# Patient Record
Sex: Male | Born: 1940 | Race: White | Hispanic: No | Marital: Married | Smoking: Former smoker
Health system: Southern US, Community
[De-identification: ages and names within clinical notes are randomized; demographics above are authoritative.]

## PROBLEM LIST (undated history)

## (undated) DIAGNOSIS — Z5189 Encounter for other specified aftercare: Secondary | ICD-10-CM

## (undated) DIAGNOSIS — N529 Male erectile dysfunction, unspecified: Secondary | ICD-10-CM

## (undated) DIAGNOSIS — I2510 Atherosclerotic heart disease of native coronary artery without angina pectoris: Secondary | ICD-10-CM

## (undated) DIAGNOSIS — K859 Acute pancreatitis without necrosis or infection, unspecified: Secondary | ICD-10-CM

## (undated) DIAGNOSIS — E785 Hyperlipidemia, unspecified: Secondary | ICD-10-CM

## (undated) DIAGNOSIS — K219 Gastro-esophageal reflux disease without esophagitis: Secondary | ICD-10-CM

## (undated) DIAGNOSIS — F0390 Unspecified dementia without behavioral disturbance: Secondary | ICD-10-CM

## (undated) DIAGNOSIS — D509 Iron deficiency anemia, unspecified: Secondary | ICD-10-CM

## (undated) DIAGNOSIS — J439 Emphysema, unspecified: Secondary | ICD-10-CM

## (undated) DIAGNOSIS — A048 Other specified bacterial intestinal infections: Secondary | ICD-10-CM

## (undated) DIAGNOSIS — I10 Essential (primary) hypertension: Secondary | ICD-10-CM

## (undated) DIAGNOSIS — K449 Diaphragmatic hernia without obstruction or gangrene: Secondary | ICD-10-CM

## (undated) DIAGNOSIS — K2970 Gastritis, unspecified, without bleeding: Secondary | ICD-10-CM

## (undated) DIAGNOSIS — E538 Deficiency of other specified B group vitamins: Secondary | ICD-10-CM

## (undated) DIAGNOSIS — N200 Calculus of kidney: Secondary | ICD-10-CM

## (undated) DIAGNOSIS — IMO0001 Reserved for inherently not codable concepts without codable children: Secondary | ICD-10-CM

## (undated) DIAGNOSIS — E669 Obesity, unspecified: Secondary | ICD-10-CM

## (undated) DIAGNOSIS — F17200 Nicotine dependence, unspecified, uncomplicated: Secondary | ICD-10-CM

## (undated) DIAGNOSIS — K5790 Diverticulosis of intestine, part unspecified, without perforation or abscess without bleeding: Secondary | ICD-10-CM

## (undated) HISTORY — DX: Gastritis, unspecified, without bleeding: K29.70

## (undated) HISTORY — DX: Obesity, unspecified: E66.9

## (undated) HISTORY — DX: Hyperlipidemia, unspecified: E78.5

## (undated) HISTORY — DX: Deficiency of other specified B group vitamins: E53.8

## (undated) HISTORY — DX: Iron deficiency anemia, unspecified: D50.9

## (undated) HISTORY — DX: Essential (primary) hypertension: I10

## (undated) HISTORY — DX: Diaphragmatic hernia without obstruction or gangrene: K44.9

## (undated) HISTORY — DX: Nicotine dependence, unspecified, uncomplicated: F17.200

## (undated) HISTORY — DX: Emphysema, unspecified: J43.9

## (undated) HISTORY — PX: KNEE CARTILAGE SURGERY: SHX688

## (undated) HISTORY — DX: Encounter for other specified aftercare: Z51.89

## (undated) HISTORY — DX: Diverticulosis of intestine, part unspecified, without perforation or abscess without bleeding: K57.90

## (undated) HISTORY — DX: Other specified bacterial intestinal infections: A04.8

## (undated) HISTORY — DX: Gastro-esophageal reflux disease without esophagitis: K21.9

## (undated) HISTORY — DX: Atherosclerotic heart disease of native coronary artery without angina pectoris: I25.10

## (undated) HISTORY — DX: Unspecified dementia without behavioral disturbance: F03.90

## (undated) HISTORY — DX: Reserved for inherently not codable concepts without codable children: IMO0001

## (undated) HISTORY — DX: Calculus of kidney: N20.0

## (undated) HISTORY — DX: Male erectile dysfunction, unspecified: N52.9

## (undated) HISTORY — DX: Acute pancreatitis without necrosis or infection, unspecified: K85.90

## (undated) HISTORY — PX: COLONOSCOPY: SHX174

---

## 1999-01-19 ENCOUNTER — Encounter: Payer: Self-pay | Admitting: Cardiology

## 1999-01-19 ENCOUNTER — Ambulatory Visit (HOSPITAL_COMMUNITY): Admission: RE | Admit: 1999-01-19 | Discharge: 1999-01-20 | Payer: Self-pay | Admitting: Cardiology

## 1999-01-19 HISTORY — PX: CARDIAC CATHETERIZATION: SHX172

## 1999-09-02 ENCOUNTER — Inpatient Hospital Stay (HOSPITAL_COMMUNITY): Admission: EM | Admit: 1999-09-02 | Discharge: 1999-09-05 | Payer: Self-pay | Admitting: *Deleted

## 1999-09-02 ENCOUNTER — Encounter: Payer: Self-pay | Admitting: *Deleted

## 1999-09-03 ENCOUNTER — Encounter: Payer: Self-pay | Admitting: *Deleted

## 1999-09-03 ENCOUNTER — Encounter: Payer: Self-pay | Admitting: Family Medicine

## 1999-09-06 ENCOUNTER — Encounter: Admission: RE | Admit: 1999-09-06 | Discharge: 1999-09-06 | Payer: Self-pay | Admitting: Family Medicine

## 2003-01-25 LAB — HM COLONOSCOPY

## 2009-12-18 ENCOUNTER — Ambulatory Visit: Payer: Self-pay | Admitting: Cardiology

## 2009-12-18 ENCOUNTER — Encounter (INDEPENDENT_AMBULATORY_CARE_PROVIDER_SITE_OTHER): Payer: Self-pay | Admitting: *Deleted

## 2009-12-18 DIAGNOSIS — I2510 Atherosclerotic heart disease of native coronary artery without angina pectoris: Secondary | ICD-10-CM | POA: Insufficient documentation

## 2009-12-19 ENCOUNTER — Encounter: Payer: Self-pay | Admitting: Cardiology

## 2010-02-08 NOTE — Letter (Signed)
Summary: [facility]  [facility]   Imported By: Faythe Ghee 12/19/2009 10:24:18  _____________________________________________________________________  External Attachment:    Type:   Image     Comment:   External Document

## 2010-02-08 NOTE — Letter (Signed)
Summary: Clearance Letter  [facility]  [address] [place] [address]   Phone: [phone]  Fax: [phone]    December 18, 2009  Re:     [facility] Address:   [address]     [place], [place]  [address] DOB:     [birth year] MRN:     [id number]   Dear Dr. Arthur Holms:   Mr. Moccia was in the office for a visit on 12/18/2009.  Is has been   given cardiology clearance for his orthroscopic knee surgery with low   cardiac risk.  Please feel free to consult [place] further if needed during   his surgical procedure.           Sincerely,    Dr. Valera Castle  MD, [facility]

## 2010-02-08 NOTE — Assessment & Plan Note (Signed)
Summary: **np6/surgical clearance/knee surgery/   Visit Type:  Pre-op Evaluation Primary Provider:  Dr.Mary beth dickson  CC:  right knee surgery Dr.Noris.  History of Present Illness: Mr Pang he is referred today by Dr. Ranell Patrick of orthopedic surgery for preoperative clearance 4 right arthroscopic knee surgery.  He is 70 years of age. He is very active playing golf. He has a history of coronary artery disease by catheterization January 19, 1999. His ejection fraction at that time was 55%, he had proximal and mid calcification of the LAD with nonobstructive plaque, subtotal occlusion of the distal segment of the circumflex before long posterior lateral branch, and the right coronary artery was dominant and free of significant disease. He is been managed medically all these years and has done remarkably well. He has had no angina or ischemic equivalence. He continues to smoke an occasional cigar when playing golf. He is overweight slightly, has hyperlipidemia but is on a statin, and has diabetes well controlled. He does not carry or use any sublingual nitroglycerin.  He denies orthopnea, PND or edema. He denies any palpitations presyncope or syncope.  Current Medications (verified): 1)  Aspirin 325 Mg Tabs (Aspirin) .... Take 1 Tab Daily 2)  Lipitor 80 Mg Tabs (Atorvastatin Calcium) .... Take 1 Tab Daily 3)  Enalapril Maleate 20 Mg Tabs (Enalapril Maleate) .... Take 1 Tab Daily 4)  Glucophage 500 Mg Tabs (Metformin Hcl) .... Take 1 Tab Two Times A Day 5)  Protonix 40 Mg Tbec (Pantoprazole Sodium) .... Take 1 Tab Daily 6)  Glipizide 10 Mg Tabs (Glipizide) .... Take 1 Tab Daily 7)  Diovan 320 Mg Tabs (Valsartan) .... Take 1 Tab Daily 8)  Actos 45 Mg Tabs (Pioglitazone Hcl) .... Take 1tab Daily 9)  Amlodipine Besylate 10 Mg Tabs (Amlodipine Besylate) .... Take 1 Tab Daily 10)  Proair Hfa 108 (90 Base) Mcg/act Aers (Albuterol Sulfate) .... Use As Needed 11)  Hydroxyzine Hcl 50 Mg Tabs  (Hydroxyzine Hcl) .... Take As Needed 12)  Nitrostat 0.4 Mg Subl (Nitroglycerin) .Marland Kitchen.. 1 Tablet Under Tongue At Onset of Chest Pain; You May Repeat Every 5 Minutes For Up To 3 Doses.  Allergies (verified): No Known Drug Allergies  Past History:  Past Medical History: Last updated: December 23, 2009 diabetes hypercholesterolemia copd obesity nephrolithiasis heart cath(one vessel coronary artery disease) gerds copd  Past Surgical History: Last updated: 23-Dec-2009 heart catherization one vessel coronary artery disease  Family History: Last updated: 23-Dec-2009 Father:died of emphysemia Mother:alive 2 myocardial  infarctions coronary artery bypass graft   Social History: Last updated: 2009/12/23 Full Time Married  Tobacco Use - Yes.  Alcohol Use - no Regular Exercise - no Drug Use - no  Risk Factors: Exercise: no (23-Dec-2009)  Risk Factors: Smoking Status: current (12-23-2009)  Review of Systems       negative other than history of present illness  Vital Signs:  Patient profile:   70 year old male Height:      69 inches Weight:      209 pounds BMI:     30.98 O2 Sat:      97 % on Room air Pulse rate:   99 / minute BP sitting:   139 / 72  (right arm)  Vitals Entered By: Dreama Saa, CNA (2009-12-23 12:30 PM)  O2 Flow:  Room air  Physical Exam  General:  no acute distress, slightly overweight Head:  normocephalic and atraumatic Eyes:  PERRLA/EOM intact; conjunctiva and lids normal. Chest Wall:  no deformities  or breast masses noted Lungs:  Clear bilaterally to auscultation and percussion. Heart:  PMI not displaced, normal S1-S2, no murmur rub or gallop. No carotid bruits Abdomen:  soft, good bowel sounds, no tenderness, no midline or flank bruit Msk:  decreased ROM.   Pulses:  pulses normal in all 4 extremities Extremities:  No clubbing or cyanosis. Neurologic:  Alert and oriented x 3. Skin:  Intact without lesions or rashes. Psych:  Normal  affect.   Problems:  Medical Problems Added: 1)  Dx of Cad, Native Vessel  (ICD-414.01)  EKG  Procedure date:  12/18/2009  Findings:      normal sinus rhythm, normal EKG  Impression & Recommendations:  Problem # 1:  CAD, NATIVE VESSEL (ICD-414.01) He has been remarkably stable since 2001. He has no symptoms of angina or ischemia. He is secondary risk factors being well treated. He does occasionally have a cigar. I've asked her to carry sublingual nitroglycerin or at least keep at home. We have spent a lot of time talking about symptoms of exertional angina, unstable angina, or acute coronary syndrome and how to manage it with nitroglycerin and aspirin as well as calling 911. I have cleared him at low operative risk for surgery. He may discontinue his aspirin for the procedure. He should start back shortly thereafter. I'll see him back on an annual basis. His updated medication list for this problem includes:    Aspirin 325 Mg Tabs (Aspirin) .Marland Kitchen... Take 1 tab daily    Enalapril Maleate 20 Mg Tabs (Enalapril maleate) .Marland Kitchen... Take 1 tab daily    Amlodipine Besylate 10 Mg Tabs (Amlodipine besylate) .Marland Kitchen... Take 1 tab daily    Nitrostat 0.4 Mg Subl (Nitroglycerin) .Marland Kitchen... 1 tablet under tongue at onset of chest pain; you may repeat every 5 minutes for up to 3 doses.  Patient Instructions: 1)  Your physician recommends that you schedule a follow-up appointment in: 1 year 2)  Your physician has recommended you make the following change in your medication: nitroglycerin   1 tablet under tongue at onset of chest pain; you may repeat every 5 minutes for up to 3 doses. Prescriptions: NITROSTAT 0.4 MG SUBL (NITROGLYCERIN) 1 tablet under tongue at onset of chest pain; you may repeat every 5 minutes for up to 3 doses.  #25 x 1   Entered by:   Teressa Lower RN   Authorized by:   Gaylord Shih, MD, [facility]   Signed by:   Teressa Lower RN on 12/18/2009   Method used:   Faxed to ...       [facility]       [address]       [place], [place]  [address]       Ph: [phone] or [phone]       Fax: [phone]   RxID:   [id number]

## 2010-04-04 ENCOUNTER — Encounter: Payer: Self-pay | Admitting: Physician Assistant

## 2010-04-04 DIAGNOSIS — E559 Vitamin D deficiency, unspecified: Secondary | ICD-10-CM | POA: Insufficient documentation

## 2010-05-08 DIAGNOSIS — K2970 Gastritis, unspecified, without bleeding: Secondary | ICD-10-CM

## 2010-05-08 DIAGNOSIS — A048 Other specified bacterial intestinal infections: Secondary | ICD-10-CM

## 2010-05-08 DIAGNOSIS — K449 Diaphragmatic hernia without obstruction or gangrene: Secondary | ICD-10-CM

## 2010-05-08 HISTORY — DX: Gastritis, unspecified, without bleeding: K29.70

## 2010-05-08 HISTORY — DX: Diaphragmatic hernia without obstruction or gangrene: K44.9

## 2010-05-08 HISTORY — DX: Other specified bacterial intestinal infections: A04.8

## 2010-05-25 NOTE — Discharge Summary (Signed)
[place]. [facility]  Patient:    Jonathan [name], Jonathan [name]                      MRN: [id number] Adm. Date:  [id number] Disc. Date: [id number] Attending:  Willow Ora Dictator:   Kinnie Scales. Reed Breech, M.D. CC:         Elvina Sidle, M.D.                           Discharge Summary  DISCHARGE DIAGNOSES: 1. Pancreatitis. 2. Diabetes. 3. Back pain. 4. Hypercholesterolemia. 5. Chronic obstructive pulmonary disease. 6. Nephrolithiasis. 7. History of one-vessel coronary artery disease with ejection fraction of    55%. 8. Family history of coronary artery disease.  DISCHARGE MEDICATIONS: 1. Percocet one p.o. q.4-6h. p.r.n. pain. 2. Lipitor 20 mg one p.o. q.d. 3. Vasotec 5 mg p.o. q.d. 4. Glucophage 500 mg p.o. b.i.d. 5. Aspirin 325 mg p.o. q.d. 6. Protonix 40 mg one p.o. q.d.  CONSULTATIONS:  None.  PROCEDURES: 1. Abdominal CT showing edematous pancreas, node dissection. 2. Abdominal ultrasound showed no gallstones or other biliary abnormalities,    small segment of head/body pancreas were visualized, appeared to be    unremarkable by ultrasound.  FOLLOWUP:  The patient will be seen at [facility] in the next one to two weeks by Dr. Milus Glazier or Christell Constant.  ADMISSION HISTORY AND PHYSICAL:  The patient is a 70 year old white male with a past medical history of hypercholesterolemia, diabetes, asthma/COPD, obesity, and cigarette abuse presented with a 30 hour history of very sharp back pain with some mild epigastric discomfort.  While being worked up the patient was noted to have elevated lipase at 240, and was noted on physical exam to be in quite severe pain.  The patient received morphine in the emergency room, and this caused the pain to subside.  The patient denied any chest pain, discomfort, dyspnea.  The patient was admitted with the diagnosis of pancreatitis.  HOSPITAL COURSE:  #1 - BACK/ABDOMINAL PAIN:  The patients pain continued to be  problematic on day #2 of hospitalization, and thus a CT scan was performed to rule out a possible dissection or small bowel perforation.  The patient was noted to have nephrolithiasis, but asymptomatic from this.  Since the patient does take Glucophage he was instructed not to take this 2 to 3 days after getting his IV contrast.  The patient was kept n.p.o. until the night prior to discharge.  His diet was advanced.  He tolerated this well, not requiring much pain medication, and he tolerated a full diet well.  The patient will be discharged with pain medication.  #2 - DIABETES/HYPERTENSION:  The patient takes Vasotec, aspirin, and Glucophage.  Initially these were held while hospitalized.  Aspirin and Vasotec were restarted.  The patient is to continue these as an outpatient.  #3 - DIABETES:  The patient takes Glucophage 500 mg b.i.d. if tolerable.  The patient should have this advanced to 1500 or 2000 to find better benefit.  The patients hemoglobin A1C was found to be 7.0.  #4 - HYPERLIPIDEMIA:  A lipid panel fasting was performed and total cholesterol was 172, triglycerides were 279, HDL was low at 32, and LDL was 84.  #5 - BACK PAIN/CHEST PAIN:  The patient had initial troponin which was less than 0.03, which is normal limits.  It was deemed that he was not having a recurrence  of his coronary artery disease and chest pain.  As far as feeds, the patient tolerated diet well.  Ready to go home.  FOLLOWUP:  Dr. Milus Glazier as an outpatient. DD:  09/05/99 TD:  09/06/99 Job: [id number]

## 2010-05-25 NOTE — Discharge Summary (Signed)
[place]. [facility]  Patient:    Jonathan [name]                       MRN: [id number] Adm. Date:  [id number] Disc. Date: 01/20/99 Attending:  Rollene Rotunda Dictator:   Dian Queen, P.A.C. [facility] CC:         Dr. Rudi Heap, [facility], [facility]                  Referring Physician Discharge [place]  HISTORY OF PRESENT ILLNESS:  Essentially, is that of a 70 year old white male, private patient of Dr. Christell Constant, who saw Dr. Antoine Poche in January 2001 with chest discomfort, suspicious for exertional angina.  Cardiac catheterization was suggested, and he was brought in for such on January 19, 1999.  This was done by Dr. Antoine Poche without complication, and revealed a totally occluded distal circumflex, 25% LAD in the proximal and mid portion.  The right coronary artery was nondominant.  His ejection fraction was well-preserved at 55%.  Dr. Gerri Spore attempted to cross the lesion but could not pass the wire, and therefore, intervention was impossible.  We therefore opted for medical therapy and risk factor modification.  Patient tolerated the procedure well, and was discharged home the following day.  FINAL DIAGNOSES: 1. One-vessel coronary artery disease (totalled circumflex - we attempted    recanalization but could not pass the wire and therefore plan medical    therapy). 2. Diabetes. 3. Cigarette use. 4. Hyperlipoproteinemia - on Lipitor.  DISPOSITION:  MEDICATIONS:  We added Vasotec 5 mg b.i.d. to his regimen.  He will use nitroglycerin p.r.n.  Hopefully, with time, he will develop collaterals.  FOLLOW-UP:  With Dr. Antoine Poche in a couple weeks, and thereafter with Dr. Vernon Prey. DD:  01/20/99 TD:  01/20/99 Job: [id number]

## 2010-05-25 NOTE — Cardiovascular Report (Signed)
[place]. [facility]  Patient:    Jonathan [name]                       MRN: [id number] Proc. Date: 01/19/99 Adm. Date:  [id number] Attending:  Rollene Rotunda CC:         Bertram Millard. Hyacinth Meeker, M.D.             Rollene Rotunda, M.D. [facility]             Cardiac Catheterization Laboratory                        Cardiac Catheterization  PROCEDURES PERFORMED:  Attempted percutaneous transluminal coronary angioplasty of the distal left circumflex.  INDICATIONS:  [name] is a 70 year old male with recent onset of exertional  angina.  Cardiac catheterization today by Dr. Antoine Poche revealed the presence of a total occlusion in the distal dominant left circumflex just before a posterior descending artery.  The length of the occlusion appeared to be approximately 20 mm. There was a posterolateral branch arising just proximal to the occlusion. After reviewing the films, we opted to proceed with an attempt at percutaneous intervention.  DESCRIPTION OF PROCEDURE:  A preexisting 6 French sheath in the right femoral artery was exchanged over a wire for a 7 [place] sheath.  A 7 [place] guiding  catheter was engaged in the left coronary ostium.  We initially attempted to cross a lesion with a BMW wire.  We used a 2.5 x 20 mm Ninja balloon as backup support for the wire.  This wire would not cross the lesion.  We then used a Choice PT ire which also failed to cross the lesion.  Finally, we used a Choice PT Graphix intermediate wire, again with backup support with the angioplasty balloon and this also failed to cross the lesion.  At that point we opted to end the procedure. The patient tolerated this well and there were no complications.  At the conclusion of the case, a Perclose vascular closure device was placed in the right femoral artery.  Heparin and Integrilin had been administered per protocol.  Both of these were stopped at the conclusion of the  procedure.  COMPLICATIONS:  None.  RESULTS:  Unsuccessful attempted angioplasty of the distal circumflex due to inability to cross a totally occluded vessel with a wire.  PLAN:  The patient will be observed overnight.  He will be treated medically for his one-vessel coronary artery disease. DD:  01/19/99 TD:  01/20/99 Job: [id number]

## 2010-05-25 NOTE — Cardiovascular Report (Signed)
[name]. [facility]  Patient:    Jonathan [name]                       MRN: [id number] Proc. Date: 01/19/99 Adm. Date:  [id number] Attending:  Rollene Rotunda CC:         Bertram Millard. Hyacinth Meeker, M.D.             Rollene Rotunda, M.D. [facility]                        Cardiac Catheterization  DATE OF BIRTH:  [birth year]  PRIMARY PHYSICIAN:  Dr. Bertram Millard. [facility].  CARDIOLOGIST:  Dr. Antoine Poche.  PROCEDURE:  Left heart cardiac catheterization, coronary arteriography.  INDICATIONS:  Evaluate patient with exertional chest pain and multiple cardiovascular risk factors (786.5).  DESCRIPTION OF PROCEDURE:  Left heart catheterization was performed via the right femoral artery.  The artery was cannulated using an anterior wall puncture.  A 6 French arterial sheath was inserted via the modified Seldinger technique. Preformed Judkins and a pigtail catheter were utilized.  The patient tolerated he procedure well and left the lab in stable condition.  RESULTS:  HEMODYNAMICS:  LV 157/16, AO 158/73.  CORONARY ARTERIOGRAPHY:  Left main:  The left main coronary artery was normal.  Left anterior descending:  The LAD had tortuous proximal and mid segment with calcification.  There was an ostial 25% stenosis and a mid 25% stenosis.  Circumflex:  The circumflex coronary artery was a large dominant vessel.  It was subtotaled in distal segment before a long posterolateral.  Right coronary artery:  The right coronary artery was nondominant and free of significant disease.  LEFT VENTRICULOGRAM:  The left ventriculogram was obtained in the RAO projection. The EF was approximately 55%.  CONCLUSION:  Severe single-vessel coronary artery disease of the subtotaled circumflex.  There was also moderate plaquing and calcification in the mid LAD.   PLAN:  The patient will have percutaneous revascularization of his circumflex. He will have  aggressive secondary risk factor modification. DD:  01/19/99 TD:  01/20/99 Job: [id number]

## 2010-05-25 NOTE — H&P (Signed)
[place]. [facility]  Patient:    Jonathan [name], Jonathan [name]                      MRN: [id number] Adm. Date:  [id number] Attending:  Willow Ora Dictator:   Kinnie Scales. Reed Breech, M.D. CC:         Monica Becton, M.D., [facility]                         History and Physical  CHIEF COMPLAINT:  Back pain.  HISTORY OF PRESENT ILLNESS:  This 70 year old white male with a past medical history of hypercholesterolemia, diabetes, asthma and obesity and a long history of cigarette abuse, presents today with a one-day history of sharp pain in his abdomen and upper back.  He said the pain also is in left and right lower quadrants at times.  He said the pain does not change with movement and there is nothing he has found to relieve the pain.  He denies emesis, diarrhea -- no melena or hematochezia -- but he has had nausea.  He denies any trauma or motor vehicle collision to this area and he denies any alcohol abuse.  He states that he had a recent checkup with Dr. Rollene Rotunda from cardiology and all his labs at that time were normal.  He was told to come back in a year.  Patient underwent a catheterization in January 2001 and noted to have one-vessel coronary artery disease.  He denies any cold or flu symptoms recently.  He denies any dysuria or fever.  ALLERGIES:  No known medical allergies.  SURGICAL HISTORY:  None.  PAST MEDICAL HISTORY:  Diabetes; hypercholesterolemia; COPD; obesity; nephrolithiasis; heart catheterization, one-vessel coronary artery disease, left circumflex, ejection fraction 55% in January 2001.  Patient has GERD.  SOCIAL HISTORY:  Patient is married, is a greater than 60-pack-year-history smoker, denies any alcohol use and works as a Production designer, theatre/television/film person at a [employer].  FAMILY HISTORY:  Mother had MIs x 2 and underwent CABG.  Three uncles with heart disease and MIs.  Brother had an MI.  His father died of emphysema  and diabetes mellitus.  MEDICATIONS 1. Aspirin 325 mg p.o. q.d. 2. Lipitor 20 mg p.o. q.d. 3. Vasotec 5 mg p.o. q.d. 4. Glucophage 500 mg p.o. b.i.d.  REVIEW OF SYSTEMS:  HEENT, respiratory and cardiac all negative, as mentioned above.  ABDOMEN:  Positive nausea.  No vomiting.  No distention or blood per rectum.  EXTREMITIES:  No muscle myalgias, weakness or edema.  PHYSICAL EXAMINATION  VITAL SIGNS:  Blood pressure 149/96.  Pulse 65.  Respiratory rate 18. Temperature 97.9.  O2 saturation 94% on room air.  GENERAL:  Alert, in no acute distress.  HEENT:  PERRL.  Oropharynx nonerythematous.  NECK:  No lymphadenopathy.  CHEST:  Clear to auscultation bilaterally.  HEART:  Regular rate and rhythm without murmurs.  ABDOMEN:  Soft, nondistended.  Positive bowel sounds.  Slightly tender in epigastric region of stomach.  Negative Turner and Cullen signs.  Positive bowel sounds.  No rebound tenderness.  No hepatosplenomegaly.  EXTREMITIES:  No cyanosis, clubbing or edema.  LABORATORY AND X-RAY FINDINGS:  Chest x-ray showed no acute disease, COPD changes.  WBC 14.7, hemoglobin 14.7, platelets 401,000.  Sodium 135, potassium 3.7, chloride 102, bicarb 25, BUN 15, creatinine 0.9, platelets 148, calcium 9.0, total protein 7.1, albumin 3.7, AST 12, ALT 16, alkaline phosphatase  92, total bilirubin 0.3.  Lipase 240.  Troponin I 0.03.  ASSESSMENT:  This 70 year old male with diabetes, one-vessel coronary artery disease and apparent acute pancreatitis. 1. Abdominal pain, most likely pancreatitis secondary to cholelithiasis, but    not uncommon with diabetes, hypercholesterolemia.  Patient denies any    alcohol use.  Other possibilities could be a viral pancreatitis versus drug    induced or hypertriglyceridemia, but these are less likely.  Other    considerations are peptic ulcer disease, nephrolithiasis, inferior    myocardial infarction, lower lobe pneumonia, but unlikely to be a     gastrointestinal tract obstruction, small bowel ______ and positive bowel    sounds.  Will make patient n.p.o., order abdominal ultrasound, fasting    lipid profile and recheck his laboratory data in the morning for further    evaluation of this.  Will also treat him with Protonix intravenously. 2. Diabetes:  We will hold off on his Glucophage while he is n.p.o. and start    his A1c to determine if he needs acceleration of his Glucophage. 3. Chronic obstructive pulmonary disease:  Will continue his albuterol and    start him on Atrovent. 4. Will check a fasting lipid profile. 5. Fluids/electrolytes/nutrition:  Will make him n.p.o. DD:  09/03/99 TD:  09/03/99 Job: [id number]

## 2010-06-02 ENCOUNTER — Inpatient Hospital Stay (HOSPITAL_COMMUNITY)
Admission: EM | Admit: 2010-06-02 | Discharge: 2010-06-03 | DRG: 812 | Disposition: A | Discharge status: Home / Self Care | Payer: Medicare Other | Attending: Internal Medicine | Admitting: Internal Medicine

## 2010-06-02 DIAGNOSIS — J4489 Other specified chronic obstructive pulmonary disease: Secondary | ICD-10-CM | POA: Diagnosis present

## 2010-06-02 DIAGNOSIS — E785 Hyperlipidemia, unspecified: Secondary | ICD-10-CM | POA: Diagnosis present

## 2010-06-02 DIAGNOSIS — D509 Iron deficiency anemia, unspecified: Secondary | ICD-10-CM

## 2010-06-02 DIAGNOSIS — E119 Type 2 diabetes mellitus without complications: Secondary | ICD-10-CM | POA: Diagnosis present

## 2010-06-02 DIAGNOSIS — Z9861 Coronary angioplasty status: Secondary | ICD-10-CM

## 2010-06-02 DIAGNOSIS — I2510 Atherosclerotic heart disease of native coronary artery without angina pectoris: Secondary | ICD-10-CM | POA: Diagnosis present

## 2010-06-02 DIAGNOSIS — E538 Deficiency of other specified B group vitamins: Secondary | ICD-10-CM

## 2010-06-02 DIAGNOSIS — F17200 Nicotine dependence, unspecified, uncomplicated: Secondary | ICD-10-CM | POA: Diagnosis present

## 2010-06-02 DIAGNOSIS — J449 Chronic obstructive pulmonary disease, unspecified: Secondary | ICD-10-CM | POA: Diagnosis present

## 2010-06-02 HISTORY — DX: Deficiency of other specified B group vitamins: E53.8

## 2010-06-02 HISTORY — DX: Iron deficiency anemia, unspecified: D50.9

## 2010-06-02 LAB — COMPREHENSIVE METABOLIC PANEL
ALT: 19 U/L (ref 0–53)
AST: 20 U/L (ref 0–37)
Albumin: 3.6 g/dL (ref 3.5–5.2)
CO2: 24 mEq/L (ref 19–32)
Chloride: 105 mEq/L (ref 96–112)
Creatinine, Ser: 1.06 mg/dL (ref 0.4–1.5)
GFR calc Af Amer: >60 mL/min (ref >60)
GFR calc non Af Amer: >60 mL/min (ref >60)
Potassium: 3.9 mEq/L (ref 3.5–5.1)
Sodium: 139 mEq/L (ref 135–145)
Total Bilirubin: 0.3 mg/dL (ref 0.3–1.2)

## 2010-06-02 LAB — GLUCOSE, CAPILLARY: Glucose-Capillary: 155 mg/dL — ABNORMAL HIGH (ref 70–99)

## 2010-06-02 LAB — URINALYSIS, ROUTINE W REFLEX MICROSCOPIC
Glucose, UA: NEGATIVE mg/dL
Hgb urine dipstick: NEGATIVE
Protein, ur: NEGATIVE mg/dL
Specific Gravity, Urine: 1.018 (ref 1.005–1.030)
pH: 5.5 (ref 5.0–8.0)

## 2010-06-02 LAB — FERRITIN: Ferritin: 5 ng/mL — ABNORMAL LOW (ref 22–322)

## 2010-06-02 LAB — CBC
Platelets: 589 10*3/uL — ABNORMAL HIGH (ref 150–400)
RDW: 19.8 % — ABNORMAL HIGH (ref 11.5–15.5)
WBC: 8.5 10*3/uL (ref 4.0–10.5)

## 2010-06-02 LAB — DIFFERENTIAL
Basophils Absolute: 0.1 10*3/uL (ref 0.0–0.1)
Lymphs Abs: 1.6 10*3/uL (ref 0.7–4.0)
Monocytes Absolute: 0.5 10*3/uL (ref 0.1–1.0)
Monocytes Relative: 6 % (ref 3–12)

## 2010-06-02 LAB — PROTIME-INR: Prothrombin Time: 12.5 seconds (ref 11.6–15.2)

## 2010-06-02 LAB — IRON AND TIBC
Iron: <10 ug/dL — ABNORMAL LOW (ref 42–135)
UIBC: >450 ug/dL

## 2010-06-03 ENCOUNTER — Encounter: Payer: Self-pay | Admitting: Internal Medicine

## 2010-06-03 DIAGNOSIS — D509 Iron deficiency anemia, unspecified: Secondary | ICD-10-CM | POA: Insufficient documentation

## 2010-06-03 DIAGNOSIS — D518 Other vitamin B12 deficiency anemias: Secondary | ICD-10-CM

## 2010-06-03 DIAGNOSIS — E538 Deficiency of other specified B group vitamins: Secondary | ICD-10-CM

## 2010-06-03 LAB — GLUCOSE, CAPILLARY: Glucose-Capillary: 78 mg/dL (ref 70–99)

## 2010-06-03 LAB — BASIC METABOLIC PANEL
CO2: 25 mEq/L (ref 19–32)
Chloride: 103 mEq/L (ref 96–112)
GFR calc non Af Amer: >60 mL/min (ref >60)
Glucose, Bld: 83 mg/dL (ref 70–99)
Potassium: 3.5 mEq/L (ref 3.5–5.1)
Sodium: 139 mEq/L (ref 135–145)

## 2010-06-03 LAB — HEMOGLOBIN A1C
Hgb A1c MFr Bld: 6.1 % — ABNORMAL HIGH (ref <5.7)
Mean Plasma Glucose: 128 mg/dL — ABNORMAL HIGH (ref <117)

## 2010-06-03 LAB — CBC
HCT: 29.7 % — ABNORMAL LOW (ref 39.0–52.0)
Hemoglobin: 8.9 g/dL — ABNORMAL LOW (ref 13.0–17.0)
Hemoglobin: 10.5 g/dL — ABNORMAL LOW (ref 13.0–17.0)
MCH: 22.3 pg — ABNORMAL LOW (ref 26.0–34.0)
MCHC: 30.6 g/dL (ref 30.0–36.0)
MCV: 71.9 fL — ABNORMAL LOW (ref 78.0–100.0)
MCV: 72.8 fL — ABNORMAL LOW (ref 78.0–100.0)
Platelets: 472 10*3/uL — ABNORMAL HIGH (ref 150–400)
RBC: 4.13 MIL/uL — ABNORMAL LOW (ref 4.22–5.81)
WBC: 10.7 10*3/uL — ABNORMAL HIGH (ref 4.0–10.5)

## 2010-06-03 LAB — LIPID PANEL
LDL Cholesterol: 62 mg/dL (ref 0–99)
Total CHOL/HDL Ratio: 2.9 RATIO

## 2010-06-03 LAB — TSH: TSH: 1.165 u[IU]/mL (ref 0.350–4.500)

## 2010-06-03 NOTE — H&P (Signed)
Jonathan [name], Jonathan [name]               ACCOUNT NO.:  [id number]  MEDICAL RECORD NO.:  [id number]           PATIENT TYPE:  I  LOCATION:  1530                         FACILITY:  [facility]  PHYSICIAN:  Isidor Holts, M.D.  DATE OF BIRTH:  11-28-1940  DATE OF ADMISSION:  06/02/2010 DATE OF DISCHARGE:                             HISTORY & PHYSICAL   PRIMARY MD:  Ernestina Penna, M.D.  PRIMARY CARE PROVIDER:  [name], [name].  PRIMARY GASTROENTEROLOGIST:  [place].  CHIEF COMPLAINT:  Dizziness, shortness of breath on exertion and weakness for 1 month.  Also, abnormally low hemoglobin on blood work, done Jun 01, 2010.  HISTORY OF PRESENT ILLNESS:  This is a 70 year old male.  For past medical history, see below.  According to the patient, he regularly attends his primary MD's office for routine checks, since he is a diabetic.  He had a routine appointment on Jun 01, 2010 where he was examined, blood work done and he went home.  However, his primary MD's office called him at about 6:30 a.m. on Jun 02, 2010, to inform him that his hemoglobin was low.  He was then referred to the emergency department.  Hemoglobin in the emergency department was found to be 7.4. On detailed questioning, the patient admits to progressive weakness for approximately 1 month now, shortness of breath on exertion and gets more fatigued while playing golf, however denies chest pain.  Denies ankle swelling.  Denies passage of bright red blood per rectum or black stools.  Denies NSAID use.  PAST MEDICAL HISTORY: 1. Hypertension. 2. Dyslipidemia. 3. Type 2 diabetes mellitus. 4. Morbid obesity. 5. COPD. 6. Smoking history. 7. Coronary artery disease status post cardiac catheterization on     January 19, 1999 by Dr. Rollene Rotunda.  This showed single-vessel     coronary artery disease of left circumflex.  The patient is status     post failed attempted PTCA.  At that time, ejection fraction was     65%. 8.  Urolithiasis. 9. History of pancreatitis, May 2001. 10.Status post colonoscopy approximately 6 years ago, which showed     hemorrhoids.  ALLERGIES:  No known drug allergies.  MEDICATION HISTORY: 1. Metformin 1000 mg p.o. b.i.d. 2. Glyburide 10 mg p.o. daily. 3. Lipitor 80 mg p.o. daily. 4. Diovan 320 mg p.o. daily. 5. Actos 45 mg p.o. daily. 6. Amlodipine 10 mg p.o. daily. 7. ProAir HFA inhaler (90 mcg) 2 puffs p.r.n. for shortness of breath     and wheeze. 8. Aspirin 81 mg p.o. daily. 9. Hydroxyzine hydrochloride 50 mg p.o. p.r.n. for pruritus. 10.Enalapril 20 mg p.o. daily.  REVIEW OF SYSTEMS:  As per HPI and chief complaint.  The patient denies chest pain.  However, he admits to feeling occasionally cold and in the past 6 months or so has had to be prescribed hydroxyzine for generalized itching.  SOCIAL HISTORY:  The patient used to work in the [employer], in maintenance.  He is married.  Nondrinker.  Has no history of drug abuse. Used to smoke, quit in the 1990s, but however has been smoking surreptitiously from  time to time.  FAMILY HISTORY:  The patient's mother is deceased from coronary artery disease.  She was status post MI x2, status post CABG.  His father died from emphysema and complications of diabetes.  PHYSICAL EXAMINATION:  VITAL SIGNS:  Temperature 97.9, pulse 73 per minute and regular, respiratory rate 18, BP 117/82 mmHg, pulse oximeter 92% on room air. GENERAL:  The patient did not appear to be in obvious acute distress at time of this evaluation, alert, communicative, not short of breath at rest. HEENT:  Severe to moderate pallor, no jaundice.  Hydration status appears fair.  Throat is clear. NECK:  Supple.  JVP not seen.  No palpable lymphadenopathy.  No palpable goiter. CHEST:  Currently clear to auscultation.  No wheezes, no crackles. CARDIAC:  Heart sounds 1 and 2 heard and regular.  No murmurs. ABDOMEN:  Morbidly obese, soft, nontender.  No  palpable organomegaly. No palpable masses.  Normal bowel sounds. LOWER EXTREMITIES:  No pitting edema.  Palpable peripheral pulses. MUSCULOSKELETAL:  Mild osteoarthritic changes are noted. CENTRAL NERVOUS SYSTEM:  No focal neurologic deficits on gross examination.  INVESTIGATIONS:  CBC; WBC 8.5, hemoglobin 7.4, hematocrit 25.8, MCV 71.9, platelets 589.  INR 0.91, PTT 29 seconds.  Electrolytes; sodium 139, potassium 3.9, chloride 105, CO2 24, BUN 16, creatinine 1.06, glucose 145.  CBG 144.  LFTs are normal.  Urinalysis negative.  Fecal occult blood testing is negative.  ASSESSMENT AND PLAN: 1. Chronic microcytic anemia.  This is severe and symptomatic.  Fecal     occult blood testing is negative, and the patient is status post     colonoscopy approximately 6 years ago, which demonstrated     hemorrhoids.  We will check iron studies, transfuse 2-3 units PRBC.     The patient will likely need upper and lower GI endoscopy as     outpatient.  We will consult with [facility].  2. Hypertension.  We shall continue current antihypertensive     medications.  3. Chronic obstructive pulmonary disease.  This appears stable.  We     shall manage with p.r.n. bronchodilator inhaler/nebulizers.  4. Coronary artery disease.  This is asymptomatic.  5. Dyslipidemia.  The patient is on statin, which we shall continue.  Further management will depend on clinical course.     Isidor Holts, M.D.     CO/MEDQ  D:  06/02/2010  T:  06/02/2010  Job:  [id number]  cc:   Ernestina Penna, M.D. Fax: [phone]  [name], [name]  Rollene Rotunda, MD, [facility] [address]  [address] [place] [address]  [facility]  Electronically Signed by Isidor Holts M.D. on 06/03/2010 06:33:05 PM

## 2010-06-03 NOTE — Assessment & Plan Note (Signed)
Will start supplement here in hospital and arrange outpatient injections - will need at PCP [facility])

## 2010-06-03 NOTE — Progress Notes (Signed)
  Seen in hospital - details there  Will arrange outpatient colonoscopy/EGD and B12 injections

## 2010-06-03 NOTE — Assessment & Plan Note (Signed)
Cause not clear Follow-up TTG Ab re: celiac and arrange outpatient EGD/colonoscopy

## 2010-06-04 LAB — TYPE AND SCREEN
Unit division: 0
Unit division: 0

## 2010-06-05 ENCOUNTER — Telehealth: Payer: Self-pay

## 2010-06-05 LAB — TISSUE TRANSGLUTAMINASE, IGA: Tissue Transglutaminase Ab, IgA: 12.5 U/mL (ref <20)

## 2010-06-05 NOTE — Telephone Encounter (Signed)
Message copied by Annett Fabian on Tue Jun 05, 2010  8:40 AM ------      Message from: Iva Boop      Created: Sun Jun 03, 2010 12:27 PM      Regarding: needs EGD/colonoscopy       See documentation encounter and hospital records      Needs egd/colonoscopy and may need 1 B12 injection when he comes for previsit            Let's get a previsit week of May 29            Let me know who did 2005 colonoscopy and I can notify            If it was Dr. Corinda Gubler it will be me doing procedures

## 2010-06-05 NOTE — Telephone Encounter (Signed)
Patient is scheduled for a pre-visit tomorrow and a colon on Thursday

## 2010-06-05 NOTE — Discharge Summary (Signed)
Jonathan [name], Jonathan [name]               ACCOUNT NO.:  [id number]  MEDICAL RECORD NO.:  [id number]           PATIENT TYPE:  I  LOCATION:  1530                         FACILITY:  [facility]  PHYSICIAN:  Isidor Holts, M.D.  DATE OF BIRTH:  04/08/1940  DATE OF ADMISSION:  06/02/2010 DATE OF DISCHARGE:  06/03/2010                              DISCHARGE SUMMARY   PRIMARY PHYSICIAN:  Ernestina Penna, M.D. [employer], [address] [place].  PRIMARY CARE PROVIDER:  [name], [name]  PRIMARY GASTROENTEROLOGIST:  [place].  DISCHARGE DIAGNOSES: 1. Severe microcytic anemia, required transfusion 3 units PRBC during     this hospitalization. 2. Iron deficiency. 3. Vitamin B12 deficiency, newly diagnosed. 4. History of hemorrhoids on colonoscopy approximately 6 years ago. 5. Dyslipidemia. 6. Type 2 diabetes mellitus. 7. Chronic obstructive pulmonary disease. 8. Occasional smoker. 9. Coronary artery disease with left circumflex disease. 10.History of pancreatitis 2001. 11.Morbid obesity.  DISCHARGE MEDICATIONS: 1. Vitamin B12 1000 mcg IM monthly. 2. Folic acid 1 mg p.o. daily. 3. Nu-Iron 150 mg p.o. b.i.d. 4. Prilosec 20 mg p.o. daily. 5. Actos 45 mg p.o. daily. 6. Amlodipine 10 mg p.o. daily. 7. Diovan 320 mg p.o. daily. 8. Enalapril 20 mg p.o. daily. 9. Glipizide 10 mg p.o. daily. 10.Hydroxyzine 50 mg p.o. p.r.n. t.i.d. for itching. 11.Lipitor 80 mg p.o. daily. 12.Metformin 1000 mg p.o. b.i.d. 13.ProAir inhaler 1 puff p.r.n. q.4 h. for shortness of breath.  PROCEDURES:  None.  CONSULTATIONS:  Iva Boop, MD,FACG, Gastroenterologist.  ADMISSION HISTORY:  As in H and P notes of Jun 02, 2010, dictated by this MD. However, in brief, this is a 70 year old male, with known history of hypertension, dyslipidemia, type 2 diabetes mellitus, morbid obesity, COPD, occasional smoker, history of coronary artery disease status post cardiac catheterization  January, 2001, which showed single-vessel coronary artery disease of left circumflex status post PTCA at that time.  Ejection fraction 65%.  Also, urolithiasis, history of pancreatitis May 2001, status post colonoscopy approximately 6 years ago, which demonstrated hemorrhoids, presenting with a 1-week history of shortness of breath on exertion as well as weakness and easy fatigability.  The patient was seen at his primary care provider's office for routine check on Jun 01, 2010.  Blood draw was done and he was sent home.  His PMD called him in the a.m. of Jun 02, 2010, to inform him that hemoglobin was low, and he was referred to the emergency department, where his hemoglobin was found to be 7.4.  He was admitted further evaluation, investigation, and management.  CLINICAL COURSE: 1. Severe microcytic anemia.  The patient's hemoglobin was 7.4 at the     time of presentation, with an MCV of 7.1, hematocrit 25.8.     Hematinic studies were drawn, which showed the following findings:     Iron less than 10, ferritin 5, vitamin B12 174, serum folate 5.9.     The patient was transfused 2 units of PRBC on May 03, 2010,     resulting in a satisfactory bump in hemoglobin to 8.9 in a.m. of     Jun 03, 2010.  He was then transfused a further 1 unit of PRBC.     Improvement in hemoglobin level is anticipated.  2. Iron deficiency/B12 deficiency.  As described above, the patient's     iron level was less than 10, ferritin was 5, consistent with     profound iron deficiency, he has been started on iron supplements     accordingly, but this is clear that the patient will require GI     workup.  Incidentally, fecal occult blood testing was negative.     Dr. Stan Head, gastroenterologist, was called on consultation.     He has ordered a celiac panel i.e. tissue transglutaminase antibody     and IgA and has arranged to have the patient follow up in his     office for upper and lower GI endoscopy.  Vitamin B12 level was low     at 174, consistent with vitamin B12 deficiency.  The patient has     been commenced on vitamin B12 supplements, as well as folate     supplements, accordingly.  3. Dyslipidemia.  The patient continues on preadmission statin     treatment.  4. Type 2 diabetes mellitus.  The patient was euglycemic during the     course of this hospitalization, apart from a single episode of CBG     of 55 on Jun 02, 2010.  5. COPD.  There were no problems referable to this.  6. History of coronary artery disease.  The patient asymptomatic from     this viewpoint.  DISPOSITION:  The patient was on Jun 03, 2010 , asymptomatic.  There were no new issues.  He was considered clinically stable for discharge and therefore discharged accordingly.  Incidentally, his hemoglobin A1c was 6.1 and TSH was 1.165.  The patient was discharged on Jun 03, 2010.  ACTIVITY:  As tolerated.  Recommended to increase activity slowly.  DIET:  Heart-healthy/carbohydrate modified.  FOLLOWUP INSTRUCTIONS:  The patient is to follow up routinely with his primary care provider, [name], [name], follow up  Dr. Rudi Heap, his primary MD.  In addition, he is to follow up with Dr. Stan Head, [place] gastroenterologist, on a date to be determined.  Dr. Marvell Fuller office will contact the patient to schedule an appointment within 1-2 weeks of discharge.  All this has been communicated to the patient's spouse who verbalized understanding.     Isidor Holts, M.D.     CO/MEDQ  D:  06/03/2010  T:  06/04/2010  Job:  [id number]  cc:   Ernestina Penna, M.D. Fax: [phone]  Iva Boop, MD,FACG [facility] [address] [place], [place] [address]  [name], [name]  Electronically Signed by Isidor Holts M.D. on 06/05/2010 06:30:55 PM

## 2010-06-06 ENCOUNTER — Ambulatory Visit (AMBULATORY_SURGERY_CENTER): Payer: Medicare Other | Admitting: *Deleted

## 2010-06-06 VITALS — Ht 69.0 in | Wt 218.0 lb

## 2010-06-06 DIAGNOSIS — D509 Iron deficiency anemia, unspecified: Secondary | ICD-10-CM

## 2010-06-06 DIAGNOSIS — K921 Melena: Secondary | ICD-10-CM

## 2010-06-06 MED ORDER — PEG-KCL-NACL-NASULF-NA ASC-C 100 G PO SOLR: ORAL | Status: DC

## 2010-06-07 ENCOUNTER — Encounter: Payer: Self-pay | Admitting: Internal Medicine

## 2010-06-07 ENCOUNTER — Ambulatory Visit (AMBULATORY_SURGERY_CENTER): Payer: Medicare Other | Admitting: Internal Medicine

## 2010-06-07 VITALS — BP 155/79 | HR 77 | Temp 97.8°F | Resp 18 | Ht 69.0 in | Wt 215.0 lb

## 2010-06-07 DIAGNOSIS — K2940 Chronic atrophic gastritis without bleeding: Secondary | ICD-10-CM

## 2010-06-07 DIAGNOSIS — D649 Anemia, unspecified: Secondary | ICD-10-CM

## 2010-06-07 DIAGNOSIS — K2990 Gastroduodenitis, unspecified, without bleeding: Secondary | ICD-10-CM

## 2010-06-07 DIAGNOSIS — K5730 Diverticulosis of large intestine without perforation or abscess without bleeding: Secondary | ICD-10-CM

## 2010-06-07 DIAGNOSIS — K2970 Gastritis, unspecified, without bleeding: Secondary | ICD-10-CM

## 2010-06-07 DIAGNOSIS — D509 Iron deficiency anemia, unspecified: Secondary | ICD-10-CM

## 2010-06-07 HISTORY — PX: UPPER GASTROINTESTINAL ENDOSCOPY: SHX188

## 2010-06-07 MED ORDER — SODIUM CHLORIDE 0.9 % IV SOLN: 500.0000 mL | INTRAVENOUS | Status: DC

## 2010-06-07 NOTE — Patient Instructions (Addendum)
You need to get B12 injections/supplements through your primary care provider and I recommend a repeat injection next week. Please contact them. We will notify you about the biopsies.Information given for gastritis, hiatal hernia,diverticulosis and high fiber diet.Resume medications.

## 2010-06-08 ENCOUNTER — Telehealth: Payer: Self-pay | Admitting: *Deleted

## 2010-06-08 ENCOUNTER — Telehealth: Payer: Self-pay | Admitting: Internal Medicine

## 2010-06-08 NOTE — Telephone Encounter (Signed)

## 2010-06-08 NOTE — Telephone Encounter (Signed)
Shon Hale drew labs on him last week his h. Pylori was positive.  I have asked her to send a copy.  They will start his b12 injections for him next week. Weekly x 3 then monthly.  I have faxed her copies of the labs from the hospital.  She is advised that the patient had ECL yesterday and bx pending.  Dr Leone Payor she will leave it up to you if he needs treated for h pylori.  As far as she knows he has never been treated for it.

## 2010-06-11 ENCOUNTER — Ambulatory Visit: Payer: Self-pay | Admitting: Physician Assistant

## 2010-06-12 ENCOUNTER — Encounter: Payer: Self-pay | Admitting: *Deleted

## 2010-06-13 ENCOUNTER — Telehealth: Payer: Self-pay

## 2010-06-13 MED ORDER — BIS SUBCIT-METRONID-TETRACYC 140-125-125 MG PO CAPS: 3.0000 | ORAL_CAPSULE | Freq: Four times a day (QID) | ORAL | Status: DC

## 2010-06-13 NOTE — Telephone Encounter (Signed)
H. Pylori + on gastric bxs ad treatment recommended attached to pathology report

## 2010-06-13 NOTE — Telephone Encounter (Signed)
Message copied by Annett Fabian on Wed Jun 13, 2010  2:28 PM ------      Message from: Stan Head E      Created: Wed Jun 13, 2010  8:38 AM       Office            Call him and let him know he has H. Pylori gastritis - need to try to treat with Pylera if he can afford it and will need omeprazole changed to 20 mg bid during the 10 days on that            If cannot get the Pylera option 2 is omeprazole 20 mg bid and clarithromycin 500 mg bid and amoxicillin 1000mg  bid all x 10 days and then back to oemprazole 20 mg qd - though ? If he will need that long-term will defer to PCP as to whether or not he has GERd and needs that            Stay on iron and B12 through PCP and get follow-up labs through PCP (anemia f/u)            LEC      No EGD recall and no letter            Cc [name] at [facility]

## 2010-06-13 NOTE — Progress Notes (Signed)
Quick Note:  Office  Call him and let him know he has H. Pylori gastritis - need to try to treat with Pylera if he can afford it and will need omeprazole changed to 20 mg bid during the 10 days on that  If cannot get the Pylera option 2 is omeprazole 20 mg bid and clarithromycin 500 mg bid and amoxicillin 1000mg  bid all x 10 days and then back to oemprazole 20 mg qd - though ? If he will need that long-term will defer to PCP as to whether or not he has GERd and needs that  Stay on iron and B12 through PCP and get follow-up labs through PCP (anemia f/u)  LEC No EGD recall and no letter  Cc [name] at [facility] ______

## 2010-06-13 NOTE — Telephone Encounter (Signed)
I spoke with the patient and he is advised of the below.  He will pick up rx at the pharmacy

## 2010-06-15 ENCOUNTER — Other Ambulatory Visit: Payer: Self-pay | Admitting: Gastroenterology

## 2010-06-15 MED ORDER — OMEPRAZOLE 20 MG PO CPDR: DELAYED_RELEASE_CAPSULE | ORAL | Status: DC

## 2010-06-15 MED ORDER — CLARITHROMYCIN 500 MG PO TABS: ORAL_TABLET | ORAL | Status: DC

## 2010-06-15 MED ORDER — AMOXICILLIN 500 MG PO CAPS: ORAL_CAPSULE | ORAL | Status: DC

## 2010-06-15 NOTE — Telephone Encounter (Signed)
Received a fax from [facility] to change Pylera to something le costly. Per Dr Leone Payor, Clarithromycin 500 mg 1 bid, Amoxicillin 500 mg 2 bid, and omeprazole 20 mg 1 bid all for 10 days phoned in. After the 10 days patient is to return to Omeprazole 1 qd.

## 2010-06-18 NOTE — Telephone Encounter (Signed)
error 

## 2010-07-10 ENCOUNTER — Encounter: Payer: Self-pay | Admitting: Family Medicine

## 2010-07-10 DIAGNOSIS — J439 Emphysema, unspecified: Secondary | ICD-10-CM | POA: Insufficient documentation

## 2010-07-10 DIAGNOSIS — J45909 Unspecified asthma, uncomplicated: Secondary | ICD-10-CM | POA: Insufficient documentation

## 2010-07-10 DIAGNOSIS — K219 Gastro-esophageal reflux disease without esophagitis: Secondary | ICD-10-CM | POA: Insufficient documentation

## 2010-07-10 DIAGNOSIS — IMO0002 Reserved for concepts with insufficient information to code with codable children: Secondary | ICD-10-CM | POA: Insufficient documentation

## 2010-07-10 DIAGNOSIS — E1351 Other specified diabetes mellitus with diabetic peripheral angiopathy without gangrene: Secondary | ICD-10-CM | POA: Insufficient documentation

## 2010-07-10 DIAGNOSIS — E785 Hyperlipidemia, unspecified: Secondary | ICD-10-CM | POA: Insufficient documentation

## 2010-07-10 DIAGNOSIS — N200 Calculus of kidney: Secondary | ICD-10-CM | POA: Insufficient documentation

## 2010-07-10 DIAGNOSIS — I10 Essential (primary) hypertension: Secondary | ICD-10-CM | POA: Insufficient documentation

## 2010-07-10 DIAGNOSIS — K5790 Diverticulosis of intestine, part unspecified, without perforation or abscess without bleeding: Secondary | ICD-10-CM | POA: Insufficient documentation

## 2010-07-10 DIAGNOSIS — Z5189 Encounter for other specified aftercare: Secondary | ICD-10-CM | POA: Insufficient documentation

## 2011-02-05 DIAGNOSIS — E1139 Type 2 diabetes mellitus with other diabetic ophthalmic complication: Secondary | ICD-10-CM | POA: Diagnosis not present

## 2011-02-05 DIAGNOSIS — H52229 Regular astigmatism, unspecified eye: Secondary | ICD-10-CM | POA: Diagnosis not present

## 2011-02-05 DIAGNOSIS — H2510 Age-related nuclear cataract, unspecified eye: Secondary | ICD-10-CM | POA: Diagnosis not present

## 2011-02-05 DIAGNOSIS — H5200 Hypermetropia, unspecified eye: Secondary | ICD-10-CM | POA: Diagnosis not present

## 2011-03-01 ENCOUNTER — Encounter: Payer: Self-pay | Admitting: Cardiology

## 2011-03-01 ENCOUNTER — Ambulatory Visit (INDEPENDENT_AMBULATORY_CARE_PROVIDER_SITE_OTHER): Payer: Medicare Other | Admitting: Cardiology

## 2011-03-01 VITALS — BP 172/81 | HR 90 | Resp 18 | Ht 69.0 in | Wt 211.0 lb

## 2011-03-01 DIAGNOSIS — E785 Hyperlipidemia, unspecified: Secondary | ICD-10-CM

## 2011-03-01 DIAGNOSIS — R0989 Other specified symptoms and signs involving the circulatory and respiratory systems: Secondary | ICD-10-CM

## 2011-03-01 DIAGNOSIS — I6529 Occlusion and stenosis of unspecified carotid artery: Secondary | ICD-10-CM

## 2011-03-01 DIAGNOSIS — I2510 Atherosclerotic heart disease of native coronary artery without angina pectoris: Secondary | ICD-10-CM

## 2011-03-01 DIAGNOSIS — I10 Essential (primary) hypertension: Secondary | ICD-10-CM

## 2011-03-01 NOTE — Assessment & Plan Note (Signed)
Is currently stable but very at risk for an acute coronary syndrome and/or stroke. Getting his blood pressure down to less than 140/90, stopping smoking, and losing weight is critical. A long talk with him today and emphasized this. He is on maximum doses of 3 antihypertensives. He may need a 4 or fifth one added. He  Advised to get a large blood pressure cuff and have instructed him on how to check his pressures. He needs to lose weight and he will followup with his primary care for further changes. His blood pressure is not under control, I would most likely add carvedilol to a titrated dose and resting heart rate of 60.

## 2011-03-01 NOTE — Patient Instructions (Signed)
Your physician recommends that you schedule a follow-up appointment in: 12 months with Dr Daleen Squibb Guss Bunde next week  Your physician has requested that you have a carotid duplex. This test is an ultrasound of the carotid arteries in your neck. It looks at blood flow through these arteries that supply the brain with blood. Allow one hour for this exam. There are no restrictions or special instructions.  Low Salt diet (Information provided)  Your physician has requested that you regularly monitor and record your blood pressure readings at home. Please use the same machine at the same time of day to check your readings and record them to take to your follow-up visit with Ms. Dixon.  Ideal blood pressure is less than 140/90  Obtain large blood pressure cuff

## 2011-03-01 NOTE — Progress Notes (Signed)
HPI [name] comes in today for evaluation and management of his history of coronary artery disease.   He denies any angina or ischemic symptoms. He's had no symptoms of TIAs or mini strokes.  Unfortunately, he continues to smoke about 5 cigarettes a day. His blood pressure is elevated today. I checked it several times. He also eats lots of salt. He is a diabetic. He is overweight. He is very high risk as I pointed out to him today.  He denies orthopnea, PND, edema. He said the palpitations.  Past Medical History  Diagnosis Date  . ED (erectile dysfunction)   . Vitamin d deficiency   . Pancreatitis   . CAD (coronary artery disease)     cath 2001- one vessel occluded (circ); others patent.  EF 55%  . B12 deficiency 06/02/10    174  . Obesity   . Chronic bronchitis   . Emphysema of lung   . Blood transfusion   . Asthma   . Anemia, iron deficiency 06/02/10    heme negative, ferritin 5  . Diabetes mellitus   . GERD (gastroesophageal reflux disease)   . Hyperlipidemia   . Hypertension   . Nephrolithiasis   . Diverticulosis     Current Outpatient Prescriptions  Medication Sig Dispense Refill  . albuterol (VENTOLIN HFA) 108 (90 BASE) MCG/ACT inhaler Inhale 2 puffs into the lungs every 6 (six) hours as needed.        Marland Kitchen amLODipine (NORVASC) 10 MG tablet Take 10 mg by mouth daily.        Marland Kitchen aspirin 81 MG tablet Take 81 mg by mouth daily.        Marland Kitchen atorvastatin (LIPITOR) 80 MG tablet Take 80 mg by mouth daily.        . cyanocobalamin (,VITAMIN B-12,) 1000 MCG/ML injection Inject 1,000 mcg into the muscle every 30 (thirty) days.        . enalapril (VASOTEC) 20 MG tablet Take 20 mg by mouth 2 (two) times daily.        . folic acid (FOLVITE) 1 MG tablet Take 1 mg by mouth daily.        Marland Kitchen glipiZIDE (GLUCOTROL) 10 MG 24 hr tablet Take 10 mg by mouth daily.        . hydrOXYzine (ATARAX) 50 MG tablet Take 50 mg by mouth 3 (three) times daily as needed. itching       . iron polysaccharides  (NU-IRON) 150 MG capsule Take 150 mg by mouth 2 (two) times daily.        . metFORMIN (GLUCOPHAGE) 1000 MG tablet Take 1,000 mg by mouth 2 (two) times daily with a meal.        . omeprazole (PRILOSEC) 20 MG capsule Take 1 tablet by mouth twice a day x 10 days, then return to 1 daily  20 capsule  0  . pioglitazone (ACTOS) 45 MG tablet Take 45 mg by mouth daily.        Marland Kitchen tiotropium (SPIRIVA) 18 MCG inhalation capsule Place 18 mcg into inhaler and inhale daily.        . valsartan (DIOVAN) 320 MG tablet Take 320 mg by mouth daily.         Current Facility-Administered Medications  Medication Dose Route Frequency Provider Last Rate Last Dose  . 0.9 %  sodium chloride infusion  500 mL Intravenous Continuous Iva Boop, MD        No Known Allergies  Family History  Problem  Relation Age of Onset  . Coronary artery disease Mother   . COPD Father     History   Social History  . Marital Status: Single    Spouse Name: N/A    Number of Children: N/A  . Years of Education: N/A   Occupational History  . Not on file.   Social History Main Topics  . Smoking status: Current Some Day Smoker    Types: Cigarettes  . Smokeless tobacco: Never Used  . Alcohol Use: No  . Drug Use: No  . Sexually Active: Not on file   Other Topics Concern  . Not on file   Social History Narrative  . No narrative on file    ROS ALL NEGATIVE EXCEPT THOSE NOTED IN HPI  PE  General Appearance: well developed, well nourished in no acute distress, obese HEENT: symmetrical face, PERRLA, good dentition  Neck: no JVD, thyromegaly, or adenopathy, trachea midline Chest: symmetric without deformity Cardiac: PMI non-displaced, RRR, normal S1, S2, no gallop or murmur Lung: clear to ausculation and percussion Vascular: all pulses full, Left carotid bruit Abdominal: nondistended, nontender, good bowel sounds, no HSM, no bruits Extremities: no cyanosis, clubbing or edema, no sign of DVT, no varicosities  Skin:  normal color, no rashes Neuro: alert and oriented x 3, non-focal Pysch: normal affect  EKG Not repeated today. BMET    Component Value Date/Time   NA 139 06/03/2010 0430   K 3.5 06/03/2010 0430   CL 103 06/03/2010 0430   CO2 25 06/03/2010 0430   GLUCOSE 83 06/03/2010 0430   BUN 15 06/03/2010 0430   CREATININE 1.03 06/03/2010 0430   CALCIUM 8.8 06/03/2010 0430   GFRNONAA >60 06/03/2010 0430   GFRAA  Value: >60        The eGFR has been calculated using the MDRD equation. This calculation has not been validated in all clinical situations. eGFR's persistently <60 mL/min signify possible Chronic Kidney Disease. 06/03/2010 0430    Lipid Panel     Component Value Date/Time   CHOL 119 06/03/2010 0430   TRIG 79 06/03/2010 0430   HDL 41 06/03/2010 0430   CHOLHDL 2.9 06/03/2010 0430   VLDL 16 06/03/2010 0430   LDLCALC  Value: 62        Total Cholesterol/HDL:CHD Risk Coronary Heart Disease Risk Table                     Men   Women  1/2 Average Risk   3.4   3.3  Average Risk       5.0   4.4  2 X Average Risk   9.6   7.1  3 X Average Risk  23.4   11.0        Use the calculated Patient Ratio above and the CHD Risk Table to determine the patient's CHD Risk.        ATP III CLASSIFICATION (LDL):  <100     mg/dL   Optimal  960-454  mg/dL   Near or Above                    Optimal  130-159  mg/dL   Borderline  098-119  mg/dL   High  >147     mg/dL   Very High 09/05/5619 3086    CBC    Component Value Date/Time   WBC 9.2 06/03/2010 1410   RBC 4.71 06/03/2010 1410   HGB 10.5* 06/03/2010 1410   HCT 34.3*  06/03/2010 1410   PLT 472* 06/03/2010 1410   MCV 72.8* 06/03/2010 1410   MCH 22.3* 06/03/2010 1410   MCHC 30.6 06/03/2010 1410   RDW 18.4* 06/03/2010 1410   LYMPHSABS 1.6 06/02/2010 0900   MONOABS 0.5 06/02/2010 0900   EOSABS 0.3 06/02/2010 0900   BASOSABS 0.1 06/02/2010 0900

## 2011-03-01 NOTE — Assessment & Plan Note (Signed)
This is a new finding for me. Will obtain carotid Dopplers to rule out obstructive disease. Continue aggressive secondary preventive care.

## 2011-03-04 DIAGNOSIS — I10 Essential (primary) hypertension: Secondary | ICD-10-CM | POA: Diagnosis not present

## 2011-03-04 DIAGNOSIS — Z125 Encounter for screening for malignant neoplasm of prostate: Secondary | ICD-10-CM | POA: Diagnosis not present

## 2011-03-04 DIAGNOSIS — E538 Deficiency of other specified B group vitamins: Secondary | ICD-10-CM | POA: Diagnosis not present

## 2011-03-04 DIAGNOSIS — D509 Iron deficiency anemia, unspecified: Secondary | ICD-10-CM | POA: Diagnosis not present

## 2011-03-04 DIAGNOSIS — E119 Type 2 diabetes mellitus without complications: Secondary | ICD-10-CM | POA: Diagnosis not present

## 2011-03-04 DIAGNOSIS — E559 Vitamin D deficiency, unspecified: Secondary | ICD-10-CM | POA: Diagnosis not present

## 2011-03-04 DIAGNOSIS — D649 Anemia, unspecified: Secondary | ICD-10-CM | POA: Diagnosis not present

## 2011-03-05 ENCOUNTER — Ambulatory Visit (HOSPITAL_COMMUNITY)
Admission: RE | Admit: 2011-03-05 | Discharge: 2011-03-05 | Disposition: A | Discharge status: Home / Self Care | Payer: Medicare Other | Source: Ambulatory Visit | Attending: Cardiology | Admitting: Cardiology

## 2011-03-05 DIAGNOSIS — I10 Essential (primary) hypertension: Secondary | ICD-10-CM | POA: Insufficient documentation

## 2011-03-05 DIAGNOSIS — I6529 Occlusion and stenosis of unspecified carotid artery: Secondary | ICD-10-CM | POA: Diagnosis not present

## 2011-03-05 DIAGNOSIS — E119 Type 2 diabetes mellitus without complications: Secondary | ICD-10-CM | POA: Insufficient documentation

## 2011-05-06 DIAGNOSIS — E538 Deficiency of other specified B group vitamins: Secondary | ICD-10-CM | POA: Diagnosis not present

## 2011-06-05 DIAGNOSIS — E785 Hyperlipidemia, unspecified: Secondary | ICD-10-CM | POA: Diagnosis not present

## 2011-06-05 DIAGNOSIS — E559 Vitamin D deficiency, unspecified: Secondary | ICD-10-CM | POA: Diagnosis not present

## 2011-06-05 DIAGNOSIS — D518 Other vitamin B12 deficiency anemias: Secondary | ICD-10-CM | POA: Diagnosis not present

## 2011-06-05 DIAGNOSIS — E119 Type 2 diabetes mellitus without complications: Secondary | ICD-10-CM | POA: Diagnosis not present

## 2011-06-05 DIAGNOSIS — I10 Essential (primary) hypertension: Secondary | ICD-10-CM | POA: Diagnosis not present

## 2011-06-05 DIAGNOSIS — D509 Iron deficiency anemia, unspecified: Secondary | ICD-10-CM | POA: Diagnosis not present

## 2011-07-02 DIAGNOSIS — M48061 Spinal stenosis, lumbar region without neurogenic claudication: Secondary | ICD-10-CM | POA: Diagnosis not present

## 2011-07-02 DIAGNOSIS — M25569 Pain in unspecified knee: Secondary | ICD-10-CM | POA: Diagnosis not present

## 2011-07-16 DIAGNOSIS — M48061 Spinal stenosis, lumbar region without neurogenic claudication: Secondary | ICD-10-CM | POA: Diagnosis not present

## 2011-08-02 DIAGNOSIS — E538 Deficiency of other specified B group vitamins: Secondary | ICD-10-CM | POA: Diagnosis not present

## 2011-09-06 DIAGNOSIS — E119 Type 2 diabetes mellitus without complications: Secondary | ICD-10-CM | POA: Diagnosis not present

## 2011-09-06 DIAGNOSIS — E559 Vitamin D deficiency, unspecified: Secondary | ICD-10-CM | POA: Diagnosis not present

## 2011-09-06 DIAGNOSIS — IMO0001 Reserved for inherently not codable concepts without codable children: Secondary | ICD-10-CM | POA: Diagnosis not present

## 2011-09-06 DIAGNOSIS — I10 Essential (primary) hypertension: Secondary | ICD-10-CM | POA: Diagnosis not present

## 2011-09-06 DIAGNOSIS — D518 Other vitamin B12 deficiency anemias: Secondary | ICD-10-CM | POA: Diagnosis not present

## 2011-09-06 DIAGNOSIS — E538 Deficiency of other specified B group vitamins: Secondary | ICD-10-CM | POA: Diagnosis not present

## 2011-10-07 DIAGNOSIS — E538 Deficiency of other specified B group vitamins: Secondary | ICD-10-CM | POA: Diagnosis not present

## 2011-11-07 DIAGNOSIS — E538 Deficiency of other specified B group vitamins: Secondary | ICD-10-CM | POA: Diagnosis not present

## 2011-12-09 DIAGNOSIS — E785 Hyperlipidemia, unspecified: Secondary | ICD-10-CM | POA: Diagnosis not present

## 2011-12-09 DIAGNOSIS — E119 Type 2 diabetes mellitus without complications: Secondary | ICD-10-CM | POA: Diagnosis not present

## 2011-12-09 DIAGNOSIS — I10 Essential (primary) hypertension: Secondary | ICD-10-CM | POA: Diagnosis not present

## 2011-12-09 DIAGNOSIS — Z23 Encounter for immunization: Secondary | ICD-10-CM | POA: Diagnosis not present

## 2011-12-09 DIAGNOSIS — E538 Deficiency of other specified B group vitamins: Secondary | ICD-10-CM | POA: Diagnosis not present

## 2012-01-09 DIAGNOSIS — E538 Deficiency of other specified B group vitamins: Secondary | ICD-10-CM | POA: Diagnosis not present

## 2012-02-10 DIAGNOSIS — E538 Deficiency of other specified B group vitamins: Secondary | ICD-10-CM | POA: Diagnosis not present

## 2012-03-09 DIAGNOSIS — E119 Type 2 diabetes mellitus without complications: Secondary | ICD-10-CM | POA: Diagnosis not present

## 2012-03-09 DIAGNOSIS — I10 Essential (primary) hypertension: Secondary | ICD-10-CM | POA: Diagnosis not present

## 2012-04-20 ENCOUNTER — Ambulatory Visit (INDEPENDENT_AMBULATORY_CARE_PROVIDER_SITE_OTHER): Payer: Medicare Other | Admitting: Family Medicine

## 2012-04-20 DIAGNOSIS — E538 Deficiency of other specified B group vitamins: Secondary | ICD-10-CM | POA: Diagnosis not present

## 2012-04-20 MED ORDER — CYANOCOBALAMIN 1000 MCG/ML IJ SOLN
1000.0000 ug | Freq: Once | INTRAMUSCULAR | Status: AC
  Administered 2012-04-20: 1000 ug via INTRAMUSCULAR

## 2012-05-19 ENCOUNTER — Encounter: Payer: Self-pay | Admitting: Cardiology

## 2012-05-19 ENCOUNTER — Ambulatory Visit (INDEPENDENT_AMBULATORY_CARE_PROVIDER_SITE_OTHER): Payer: Medicare Other | Admitting: Cardiology

## 2012-05-19 VITALS — BP 140/70 | HR 73 | Ht 69.0 in | Wt 209.1 lb

## 2012-05-19 DIAGNOSIS — R0989 Other specified symptoms and signs involving the circulatory and respiratory systems: Secondary | ICD-10-CM | POA: Diagnosis not present

## 2012-05-19 DIAGNOSIS — I2510 Atherosclerotic heart disease of native coronary artery without angina pectoris: Secondary | ICD-10-CM

## 2012-05-19 DIAGNOSIS — E1351 Other specified diabetes mellitus with diabetic peripheral angiopathy without gangrene: Secondary | ICD-10-CM

## 2012-05-19 DIAGNOSIS — I10 Essential (primary) hypertension: Secondary | ICD-10-CM

## 2012-05-19 DIAGNOSIS — E785 Hyperlipidemia, unspecified: Secondary | ICD-10-CM

## 2012-05-19 DIAGNOSIS — I798 Other disorders of arteries, arterioles and capillaries in diseases classified elsewhere: Secondary | ICD-10-CM

## 2012-05-19 DIAGNOSIS — E1365 Other specified diabetes mellitus with hyperglycemia: Secondary | ICD-10-CM

## 2012-05-19 NOTE — Assessment & Plan Note (Signed)
Stable. Continue secondary preventative therapy. Return the office in one year. 

## 2012-05-19 NOTE — Progress Notes (Signed)
HPI Mr Beza returns today for evaluation and management history of coronary artery disease. He has totally occluded circumflex by cath in 2001. He is asymptomatic with no angina. He is almost quit smoking. He also has a right carotid bruit. 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 Followup in 2 years.  He is compliant with his meds.  Past Medical History  Diagnosis Date  . ED (erectile dysfunction)   . Vitamin D deficiency   . Pancreatitis   . CAD (coronary artery disease)     cath 2001- one vessel occluded (circ); others patent.  EF 55%  . B12 deficiency 06/02/10    174  . Obesity   . Chronic bronchitis   . Emphysema of lung   . Blood transfusion   . Asthma   . Anemia, iron deficiency 06/02/10    heme negative, ferritin 5  . Diabetes mellitus   . GERD (gastroesophageal reflux disease)   . Hyperlipidemia   . Hypertension   . Nephrolithiasis   . Diverticulosis     Current Outpatient Prescriptions  Medication Sig Dispense Refill  . albuterol (VENTOLIN HFA) 108 (90 BASE) MCG/ACT inhaler Inhale 2 puffs into the lungs every 6 (six) hours as needed.        Marland Kitchen amLODipine (NORVASC) 10 MG tablet Take 10 mg by mouth daily.        Marland Kitchen aspirin 81 MG tablet Take 81 mg by mouth daily.        Marland Kitchen atorvastatin (LIPITOR) 80 MG tablet Take 80 mg by mouth daily.        . cyanocobalamin (,VITAMIN B-12,) 1000 MCG/ML injection Inject 1,000 mcg into the muscle every 30 (thirty) days.        . enalapril (VASOTEC) 20 MG tablet Take 20 mg by mouth 2 (two) times daily.        . folic acid (FOLVITE) 1 MG tablet Take 1 mg by mouth daily.        Marland Kitchen glipiZIDE (GLUCOTROL) 10 MG 24 hr tablet Take 10 mg by mouth daily.        . hydrOXYzine (ATARAX) 50 MG tablet Take 50 mg by mouth 3 (three) times daily as needed. itching       . iron polysaccharides (NU-IRON) 150 MG capsule Take 150 mg by mouth 2 (two) times daily.        . metFORMIN (GLUCOPHAGE) 1000 MG tablet Take 1,000 mg by mouth 2 (two) times  daily with a meal.        . omeprazole (PRILOSEC) 20 MG capsule Take 1 tablet by mouth twice a day x 10 days, then return to 1 daily  20 capsule  0  . pioglitazone (ACTOS) 45 MG tablet Take 45 mg by mouth daily.        Marland Kitchen tiotropium (SPIRIVA) 18 MCG inhalation capsule Place 18 mcg into inhaler and inhale daily.        . valsartan (DIOVAN) 320 MG tablet Take 320 mg by mouth daily.         Current Facility-Administered Medications  Medication Dose Route Frequency Provider Last Rate Last Dose  . 0.9 %  sodium chloride infusion  500 mL Intravenous Continuous Iva Boop, MD        No Known Allergies  Family History  Problem Relation Age of Onset  . Coronary artery disease Mother   . COPD Father     History   Social History  . Marital Status: Single    Spouse  Name: N/A    Number of Children: N/A  . Years of Education: N/A   Occupational History  . Not on file.   Social History Main Topics  . Smoking status: Current Some Day Smoker    Types: Cigarettes  . Smokeless tobacco: Never Used  . Alcohol Use: No  . Drug Use: No  . Sexually Active: Not on file   Other Topics Concern  . Not on file   Social History Narrative  . No narrative on file    ROS ALL NEGATIVE EXCEPT THOSE NOTED IN HPI  PE  General Appearance: well developed, well nourished in no acute distress, obese HEENT: symmetrical face, PERRLA, good dentition  Neck: no JVD, thyromegaly, or adenopathy, trachea midline Chest: symmetric without deformity Cardiac: PMI non-displaced, RRR, normal S1, S2, no gallop or murmur Lung: clear to ausculation and percussion Vascular: all pulses full, right carotid bruit Abdominal: nondistended, nontender, good bowel sounds, no HSM, no bruits Extremities: no cyanosis, clubbing or edema, no sign of DVT, no varicosities  Skin: normal color, no rashes Neuro: alert and oriented x 3, non-focal Pysch: normal affect  EKG Normal sinus rhythm, nonspecific ST segment  changes. BMET    Component Value Date/Time   NA 139 06/03/2010 0430   K 3.5 06/03/2010 0430   CL 103 06/03/2010 0430   CO2 25 06/03/2010 0430   GLUCOSE 83 06/03/2010 0430   BUN 15 06/03/2010 0430   CREATININE 1.03 06/03/2010 0430   CALCIUM 8.8 06/03/2010 0430   GFRNONAA >60 06/03/2010 0430   GFRAA  Value: >60        The eGFR has been calculated using the MDRD equation. This calculation has not been validated in all clinical situations. eGFR's persistently <60 mL/min signify possible Chronic Kidney Disease. 06/03/2010 0430    Lipid Panel     Component Value Date/Time   CHOL 119 06/03/2010 0430   TRIG 79 06/03/2010 0430   HDL 41 06/03/2010 0430   CHOLHDL 2.9 06/03/2010 0430   VLDL 16 06/03/2010 0430   LDLCALC  Value: 62        Total Cholesterol/HDL:CHD Risk Coronary Heart Disease Risk Table                     Men   Women  1/2 Average Risk   3.4   3.3  Average Risk       5.0   4.4  2 X Average Risk   9.6   7.1  3 X Average Risk  23.4   11.0        Use the calculated Patient Ratio above and the CHD Risk Table to determine the patient's CHD Risk.        ATP III CLASSIFICATION (LDL):  <100     mg/dL   Optimal  161-096  mg/dL   Near or Above                    Optimal  130-159  mg/dL   Borderline  045-409  mg/dL   High  >811     mg/dL   Very High 09/21/7827 5621    CBC    Component Value Date/Time   WBC 9.2 06/03/2010 1410   RBC 4.71 06/03/2010 1410   HGB 10.5* 06/03/2010 1410   HCT 34.3* 06/03/2010 1410   PLT 472* 06/03/2010 1410   MCV 72.8* 06/03/2010 1410   MCH 22.3* 06/03/2010 1410   MCHC 30.6 06/03/2010 1410   RDW  18.4* 06/03/2010 1410   LYMPHSABS 1.6 06/02/2010 0900   MONOABS 0.5 06/02/2010 0900   EOSABS 0.3 06/02/2010 0900   BASOSABS 0.1 06/02/2010 0900

## 2012-05-19 NOTE — Assessment & Plan Note (Signed)
Continue secondary preventative therapy. Repeat carotid Dopplers next year.

## 2012-05-19 NOTE — Patient Instructions (Addendum)
Your physician wants you to follow-up in: 1 year with Dr. Branch. You will receive a reminder letter in the mail two months in advance. If you don't receive a letter, please call our office to schedule the follow-up appointment.  

## 2012-05-20 ENCOUNTER — Other Ambulatory Visit: Payer: Self-pay | Admitting: Family Medicine

## 2012-05-20 ENCOUNTER — Ambulatory Visit (INDEPENDENT_AMBULATORY_CARE_PROVIDER_SITE_OTHER): Payer: Medicare Other | Admitting: Family Medicine

## 2012-05-20 DIAGNOSIS — E538 Deficiency of other specified B group vitamins: Secondary | ICD-10-CM

## 2012-05-20 MED ORDER — GLIPIZIDE ER 10 MG PO TB24: 10.0000 mg | ORAL_TABLET | Freq: Every day | ORAL | Status: DC

## 2012-05-20 MED ORDER — METFORMIN HCL 1000 MG PO TABS: 1000.0000 mg | ORAL_TABLET | Freq: Two times a day (BID) | ORAL | Status: DC

## 2012-05-20 MED ORDER — AMLODIPINE BESYLATE 10 MG PO TABS: 10.0000 mg | ORAL_TABLET | Freq: Every day | ORAL | Status: DC

## 2012-05-20 MED ORDER — PIOGLITAZONE HCL 45 MG PO TABS: 45.0000 mg | ORAL_TABLET | Freq: Every day | ORAL | Status: DC

## 2012-05-20 MED ORDER — ATORVASTATIN CALCIUM 80 MG PO TABS: 80.0000 mg | ORAL_TABLET | Freq: Every day | ORAL | Status: DC

## 2012-05-20 MED ORDER — ENALAPRIL MALEATE 20 MG PO TABS: 20.0000 mg | ORAL_TABLET | Freq: Two times a day (BID) | ORAL | Status: DC

## 2012-05-20 MED ORDER — VALSARTAN 320 MG PO TABS: 320.0000 mg | ORAL_TABLET | Freq: Every day | ORAL | Status: DC

## 2012-05-20 MED ORDER — CYANOCOBALAMIN 1000 MCG/ML IJ SOLN
1000.0000 ug | Freq: Once | INTRAMUSCULAR | Status: AC
  Administered 2012-05-20: 1000 ug via INTRAMUSCULAR

## 2012-05-20 NOTE — Telephone Encounter (Signed)
[employer] rx form completed for pt to get refills and new rx written for others.  Forms mailed to pt at his request

## 2012-06-08 ENCOUNTER — Encounter: Payer: Self-pay | Admitting: Physician Assistant

## 2012-06-08 ENCOUNTER — Ambulatory Visit (INDEPENDENT_AMBULATORY_CARE_PROVIDER_SITE_OTHER): Payer: Medicare Other | Admitting: Physician Assistant

## 2012-06-08 VITALS — BP 120/66 | HR 68 | Temp 97.0°F | Resp 20 | Ht 68.5 in | Wt 208.0 lb

## 2012-06-08 DIAGNOSIS — J439 Emphysema, unspecified: Secondary | ICD-10-CM

## 2012-06-08 DIAGNOSIS — I2510 Atherosclerotic heart disease of native coronary artery without angina pectoris: Secondary | ICD-10-CM | POA: Diagnosis not present

## 2012-06-08 DIAGNOSIS — E538 Deficiency of other specified B group vitamins: Secondary | ICD-10-CM | POA: Diagnosis not present

## 2012-06-08 DIAGNOSIS — E559 Vitamin D deficiency, unspecified: Secondary | ICD-10-CM

## 2012-06-08 DIAGNOSIS — Z125 Encounter for screening for malignant neoplasm of prostate: Secondary | ICD-10-CM | POA: Diagnosis not present

## 2012-06-08 DIAGNOSIS — J438 Other emphysema: Secondary | ICD-10-CM

## 2012-06-08 DIAGNOSIS — I10 Essential (primary) hypertension: Secondary | ICD-10-CM | POA: Diagnosis not present

## 2012-06-08 DIAGNOSIS — K219 Gastro-esophageal reflux disease without esophagitis: Secondary | ICD-10-CM

## 2012-06-08 DIAGNOSIS — K2970 Gastritis, unspecified, without bleeding: Secondary | ICD-10-CM

## 2012-06-08 DIAGNOSIS — R0989 Other specified symptoms and signs involving the circulatory and respiratory systems: Secondary | ICD-10-CM

## 2012-06-08 DIAGNOSIS — E1351 Other specified diabetes mellitus with diabetic peripheral angiopathy without gangrene: Secondary | ICD-10-CM | POA: Diagnosis not present

## 2012-06-08 DIAGNOSIS — D509 Iron deficiency anemia, unspecified: Secondary | ICD-10-CM | POA: Diagnosis not present

## 2012-06-08 DIAGNOSIS — I798 Other disorders of arteries, arterioles and capillaries in diseases classified elsewhere: Secondary | ICD-10-CM

## 2012-06-08 DIAGNOSIS — E785 Hyperlipidemia, unspecified: Secondary | ICD-10-CM | POA: Diagnosis not present

## 2012-06-08 DIAGNOSIS — K449 Diaphragmatic hernia without obstruction or gangrene: Secondary | ICD-10-CM

## 2012-06-08 DIAGNOSIS — A048 Other specified bacterial intestinal infections: Secondary | ICD-10-CM | POA: Insufficient documentation

## 2012-06-08 DIAGNOSIS — K5790 Diverticulosis of intestine, part unspecified, without perforation or abscess without bleeding: Secondary | ICD-10-CM

## 2012-06-08 DIAGNOSIS — K5730 Diverticulosis of large intestine without perforation or abscess without bleeding: Secondary | ICD-10-CM

## 2012-06-08 LAB — COMPLETE METABOLIC PANEL WITH GFR
Alkaline Phosphatase: 98 U/L (ref 39–117)
BUN: 14 mg/dL (ref 6–23)
Creat: 1.01 mg/dL (ref 0.50–1.35)
GFR, Est Non African American: 75 mL/min
Glucose, Bld: 121 mg/dL — ABNORMAL HIGH (ref 70–99)
Sodium: 141 mEq/L (ref 135–145)
Total Bilirubin: 0.3 mg/dL (ref 0.3–1.2)
Total Protein: 6.5 g/dL (ref 6.0–8.3)

## 2012-06-08 LAB — VITAMIN B12: Vitamin B-12: 458 pg/mL (ref 211–911)

## 2012-06-08 LAB — LIPID PANEL
Cholesterol: 103 mg/dL (ref 0–200)
Total CHOL/HDL Ratio: 2.5 Ratio

## 2012-06-08 LAB — HEMOGLOBIN A1C: Hgb A1c MFr Bld: 6.9 % — ABNORMAL HIGH (ref <5.7)

## 2012-06-08 NOTE — Progress Notes (Signed)
Patient ID: Jonathan [name] MRN: [id number], DOB: 12-16-1940, 72 y.o. Date of Encounter: @DATE @  Chief Complaint:  Chief Complaint  Patient presents with  . 3 mth check up    HPI: 72 y.o. year old male  presents for routine f/u OV.  He reports he was out of all his meds for 10 days b/c mail order was "slow" coming in-restarted all meds 3 days ago. O/W has been feeling good. No complaints.    1. DM: Other than above, taking meds as directed. Still checks BS 2-3 times per week-a.m. And h.s. -AM gets around 130. Readings have been pretty good.  2. HTN: Other htan above, taking meds as directed.No adv effects. No LE Edema 3. HLD: other than above, taking meds as directed. No myalgias. No other adverse effects. 4. Only smokes occasionally. Most days, doses not smoke at all.   Even with exertion, has no chest pressure, heaviness, tightness, or increased SOB/DOE.   Past Medical History  Diagnosis Date  . ED (erectile dysfunction)   . Vitamin D deficiency   . Pancreatitis   . CAD (coronary artery disease)     cath 2001- one vessel occluded (circ); others patent.  EF 55%  . B12 deficiency 06/02/10    174  . Obesity   . Chronic bronchitis   . Emphysema of lung   . Blood transfusion   . Asthma   . Anemia, iron deficiency 06/02/10    heme negative, ferritin 5  . Diabetes mellitus   . GERD (gastroesophageal reflux disease)   . Hyperlipidemia   . Hypertension   . Nephrolithiasis   . Diverticulosis   . H. pylori infection 05/08/2010  . Hiatal hernia 05/08/2010    EGD  . Gastritis 05/08/2010    EGD     Home Meds: See attached medication section for current medication list. Any medications entered into computer today will not appear on this note's list. The medications listed below were entered prior to today. Current Outpatient Prescriptions on File Prior to Visit  Medication Sig Dispense Refill  . albuterol (VENTOLIN HFA) 108 (90 BASE) MCG/ACT inhaler Inhale 2 puffs into the lungs  every 6 (six) hours as needed.        Marland Kitchen amLODipine (NORVASC) 10 MG tablet Take 1 tablet (10 mg total) by mouth daily.  90 tablet  3  . aspirin 81 MG tablet Take 81 mg by mouth daily.        Marland Kitchen atorvastatin (LIPITOR) 80 MG tablet Take 1 tablet (80 mg total) by mouth daily.  90 tablet  3  . cyanocobalamin (,VITAMIN B-12,) 1000 MCG/ML injection Inject 1,000 mcg into the muscle every 30 (thirty) days.        . enalapril (VASOTEC) 20 MG tablet Take 1 tablet (20 mg total) by mouth 2 (two) times daily.  180 tablet  3  . folic acid (FOLVITE) 1 MG tablet Take 1 mg by mouth daily.        Marland Kitchen glipiZIDE (GLUCOTROL XL) 10 MG 24 hr tablet Take 1 tablet (10 mg total) by mouth daily.  90 tablet  3  . hydrOXYzine (ATARAX) 50 MG tablet Take 50 mg by mouth 3 (three) times daily as needed. itching       . iron polysaccharides (NU-IRON) 150 MG capsule Take 150 mg by mouth 2 (two) times daily.        . metFORMIN (GLUCOPHAGE) 1000 MG tablet Take 1 tablet (1,000 mg total) by mouth 2 (two)  times daily with a meal.  180 tablet  3  . omeprazole (PRILOSEC) 20 MG capsule Take 1 tablet by mouth twice a day x 10 days, then return to 1 daily  20 capsule  0  . pioglitazone (ACTOS) 45 MG tablet Take 1 tablet (45 mg total) by mouth daily.  90 tablet  3  . tiotropium (SPIRIVA) 18 MCG inhalation capsule Place 18 mcg into inhaler and inhale daily.        . valsartan (DIOVAN) 320 MG tablet Take 1 tablet (320 mg total) by mouth daily.  90 tablet  3   No current facility-administered medications on file prior to visit.    Allergies: No Known Allergies  History   Social History  . Marital Status: Single    Spouse Name: N/A    Number of Children: N/A  . Years of Education: N/A   Occupational History  . Not on file.   Social History Main Topics  . Smoking status: Current Some Day Smoker    Types: Cigarettes  . Smokeless tobacco: Never Used  . Alcohol Use: No  . Drug Use: No  . Sexually Active: Not on file   Other Topics  Concern  . Not on file   Social History Narrative  . No narrative on file    Family History  Problem Relation Age of Onset  . Coronary artery disease Mother      Review of Systems:  See HPI for pertinent ROS. All other ROS negative.    Physical Exam: Blood pressure 120/66, pulse 68, temperature 97 F (36.1 C), temperature source Oral, resp. rate 20, height 5' 8.5" (1.74 m), weight 208 lb (94.348 kg)., Body mass index is 31.16 kg/(m^2). General: WNWD WM. Appears in no acute distress. Head: Normocephalic, atraumatic, eyes without discharge, sclera non-icteric, nares are without discharge. Bilateral auditory canals clear, TM's are without perforation, pearly grey and translucent with reflective cone of light bilaterally. Oral cavity moist, posterior pharynx without exudate, erythema, peritonsillar abscess, or post nasal drip.  Neck: Supple. No thyromegaly. No lymphadenopathy.Left Carotid Artery with soft bruit. No bruit on the Right.  Lungs: Clear bilaterally to auscultation without wheezes, rales, or rhonchi. Breathing is unlabored. Heart: RRR with S1 S2. No murmurs, rubs, or gallops. Abdomen: Soft, non-tender, non-distended with normoactive bowel sounds. No hepatomegaly. No rebound/guarding. No obvious abdominal masses. Musculoskeletal:  Strength and tone normal for age. Extremities/Skin: Warm and dry. No clubbing or cyanosis. No edema. No rashes or suspicious lesions. Neuro: Alert and oriented X 3. Moves all extremities spontaneously. Gait is normal. CNII-XII grossly in tact. Psych:  Responds to questions appropriately with a normal affect.     ASSESSMENT AND PLAN:  72 y.o. year old male with  1. DM (diabetes mellitus), secondary, uncontrolled, with peripheral vascular complications - Hemoglobin A1c MicroAlbumin: Had 12/10/2011.  2. Hyperlipidemia On Lipitor 80mg . Recheck labs. - COMPLETE METABOLIC PANEL WITH GFR - Lipid panel  3. Hypertension BP at goal. Cont current meds.   - COMPLETE METABOLIC PANEL WITH GFR  4. Anemia, iron deficiency See note 12/03/2010 for complete details. Labs rechecked 05/2011--h/h back to normal.  On Iron and B12 Injections.   5. B12 deficiency Last injection was 05/20/2012. Recheck level now.  - Vitamin B12  6. GERD (gastroesophageal reflux disease) Controlled with prilosec.  7. Gastritis On EGD 05/2010  8. Hiatal hernia On EGD 05/2010  9. Emphysema of lung  10. CAD, NATIVE VESSEL  11. 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  12. Diverticulosis  13. Prostate cancer screening: Last check 02/2011 - PSA, Medicare  14. Vitamin D deficiency - Vitamin D 25 hydroxy  15. Colonoscopy: 2012: Repeat 10 years.  16. Vaccines: Tetanus: Discussed 12/09/11-Pt deferred Pneumovax: Had in 2003 Zostavax: Had 03/09/2012  ROV 3 months, sooner if needed.  [address] [place], [name], [facility] 06/08/2012 4:31 PM

## 2012-06-09 LAB — VITAMIN D 25 HYDROXY (VIT D DEFICIENCY, FRACTURES): Vit D, 25-Hydroxy: 45 ng/mL (ref 30–89)

## 2012-06-10 ENCOUNTER — Telehealth: Payer: Self-pay | Admitting: Family Medicine

## 2012-06-10 MED ORDER — ATORVASTATIN CALCIUM 40 MG PO TABS: 40.0000 mg | ORAL_TABLET | Freq: Every day | ORAL | Status: DC

## 2012-06-10 NOTE — Telephone Encounter (Signed)
Pt called with lab results.  Told about decreasing Lipitor to 40 mg a day.  He says he had plenty of 80 mg pills to cut in half.  Does not need any called in right now.  Med dose change made on med list

## 2012-06-10 NOTE — Telephone Encounter (Signed)
Message copied by Donne Anon on Wed Jun 10, 2012 11:16 AM ------      Message from: [name]      Created: Tue Jun 09, 2012  9:55 AM       Cholesterol has gotten VERY low--LDL down to 45. Currently on Lipitor 80mg . He can decrease this to 40mg . Can cut current pills in half.      Send in new RX for Lipitor 40 also(mailorder)      A1C is 6.9--cont current meds. Careful with diet. Rest of labs are all good-cont all other meds the same. ------

## 2012-06-22 ENCOUNTER — Ambulatory Visit (INDEPENDENT_AMBULATORY_CARE_PROVIDER_SITE_OTHER): Payer: Medicare Other | Admitting: Family Medicine

## 2012-06-22 DIAGNOSIS — D518 Other vitamin B12 deficiency anemias: Secondary | ICD-10-CM

## 2012-06-22 DIAGNOSIS — D519 Vitamin B12 deficiency anemia, unspecified: Secondary | ICD-10-CM

## 2012-06-22 MED ORDER — CYANOCOBALAMIN 1000 MCG/ML IJ SOLN
1000.0000 ug | Freq: Once | INTRAMUSCULAR | Status: AC
  Administered 2012-06-22: 1000 ug via INTRAMUSCULAR

## 2012-07-27 ENCOUNTER — Ambulatory Visit (INDEPENDENT_AMBULATORY_CARE_PROVIDER_SITE_OTHER): Payer: Medicare Other | Admitting: Family Medicine

## 2012-07-27 DIAGNOSIS — D518 Other vitamin B12 deficiency anemias: Secondary | ICD-10-CM | POA: Diagnosis not present

## 2012-07-27 DIAGNOSIS — D519 Vitamin B12 deficiency anemia, unspecified: Secondary | ICD-10-CM

## 2012-07-27 MED ORDER — CYANOCOBALAMIN 1000 MCG/ML IJ SOLN
1000.0000 ug | Freq: Once | INTRAMUSCULAR | Status: AC
  Administered 2012-07-27: 1000 ug via INTRAMUSCULAR

## 2012-08-25 ENCOUNTER — Ambulatory Visit (INDEPENDENT_AMBULATORY_CARE_PROVIDER_SITE_OTHER): Payer: Medicare Other | Admitting: Family Medicine

## 2012-08-25 DIAGNOSIS — E559 Vitamin D deficiency, unspecified: Secondary | ICD-10-CM

## 2012-08-25 MED ORDER — CYANOCOBALAMIN 1000 MCG/ML IJ SOLN
1000.0000 ug | Freq: Once | INTRAMUSCULAR | Status: AC
  Administered 2012-08-25: 1000 ug via INTRAMUSCULAR

## 2012-09-09 ENCOUNTER — Ambulatory Visit (INDEPENDENT_AMBULATORY_CARE_PROVIDER_SITE_OTHER): Payer: Medicare Other | Admitting: Physician Assistant

## 2012-09-09 ENCOUNTER — Other Ambulatory Visit: Payer: Self-pay | Admitting: Family Medicine

## 2012-09-09 ENCOUNTER — Telehealth: Payer: Self-pay | Admitting: Physician Assistant

## 2012-09-09 ENCOUNTER — Encounter: Payer: Self-pay | Admitting: Physician Assistant

## 2012-09-09 VITALS — BP 136/74 | HR 84 | Temp 97.5°F | Resp 18 | Wt 201.0 lb

## 2012-09-09 DIAGNOSIS — E1351 Other specified diabetes mellitus with diabetic peripheral angiopathy without gangrene: Secondary | ICD-10-CM | POA: Diagnosis not present

## 2012-09-09 DIAGNOSIS — D509 Iron deficiency anemia, unspecified: Secondary | ICD-10-CM

## 2012-09-09 DIAGNOSIS — E538 Deficiency of other specified B group vitamins: Secondary | ICD-10-CM

## 2012-09-09 DIAGNOSIS — J45909 Unspecified asthma, uncomplicated: Secondary | ICD-10-CM | POA: Diagnosis not present

## 2012-09-09 DIAGNOSIS — K5790 Diverticulosis of intestine, part unspecified, without perforation or abscess without bleeding: Secondary | ICD-10-CM

## 2012-09-09 DIAGNOSIS — K449 Diaphragmatic hernia without obstruction or gangrene: Secondary | ICD-10-CM

## 2012-09-09 DIAGNOSIS — J439 Emphysema, unspecified: Secondary | ICD-10-CM

## 2012-09-09 DIAGNOSIS — I2510 Atherosclerotic heart disease of native coronary artery without angina pectoris: Secondary | ICD-10-CM | POA: Diagnosis not present

## 2012-09-09 DIAGNOSIS — K5730 Diverticulosis of large intestine without perforation or abscess without bleeding: Secondary | ICD-10-CM

## 2012-09-09 DIAGNOSIS — I10 Essential (primary) hypertension: Secondary | ICD-10-CM | POA: Diagnosis not present

## 2012-09-09 DIAGNOSIS — E119 Type 2 diabetes mellitus without complications: Secondary | ICD-10-CM

## 2012-09-09 DIAGNOSIS — E785 Hyperlipidemia, unspecified: Secondary | ICD-10-CM | POA: Diagnosis not present

## 2012-09-09 DIAGNOSIS — K219 Gastro-esophageal reflux disease without esophagitis: Secondary | ICD-10-CM

## 2012-09-09 DIAGNOSIS — I798 Other disorders of arteries, arterioles and capillaries in diseases classified elsewhere: Secondary | ICD-10-CM

## 2012-09-09 DIAGNOSIS — R0989 Other specified symptoms and signs involving the circulatory and respiratory systems: Secondary | ICD-10-CM

## 2012-09-09 DIAGNOSIS — K2970 Gastritis, unspecified, without bleeding: Secondary | ICD-10-CM

## 2012-09-09 DIAGNOSIS — J438 Other emphysema: Secondary | ICD-10-CM

## 2012-09-09 DIAGNOSIS — A048 Other specified bacterial intestinal infections: Secondary | ICD-10-CM

## 2012-09-09 LAB — COMPLETE METABOLIC PANEL WITH GFR
ALT: 8 U/L (ref 0–53)
AST: 11 U/L (ref 0–37)
CO2: 26 mEq/L (ref 19–32)
Calcium: 10 mg/dL (ref 8.4–10.5)
Chloride: 106 mEq/L (ref 96–112)
Creat: 1.15 mg/dL (ref 0.50–1.35)
GFR, Est African American: 74 mL/min
Sodium: 141 mEq/L (ref 135–145)
Total Protein: 7.1 g/dL (ref 6.0–8.3)

## 2012-09-09 LAB — LIPID PANEL
HDL: 51 mg/dL (ref >39)
LDL Cholesterol: 72 mg/dL (ref 0–99)
Triglycerides: 80 mg/dL (ref <150)
VLDL: 16 mg/dL (ref 0–40)

## 2012-09-09 LAB — HEMOGLOBIN A1C: Hgb A1c MFr Bld: 6.9 % — ABNORMAL HIGH (ref <5.7)

## 2012-09-09 MED ORDER — GLUCOSE BLOOD VI STRP: 1.0000 | ORAL_STRIP | Freq: Every day | Status: DC

## 2012-09-09 MED ORDER — GLUCOSE BLOOD VI STRP: ORAL_STRIP | Status: DC

## 2012-09-09 NOTE — Telephone Encounter (Signed)
We sent in glucose blood test strips. They need specific SIG and diagnosis code for insurance billing. Please resend to pharmacy.

## 2012-09-09 NOTE — Telephone Encounter (Signed)
Done

## 2012-09-09 NOTE — Progress Notes (Signed)
Patient ID: Jonathan [name] MRN: [id number], DOB: May 27, 1940, 72 y.o. Date of Encounter: @DATE @  Chief Complaint:  Chief Complaint  Patient presents with  . routine 3 mth check up    HPI: 72 y.o. year old white male  presents for routine followup office visit.  #1 diabetes mellitus: He is still checking his blood sugar 2 or 3 times per week in the mornings and at that time. Fasting morning sugars are still running around 1:30. His nighttime sugars are running good in general he's getting all the readings. #2 hypertension : Taking medications as directed. No adverse effects. He occasionally feels a little bit of lightheadedness when he stands up but this is not significant. He has no lightheadedness at any other times. We have discussed making sure he stands up slowly. 72 His blood pressure is good today and was good at the last visit so we'll not adjust medicines for this.  #3 hyperlipidemia: His last office visit was in June. At that time LDL was at 45 and all other parameters were also good. Therefore at that time his Lipitor dose was decreased from 80-40. He says that he did make this change and is taking 40 mg Lipitor. #4 he only smokes occasionally. Most days he does not smoke at all. #5 asthma: He says this has been stable. He has had no wheezing or difficulty breathing. He is using his Spariva daily.  He used to play golf. However he tells me he has not played much cough for 2 years now. I asked him what he does all day and he reports that he watches TV. He says that he watches games shows in baseball and sports. In the past he was eating lots of Doritos. He says that he is not eating any of these and he has no problems with snacking and munching while watching TV. When he does have exertion he does not have any chest pressure heaviness tightness squeezing. Also no problems with increased shortness of breath or dyspnea on exertion. No lower extremity claudication symptoms. No tingling  burning or pain in his feet to suggest diabetic peripheral neuropathy   Past Medical History  Diagnosis Date  . ED (erectile dysfunction)   . Vitamin D deficiency   . Pancreatitis   . CAD (coronary artery disease)     cath 2001- one vessel occluded (circ); others patent.  EF 55%  . B12 deficiency 06/02/10    174  . Obesity   . Chronic bronchitis   . Emphysema of lung   . Blood transfusion   . Asthma   . Anemia, iron deficiency 06/02/10    heme negative, ferritin 5  . Diabetes mellitus   . GERD (gastroesophageal reflux disease)   . Hyperlipidemia   . Hypertension   . Nephrolithiasis   . Diverticulosis   . H. pylori infection 05/08/2010  . Hiatal hernia 05/08/2010    EGD  . Gastritis 05/08/2010    EGD     Home Meds: See attached medication section for current medication list. Any medications entered into computer today will not appear on this note's list. The medications listed below were entered prior to today. Current Outpatient Prescriptions on File Prior to Visit  Medication Sig Dispense Refill  . albuterol (VENTOLIN HFA) 108 (90 BASE) MCG/ACT inhaler Inhale 2 puffs into the lungs every 6 (six) hours as needed.        Marland Kitchen amLODipine (NORVASC) 10 MG tablet Take 1 tablet (10 mg total) by mouth  daily.  90 tablet  3  . aspirin 81 MG tablet Take 81 mg by mouth daily.        Marland Kitchen atorvastatin (LIPITOR) 40 MG tablet Take 1 tablet (40 mg total) by mouth daily.  90 tablet  0  . cyanocobalamin (,VITAMIN B-12,) 1000 MCG/ML injection Inject 1,000 mcg into the muscle every 30 (thirty) days.        . enalapril (VASOTEC) 20 MG tablet Take 1 tablet (20 mg total) by mouth 2 (two) times daily.  180 tablet  3  . folic acid (FOLVITE) 1 MG tablet Take 1 mg by mouth daily.        Marland Kitchen glipiZIDE (GLUCOTROL XL) 10 MG 24 hr tablet Take 1 tablet (10 mg total) by mouth daily.  90 tablet  3  . hydrOXYzine (ATARAX) 50 MG tablet Take 50 mg by mouth 3 (three) times daily as needed. itching       . iron  polysaccharides (NU-IRON) 150 MG capsule Take 150 mg by mouth 2 (two) times daily.        . metFORMIN (GLUCOPHAGE) 1000 MG tablet Take 1 tablet (1,000 mg total) by mouth 2 (two) times daily with a meal.  180 tablet  3  . omeprazole (PRILOSEC) 20 MG capsule Take 1 tablet by mouth twice a day x 10 days, then return to 1 daily  20 capsule  0  . pioglitazone (ACTOS) 45 MG tablet Take 1 tablet (45 mg total) by mouth daily.  90 tablet  3  . tiotropium (SPIRIVA) 18 MCG inhalation capsule Place 18 mcg into inhaler and inhale daily.        . valsartan (DIOVAN) 320 MG tablet Take 1 tablet (320 mg total) by mouth daily.  90 tablet  3   No current facility-administered medications on file prior to visit.    Allergies: No Known Allergies  History   Social History  . Marital Status: Single    Spouse Name: N/A    Number of Children: N/A  . Years of Education: N/A   Occupational History  . Not on file.   Social History Main Topics  . Smoking status: Current Some Day Smoker    Types: Cigarettes  . Smokeless tobacco: Never Used  . Alcohol Use: No  . Drug Use: No  . Sexual Activity: Not on file   Other Topics Concern  . Not on file   Social History Narrative  . No narrative on file    Family History  Problem Relation Age of Onset  . Coronary artery disease Mother      Review of Systems:  See HPI for pertinent ROS. All other ROS negative.    Physical Exam: Blood pressure 136/74, pulse 84, temperature 97.5 F (36.4 C), temperature source Oral, resp. rate 18, weight 201 lb (91.173 kg)., Body mass index is 30.11 kg/(m^2). General:  well-nourished well-developed white male .Appears in no acute distress. Neck: Supple. No thyromegaly. No lymphadenopathy.Left carotid artery with soft bruit. No bruit on the right.  Lungs: Clear bilaterally to auscultation without wheezes, rales, or rhonchi. Breathing is unlabored. Heart: RRR with S1 S2. No murmurs, rubs, or gallops. Abdomen: Soft,  non-tender, non-distended with normoactive bowel sounds. No hepatomegaly. No rebound/guarding. No obvious abdominal masses. Musculoskeletal:  Strength and tone normal for age. Extremities/Skin: Warm and dry. No clubbing or cyanosis. No edema. No rashes or suspicious lesions. Neuro: Alert and oriented X 3. Moves all extremities spontaneously. Gait is normal. CNII-XII grossly in tact. Psych:  Responds to questions appropriately with a normal affect. Diabetic foot exam: See attached section also. He has very good footcare. There are no open wounds or sores in this. Sensation is intact and normal.  2+ bilateral dorsalis pedis and posterior tibial pulses bilaterally.      ASSESSMENT AND PLAN:  72 y.o. year old male with  1. Anemia, iron deficiency C. no from 12/03/2010 for complete details of this. Labs rechecked in May 2013 and hemoglobin and hematocrit were back to normal at that time. He is continued on iron supplements and is getting B12 injections as below. As well his diet has improved.  2. Asthma, chronic He is on Spiriva. This is his asthma is stable. He is not he needed his rescue inhaler  3. B12 deficiency We checked the vitamin B12 level at his June office visit. His level came back at 458 which is in within normal limits on injections.Marland Kitchen He is continuing monthly injections.  4. CAD, NATIVE VESSEL  No current angina symptoms. This is stable.  5. Diverticulosis  6. DM (diabetes mellitus), secondary, uncontrolled, with peripheral vascular complications He had microalbumin 12/10/2011. - COMPLETE METABOLIC PANEL WITH GFR - Hemoglobin A1c The exam is normal. He has no such symptoms of peripheral neuropathy.  His blood sugars are controlled and his recent A1c's have been good. We'll recheck A1c. He is on statin and ACE inhibitor therapy.   7. Emphysema of lung Stable with Spiriva  8. Gastritis On EGD 5 /2012  9. GERD (gastroesophageal reflux disease) Controlled with  Prilosec  10. H. pylori infection History of this in the past. Resolved.  11. Hiatal hernia On EGD 05/2010  12. Hyperlipidemia At his last office visit in June his LDL came back at 45. All other parameters were also at goal/low. Therefore his Lipitor dose was decreased from 80-40. He says that he did decrease his dose starting in June and has been taking just 40 mg since. Therefore we will recheck the levels today at this dose. - COMPLETE METABOLIC PANEL WITH GFR - Lipid panel  13. Hypertension At goal. On ACE inhibitor. He has some lightheadedness very rarely but only when standing up. I have told him that he needs to go from lying to standing or sitting to standing very slowly. His blood pressure readings were good at the last office visit and again today from not going to adjust his medications. He says that the lightheadedness is very rare and mild - COMPLETE METABOLIC PANEL WITH GFR  14. Right carotid bruit History of carotid ultrasounds 2/ 2013- </= less than/equal to 50% stenosis bilaterally.  #15 prostate cancer screening: Last PSA was performed June 2014 at his last office visit. PSA was normal at 0.82.  16 vitamin D deficiency: Recheck his vitamin D level June 2014. The level came back at 45 on his current dose of vitamin D. Continue current dose  #17 colonoscopy: 2012: Repeat 10 years.  #18 vaccines: Tetanus: Discussed 12/09/11: Patient deferred Pneumovax: Had in 2003 Zostavax: Had 03/09/2012  Regular office visit 3 months sooner as needed.   Signed, [address] [place], [name], [facility] 09/09/2012 9:15 AM

## 2012-09-30 ENCOUNTER — Ambulatory Visit (INDEPENDENT_AMBULATORY_CARE_PROVIDER_SITE_OTHER): Payer: Medicare Other | Admitting: Family Medicine

## 2012-09-30 DIAGNOSIS — E559 Vitamin D deficiency, unspecified: Secondary | ICD-10-CM | POA: Diagnosis not present

## 2012-09-30 DIAGNOSIS — Z23 Encounter for immunization: Secondary | ICD-10-CM

## 2012-09-30 MED ORDER — CYANOCOBALAMIN 1000 MCG/ML IJ SOLN
1000.0000 ug | Freq: Once | INTRAMUSCULAR | Status: AC
  Administered 2012-09-30: 1000 ug via INTRAMUSCULAR

## 2012-10-12 ENCOUNTER — Encounter: Payer: Self-pay | Admitting: Physician Assistant

## 2012-10-12 ENCOUNTER — Ambulatory Visit (INDEPENDENT_AMBULATORY_CARE_PROVIDER_SITE_OTHER): Payer: Medicare Other | Admitting: Physician Assistant

## 2012-10-12 VITALS — BP 152/84 | HR 86 | Temp 97.8°F | Resp 18 | Wt 202.0 lb

## 2012-10-12 DIAGNOSIS — K2970 Gastritis, unspecified, without bleeding: Secondary | ICD-10-CM

## 2012-10-12 DIAGNOSIS — J438 Other emphysema: Secondary | ICD-10-CM

## 2012-10-12 DIAGNOSIS — J439 Emphysema, unspecified: Secondary | ICD-10-CM

## 2012-10-12 DIAGNOSIS — E1351 Other specified diabetes mellitus with diabetic peripheral angiopathy without gangrene: Secondary | ICD-10-CM

## 2012-10-12 DIAGNOSIS — K449 Diaphragmatic hernia without obstruction or gangrene: Secondary | ICD-10-CM

## 2012-10-12 DIAGNOSIS — I10 Essential (primary) hypertension: Secondary | ICD-10-CM | POA: Diagnosis not present

## 2012-10-12 DIAGNOSIS — K219 Gastro-esophageal reflux disease without esophagitis: Secondary | ICD-10-CM

## 2012-10-12 DIAGNOSIS — E785 Hyperlipidemia, unspecified: Secondary | ICD-10-CM

## 2012-10-12 DIAGNOSIS — E538 Deficiency of other specified B group vitamins: Secondary | ICD-10-CM

## 2012-10-12 DIAGNOSIS — R6889 Other general symptoms and signs: Secondary | ICD-10-CM

## 2012-10-12 DIAGNOSIS — I2510 Atherosclerotic heart disease of native coronary artery without angina pectoris: Secondary | ICD-10-CM

## 2012-10-12 DIAGNOSIS — D509 Iron deficiency anemia, unspecified: Secondary | ICD-10-CM

## 2012-10-12 DIAGNOSIS — I798 Other disorders of arteries, arterioles and capillaries in diseases classified elsewhere: Secondary | ICD-10-CM | POA: Diagnosis not present

## 2012-10-12 DIAGNOSIS — F0391 Unspecified dementia with behavioral disturbance: Secondary | ICD-10-CM

## 2012-10-12 DIAGNOSIS — R0989 Other specified symptoms and signs involving the circulatory and respiratory systems: Secondary | ICD-10-CM

## 2012-10-12 LAB — ANEMIA PANEL
%SAT: 23 % (ref 20–55)
ABS Retic: 56.8 10*3/uL (ref 19.0–186.0)
Iron: 97 ug/dL (ref 42–165)
UIBC: 329 ug/dL (ref 125–400)
Vitamin B-12: 576 pg/mL (ref 211–911)

## 2012-10-12 LAB — COMPLETE METABOLIC PANEL WITH GFR
Albumin: 4.5 g/dL (ref 3.5–5.2)
Alkaline Phosphatase: 90 U/L (ref 39–117)
BUN: 15 mg/dL (ref 6–23)
CO2: 26 mEq/L (ref 19–32)
GFR, Est African American: 75 mL/min
GFR, Est Non African American: 65 mL/min
Glucose, Bld: 139 mg/dL — ABNORMAL HIGH (ref 70–99)
Sodium: 144 mEq/L (ref 135–145)
Total Bilirubin: 0.4 mg/dL (ref 0.3–1.2)
Total Protein: 7.2 g/dL (ref 6.0–8.3)

## 2012-10-12 LAB — TSH: TSH: 1.113 u[IU]/mL (ref 0.350–4.500)

## 2012-10-12 MED ORDER — CYANOCOBALAMIN 1000 MCG/ML IJ SOLN
1000.0000 ug | Freq: Once | INTRAMUSCULAR | Status: AC
  Administered 2012-10-12: 1000 ug via INTRAMUSCULAR

## 2012-10-12 NOTE — Progress Notes (Signed)
Patient ID: LE FAULCON MRN: [id number], DOB: Sep 03, 1940, 72 y.o. Date of Encounter: @DATE @  Chief Complaint:  Chief Complaint  Patient presents with  . memory problems    progressively worsening x 2 years, wife states he is coughing all the time    HPI: 72 y.o. year old white male  presents with his wife today. I have seen Mr. Crossan on a routine basis for years but have never met his wife prior to today.  She reports most of the history today. She reports that he has been having some memory problems and confusion for about 2 years. He said his the same questions over and over and sometimes does not know the date. She says that things happened over the last couple days that made the final decision for her to bring him in. One instance was just a couple days ago he asked his wife about a phone call from their daughter. However the wife was quite certain that the daughter had not called. The wife then called the daughter herself and verified with the daughter that in fact the daughter had not called at all. The second episode was just yesterday. Both the wife and Mr. Furukawa were in the house. The wife reports that she thought it was unusual because usually Mr. Macera is sitting in his recliner all day. She walked by that room and saw that he was not there.  She then  thought he must be in the bathroom or something. However she checked the bathroom and he was not there.  she check the garage to see if he stepped out to smoke and he was not there. She then realized that his truck was missing. As it turns out he had driven to [place] club[place] reporting that his son had called and told him to go there to meet him. However none of this was true according to the wife. His son was not even at the [employer]. The son had not called him. Nobody from the Clubhouse had called him.  Wife is not sure whether Mr. Sliwa had drifted off to sleep and dreamed these things or what. Both patient and wife report  that prior to these 2 episodes he had never had any known episodes of hearing any voices or anything that was not truly present. No history of schizophrenia are similar patterns. No history of delusions or hallucinations.  As well the wife says that she wants me to be aware of the following: Says the patient does not eat a good diet. Is that he will go to [employer] or somewhere in get him up breakfast including biscuits and gravy. Then he will not eat any lunch. Says he never checks his blood sugar. Says he does not type of exercise or activity anymore. He says he used to play golf but then had problems with his knee to quit doing that. She has tried to encourage him to leashes go up to the golf club and to socialize to have some stimulation but says that he is not even interested in this. If he sits in a recliner all day. Also reports that often he will say he is having difficulty with computer and can get her to help and then she goes to the computer and is working just fine. Also says he even has problems using the remote for the TV.  She also says that he sneaks out to the garage and smokes quite often. Has a chronic cough with phlegm.  Past Medical History  Diagnosis Date  . ED (erectile dysfunction)   . Vitamin D deficiency   . Pancreatitis   . CAD (coronary artery disease)     cath 2001- one vessel occluded (circ); others patent.  EF 55%  . B12 deficiency 06/02/10    174  . Obesity   . Chronic bronchitis   . Emphysema of lung   . Blood transfusion   . Asthma   . Anemia, iron deficiency 06/02/10    heme negative, ferritin 5  . Diabetes mellitus   . GERD (gastroesophageal reflux disease)   . Hyperlipidemia   . Hypertension   . Nephrolithiasis   . Diverticulosis   . H. pylori infection 05/08/2010  . Hiatal hernia 05/08/2010    EGD  . Gastritis 05/08/2010    EGD     Home Meds: See attached medication section for current medication list. Any medications entered into computer today  will not appear on this note's list. The medications listed below were entered prior to today. Current Outpatient Prescriptions on File Prior to Visit  Medication Sig Dispense Refill  . albuterol (VENTOLIN HFA) 108 (90 BASE) MCG/ACT inhaler Inhale 2 puffs into the lungs every 6 (six) hours as needed.        Marland Kitchen amLODipine (NORVASC) 10 MG tablet Take 1 tablet (10 mg total) by mouth daily.  90 tablet  3  . aspirin 81 MG tablet Take 81 mg by mouth daily.        Marland Kitchen atorvastatin (LIPITOR) 40 MG tablet Take 1 tablet (40 mg total) by mouth daily.  90 tablet  0  . cyanocobalamin (,VITAMIN B-12,) 1000 MCG/ML injection Inject 1,000 mcg into the muscle every 30 (thirty) days.        . enalapril (VASOTEC) 20 MG tablet Take 1 tablet (20 mg total) by mouth 2 (two) times daily.  180 tablet  3  . glipiZIDE (GLUCOTROL XL) 10 MG 24 hr tablet Take 1 tablet (10 mg total) by mouth daily.  90 tablet  3  . metFORMIN (GLUCOPHAGE) 1000 MG tablet Take 1 tablet (1,000 mg total) by mouth 2 (two) times daily with a meal.  180 tablet  3  . pioglitazone (ACTOS) 45 MG tablet Take 1 tablet (45 mg total) by mouth daily.  90 tablet  3  . valsartan (DIOVAN) 320 MG tablet Take 1 tablet (320 mg total) by mouth daily.  90 tablet  3  . folic acid (FOLVITE) 1 MG tablet Take 1 mg by mouth daily.        Marland Kitchen glucose blood test strip 1 each by Other route daily.  100 each  12  . hydrOXYzine (ATARAX) 50 MG tablet Take 50 mg by mouth 3 (three) times daily as needed. itching       . iron polysaccharides (NU-IRON) 150 MG capsule Take 150 mg by mouth 2 (two) times daily.        Marland Kitchen omeprazole (PRILOSEC) 20 MG capsule Take 1 tablet by mouth twice a day x 10 days, then return to 1 daily  20 capsule  0  . tiotropium (SPIRIVA) 18 MCG inhalation capsule Place 18 mcg into inhaler and inhale daily.         No current facility-administered medications on file prior to visit.    Allergies: No Known Allergies  History   Social History  . Marital Status:  Single    Spouse Name: N/A    Number of Children: N/A  . Years of  Education: N/A   Occupational History  . Not on file.   Social History Main Topics  . Smoking status: Current Some Day Smoker    Types: Cigarettes  . Smokeless tobacco: Never Used  . Alcohol Use: No  . Drug Use: No  . Sexual Activity: Not on file   Other Topics Concern  . Not on file   Social History Narrative  . No narrative on file    Family History  Problem Relation Age of Onset  . Coronary artery disease Mother      Review of Systems:  See HPI for pertinent ROS. All other ROS negative.    Physical Exam: Blood pressure 152/84, pulse 86, temperature 97.8 F (36.6 C), temperature source Oral, resp. rate 18, weight 202 lb (91.627 kg)., Body mass index is 30.26 kg/(m^2). General: A large well-developed white male .Appears in no acute distress. Neck: Supple. No thyromegaly. No lymphadenopathy. Soft Carotid bruit on the right. Left without bruit. Lungs: Clear bilaterally to auscultation without wheezes, rales, or rhonchi. Breathing is unlabored. Heart: RRR with S1 S2. No murmurs, rubs, or gallops. Musculoskeletal:  Strength and tone normal for age. Extremities/Skin: Warm and dry. No clubbing or cyanosis. No edema.  Neuro:  Moves all extremities spontaneously. Gait is normal. CNII-XII grossly in tact. 5/5 Bilateral upper extremity strength. 5/5 bilateral grip strength. 5/5 bilateral lower extremity strength. Romberg test is normal. Psych:  Responds to questions appropriately with a normal affect.     ASSESSMENT AND PLAN:  72 y.o. year old male with  1. Dementia with behavioral disturbance  Mini-Mental exam is performed and will be scanned into the computer. Total score is 19 which is the better end of the range for moderate dementia. We'll obtain labs, chest x-ray, head CT. Patient and wife will come for followup office visit with me next week. If test show any abnormality then this will be addressed when  we get the test results. If tests are normal then at that office visit we will discuss initiation of medications for dementia.  - TSH - Anemia panel - COMPLETE METABOLIC PANEL WITH GFR - CT Head W Wo Contrast; Future  2. Cold intolerance - TSH - Anemia panel - COMPLETE METABOLIC PANEL WITH GFR  3. Anemia, iron deficiency - Anemia panel  4. B12 deficiency - Anemia panel - cyanocobalamin ((VITAMIN B-12)) injection 1,000 mcg; Inject 1 mL (1,000 mcg total) into the muscle once.  5. Emphysema of lung - DG Chest 2 View; Future  6. CAD, NATIVE VESSEL   7. DM (diabetes mellitus), secondary, uncontrolled, with peripheral vascular complications - COMPLETE METABOLIC PANEL WITH GFR  8. GERD (gastroesophageal reflux disease)  9. Hyperlipidemia - COMPLETE METABOLIC PANEL WITH GFR  10. Hypertension - COMPLETE METABOLIC PANEL WITH GFR  11. Right carotid bruit 12. Hiatal hernia 13. Gastritis    Signed, [address] [place], [name], [facility] 10/12/2012 11:01 AM

## 2012-10-13 ENCOUNTER — Ambulatory Visit
Admission: RE | Admit: 2012-10-13 | Discharge: 2012-10-13 | Disposition: A | Discharge status: Home / Self Care | Payer: Medicare Other | Source: Ambulatory Visit | Attending: Physician Assistant | Admitting: Physician Assistant

## 2012-10-13 DIAGNOSIS — F0391 Unspecified dementia with behavioral disturbance: Secondary | ICD-10-CM

## 2012-10-13 DIAGNOSIS — R05 Cough: Secondary | ICD-10-CM | POA: Diagnosis not present

## 2012-10-13 DIAGNOSIS — R413 Other amnesia: Secondary | ICD-10-CM | POA: Diagnosis not present

## 2012-10-13 DIAGNOSIS — J439 Emphysema, unspecified: Secondary | ICD-10-CM

## 2012-10-13 DIAGNOSIS — R0602 Shortness of breath: Secondary | ICD-10-CM | POA: Diagnosis not present

## 2012-10-13 DIAGNOSIS — R269 Unspecified abnormalities of gait and mobility: Secondary | ICD-10-CM | POA: Diagnosis not present

## 2012-10-13 MED ORDER — IOHEXOL 300 MG/ML  SOLN
75.0000 mL | Freq: Once | INTRAMUSCULAR | Status: AC | PRN
  Administered 2012-10-13: 75 mL via INTRAVENOUS

## 2012-10-19 ENCOUNTER — Encounter: Payer: Self-pay | Admitting: Physician Assistant

## 2012-10-19 ENCOUNTER — Ambulatory Visit (INDEPENDENT_AMBULATORY_CARE_PROVIDER_SITE_OTHER): Payer: Medicare Other | Admitting: Physician Assistant

## 2012-10-19 DIAGNOSIS — D509 Iron deficiency anemia, unspecified: Secondary | ICD-10-CM

## 2012-10-19 DIAGNOSIS — J439 Emphysema, unspecified: Secondary | ICD-10-CM

## 2012-10-19 DIAGNOSIS — F0150 Vascular dementia without behavioral disturbance: Secondary | ICD-10-CM

## 2012-10-19 DIAGNOSIS — F17200 Nicotine dependence, unspecified, uncomplicated: Secondary | ICD-10-CM | POA: Insufficient documentation

## 2012-10-19 DIAGNOSIS — J438 Other emphysema: Secondary | ICD-10-CM | POA: Diagnosis not present

## 2012-10-19 DIAGNOSIS — F0390 Unspecified dementia without behavioral disturbance: Secondary | ICD-10-CM | POA: Insufficient documentation

## 2012-10-19 DIAGNOSIS — F0280 Dementia in other diseases classified elsewhere without behavioral disturbance: Secondary | ICD-10-CM

## 2012-10-19 HISTORY — DX: Unspecified dementia, unspecified severity, without behavioral disturbance, psychotic disturbance, mood disturbance, and anxiety: F03.90

## 2012-10-19 MED ORDER — VARENICLINE TARTRATE 1 MG PO TABS: 1.0000 mg | ORAL_TABLET | Freq: Two times a day (BID) | ORAL | Status: DC

## 2012-10-19 MED ORDER — VARENICLINE TARTRATE 0.5 MG PO TABS: 0.5000 mg | ORAL_TABLET | Freq: Two times a day (BID) | ORAL | Status: DC

## 2012-10-19 MED ORDER — POLYSACCHARIDE IRON COMPLEX 150 MG PO CAPS
150.0000 mg | ORAL_CAPSULE | Freq: Two times a day (BID) | ORAL | Status: DC
Start: 2012-10-19 — End: 2013-04-19

## 2012-10-19 NOTE — Progress Notes (Signed)
Patient ID: Jonathan [name] MRN: [id number], DOB: September 06, 1940, 72 y.o. Date of Encounter: @DATE @  Chief Complaint:  Chief Complaint  Patient presents with  . 1 week followup to discuss results    HPI: 72 y.o. year old white male  presents with his wife again today to followup from his office visit last week.  Mr. Tallon has been seeing me on a three-month basis for years. However, I had never met his wife until last week. At That time, she came in with the patient because she was concerned about noticing some issues with his memory and also was concerned because he was still smoking.  said he was supposed to have stopped smoking years ago. But,  in fact, he only stopped smoking in the house at that time. Since then he has been sneaking out to the garage to smoke as if no one noticed that he was then entering it back into the house reeking a smoke. At that visit we did a Mini-Mental exam which which was consistent with moderate dementia. As well we ordered labs as well as CT of the head and chest x-ray to evaluate his cough. They're here today to followup. No new complaints today. Wife reports that he is been stable over the past week regarding his dementia. Has not reported any thing that was untrue over the past week.   Past Medical History  Diagnosis Date  . ED (erectile dysfunction)   . Vitamin D deficiency   . Pancreatitis   . CAD (coronary artery disease)     cath 2001- one vessel occluded (circ); others patent.  EF 55%  . B12 deficiency 06/02/10    174  . Obesity   . Chronic bronchitis   . Emphysema of lung   . Blood transfusion   . Asthma   . Anemia, iron deficiency 06/02/10    heme negative, ferritin 5  . Diabetes mellitus   . GERD (gastroesophageal reflux disease)   . Hyperlipidemia   . Hypertension   . Nephrolithiasis   . Diverticulosis   . H. pylori infection 05/08/2010  . Hiatal hernia 05/08/2010    EGD  . Gastritis 05/08/2010    EGD  . Smoker   . Dementia 10/19/2012      Home Meds: See attached medication section for current medication list. Any medications entered into computer today will not appear on this note's list. The medications listed below were entered prior to today. Current Outpatient Prescriptions on File Prior to Visit  Medication Sig Dispense Refill  . albuterol (VENTOLIN HFA) 108 (90 BASE) MCG/ACT inhaler Inhale 2 puffs into the lungs every 6 (six) hours as needed.        Marland Kitchen amLODipine (NORVASC) 10 MG tablet Take 1 tablet (10 mg total) by mouth daily.  90 tablet  3  . aspirin 81 MG tablet Take 81 mg by mouth daily.        Marland Kitchen atorvastatin (LIPITOR) 40 MG tablet Take 1 tablet (40 mg total) by mouth daily.  90 tablet  0  . cyanocobalamin (,VITAMIN B-12,) 1000 MCG/ML injection Inject 1,000 mcg into the muscle every 30 (thirty) days.        . enalapril (VASOTEC) 20 MG tablet Take 1 tablet (20 mg total) by mouth 2 (two) times daily.  180 tablet  3  . folic acid (FOLVITE) 1 MG tablet Take 1 mg by mouth daily.        Marland Kitchen glipiZIDE (GLUCOTROL XL) 10 MG 24 hr  tablet Take 1 tablet (10 mg total) by mouth daily.  90 tablet  3  . glucose blood test strip 1 each by Other route daily.  100 each  12  . hydrOXYzine (ATARAX) 50 MG tablet Take 50 mg by mouth 3 (three) times daily as needed. itching       . metFORMIN (GLUCOPHAGE) 1000 MG tablet Take 1 tablet (1,000 mg total) by mouth 2 (two) times daily with a meal.  180 tablet  3  . omeprazole (PRILOSEC) 20 MG capsule Take 1 tablet by mouth twice a day x 10 days, then return to 1 daily  20 capsule  0  . pioglitazone (ACTOS) 45 MG tablet Take 1 tablet (45 mg total) by mouth daily.  90 tablet  3  . tiotropium (SPIRIVA) 18 MCG inhalation capsule Place 18 mcg into inhaler and inhale daily.        . valsartan (DIOVAN) 320 MG tablet Take 1 tablet (320 mg total) by mouth daily.  90 tablet  3   No current facility-administered medications on file prior to visit.    Allergies: No Known Allergies  History   Social  History  . Marital Status: Married    Spouse Name: N/A    Number of Children: N/A  . Years of Education: N/A   Occupational History  . Not on file.   Social History Main Topics  . Smoking status: Current Some Day Smoker    Types: Cigarettes  . Smokeless tobacco: Never Used  . Alcohol Use: No  . Drug Use: No  . Sexual Activity: Not on file   Other Topics Concern  . Not on file   Social History Narrative  . No narrative on file    Family History  Problem Relation Age of Onset  . Coronary artery disease Mother      Review of Systems:  See HPI for pertinent ROS. All other ROS negative.    Physical Exam: There were no vitals taken for this visit., There is no weight on file to calculate BMI. General: Well nourished,  well-developed white male Appears in no acute distress. Head: Normocephalic, atraumatic, eyes without discharge, sclera non-icteric, nares are without discharge. Bilateral auditory canals clear, TM's are without perforation, pearly grey and translucent with reflective cone of light bilaterally. Oral cavity moist, posterior pharynx without exudate, erythema, peritonsillar abscess, or post nasal drip.  Neck: Supple. No thyromegaly. No lymphadenopathy. Lungs: Clear bilaterally to auscultation without wheezes, rales, or rhonchi. Breathing is unlabored.Distant Breath sounds but clear.  Heart: RRR with S1 S2. No murmurs, rubs, or gallops. Abdomen: Soft, non-tender, non-distended with normoactive bowel sounds. No hepatomegaly. No rebound/guarding. No obvious abdominal masses. Musculoskeletal:  Strength and tone normal for age. Extremities/Skin: Warm and dry. No clubbing or cyanosis. No edema. No rashes or suspicious lesions. Neuro: Alert and oriented X 3. Moves all extremities spontaneously. Gait is normal. CNII-XII grossly in tact. Psych:  Responds to questions appropriately with a normal affect.     ASSESSMENT AND PLAN:  72 y.o. year old male with  1.  Dementia  2. Smoker - varenicline (CHANTIX) 0.5 MG tablet; Take 1 tablet (0.5 mg total) by mouth 2 (two) times daily.  Dispense: 60 tablet; Refill: 0 - varenicline (CHANTIX) 1 MG tablet; Take 1 tablet (1 mg total) by mouth 2 (two) times daily.  Dispense: 60 tablet; Refill: 3  3. Emphysema of lung  4. Anemia, iron deficiency - iron polysaccharides (NU-IRON) 150 MG capsule; Take 1 capsule (150  mg total) by mouth 2 (two) times daily.  Dispense: 60 capsule; Refill: 11  His last visit on 10/12/12 the following labs were obtained:  1 anemia profile: He has a remote history of a hospitalization at which time he had mixed types of anemia including iron deficiency, folate deficiency, and but B12 deficiency. He is still on routine B12 injections. His current B12 level was at normal range cytology and continue his current dose of B12 injections. His folate level remains normal off of folic acid thoracotomy can remain off of this. He has been off of folic acid about one year. His iron is normal but his ferritin was low so told him to restart his iron.  2. TSH was normal. 3. CMET normal.  Chest x-ray was obtained given his history of smoking and chronic cough. This showed emphysema but no other abnormality.  CT Head : No acute abnormality. Chronic small vessel ischemic disease including a tiny old lacunar and left side of the pons. There was moderate cerebral cortical atrophy. Also extensive periventricular white matter lucency consistent with chronic small vessel ischemic disease.  He is on a baby aspirin as well as medicines for his cholesterol and diabetes. Discussed other things to do to reduce his risk of cardiovascular and ischemic vascular disease to his brain. Discussed need for smoking cessation secondary to these effects as well as effects on his emphysema and other systemic effects. Patient reports that he does want to quit smoking and is interested in taking Chantix for this.  I discussed  the directions for using the chances are that the patient and the wife. Also discussed possible side effects including changes in mood or behavior including depression or suicidal ideation. Discussed with them to notify us immediately if he does develop these. Also notify me if he has any other adverse effects.  Otherwise, assuming he is able to tolerate a Chantix, we will refer him to complete this prior to initiating the medicines for dementia.  We want to be able to monitor for adverse effects and [place] starting multiple medications at the same time.  Also discussed the medications available for dementia. Including Aricept and Namenda. Discussed that these work in different ways and works synergistically together. Discussed expectations of the medications. Discussed that they will not reverse the dementia but at best we'll slow the progression of the disease.  He will followup with me in 3 months time. At that time then we can discuss starting the Aricept and then later adding  Namenda.  Signed, [address] [place], [name], [facility] 10/19/2012 10:33 AM

## 2012-11-13 ENCOUNTER — Ambulatory Visit (INDEPENDENT_AMBULATORY_CARE_PROVIDER_SITE_OTHER): Payer: Medicare Other | Admitting: Family Medicine

## 2012-11-13 DIAGNOSIS — E538 Deficiency of other specified B group vitamins: Secondary | ICD-10-CM

## 2012-11-13 MED ORDER — CYANOCOBALAMIN 1000 MCG/ML IJ SOLN
1000.0000 ug | Freq: Once | INTRAMUSCULAR | Status: AC
  Administered 2012-11-13: 1000 ug via INTRAMUSCULAR

## 2012-12-09 ENCOUNTER — Ambulatory Visit: Payer: Medicare Other | Admitting: Physician Assistant

## 2012-12-14 ENCOUNTER — Ambulatory Visit (INDEPENDENT_AMBULATORY_CARE_PROVIDER_SITE_OTHER): Payer: Medicare Other | Admitting: Physician Assistant

## 2012-12-14 ENCOUNTER — Encounter: Payer: Self-pay | Admitting: Physician Assistant

## 2012-12-14 ENCOUNTER — Telehealth: Payer: Self-pay | Admitting: Family Medicine

## 2012-12-14 VITALS — BP 140/76 | HR 68 | Temp 98.3°F | Resp 18 | Wt 199.0 lb

## 2012-12-14 DIAGNOSIS — E538 Deficiency of other specified B group vitamins: Secondary | ICD-10-CM

## 2012-12-14 DIAGNOSIS — I2510 Atherosclerotic heart disease of native coronary artery without angina pectoris: Secondary | ICD-10-CM | POA: Diagnosis not present

## 2012-12-14 DIAGNOSIS — K219 Gastro-esophageal reflux disease without esophagitis: Secondary | ICD-10-CM

## 2012-12-14 DIAGNOSIS — I10 Essential (primary) hypertension: Secondary | ICD-10-CM

## 2012-12-14 DIAGNOSIS — F0390 Unspecified dementia without behavioral disturbance: Secondary | ICD-10-CM

## 2012-12-14 DIAGNOSIS — I798 Other disorders of arteries, arterioles and capillaries in diseases classified elsewhere: Secondary | ICD-10-CM

## 2012-12-14 DIAGNOSIS — D518 Other vitamin B12 deficiency anemias: Secondary | ICD-10-CM

## 2012-12-14 DIAGNOSIS — R0989 Other specified symptoms and signs involving the circulatory and respiratory systems: Secondary | ICD-10-CM

## 2012-12-14 DIAGNOSIS — K5730 Diverticulosis of large intestine without perforation or abscess without bleeding: Secondary | ICD-10-CM

## 2012-12-14 DIAGNOSIS — E785 Hyperlipidemia, unspecified: Secondary | ICD-10-CM

## 2012-12-14 DIAGNOSIS — K449 Diaphragmatic hernia without obstruction or gangrene: Secondary | ICD-10-CM

## 2012-12-14 DIAGNOSIS — K5790 Diverticulosis of intestine, part unspecified, without perforation or abscess without bleeding: Secondary | ICD-10-CM

## 2012-12-14 DIAGNOSIS — K2970 Gastritis, unspecified, without bleeding: Secondary | ICD-10-CM

## 2012-12-14 DIAGNOSIS — IMO0002 Reserved for concepts with insufficient information to code with codable children: Secondary | ICD-10-CM

## 2012-12-14 DIAGNOSIS — E1351 Other specified diabetes mellitus with diabetic peripheral angiopathy without gangrene: Secondary | ICD-10-CM | POA: Diagnosis not present

## 2012-12-14 DIAGNOSIS — F17200 Nicotine dependence, unspecified, uncomplicated: Secondary | ICD-10-CM

## 2012-12-14 DIAGNOSIS — D509 Iron deficiency anemia, unspecified: Secondary | ICD-10-CM

## 2012-12-14 DIAGNOSIS — J438 Other emphysema: Secondary | ICD-10-CM

## 2012-12-14 DIAGNOSIS — J439 Emphysema, unspecified: Secondary | ICD-10-CM

## 2012-12-14 DIAGNOSIS — D519 Vitamin B12 deficiency anemia, unspecified: Secondary | ICD-10-CM

## 2012-12-14 LAB — HEMOGLOBIN A1C, FINGERSTICK: Hgb A1C (fingerstick): 5.8 % — ABNORMAL HIGH (ref <5.7)

## 2012-12-14 MED ORDER — CYANOCOBALAMIN 1000 MCG/ML IJ SOLN
1000.0000 ug | Freq: Once | INTRAMUSCULAR | Status: AC
  Administered 2012-12-14: 1000 ug via INTRAMUSCULAR

## 2012-12-14 MED ORDER — DONEPEZIL HCL 5 MG PO TABS: 5.0000 mg | ORAL_TABLET | Freq: Every day | ORAL | Status: DC

## 2012-12-14 NOTE — Telephone Encounter (Signed)
Message copied by Donne Anon on Mon Dec 14, 2012  4:56 PM ------      Message from: [name]      Created: Mon Dec 14, 2012  4:50 PM       A1c has dropped. I am concerned he is going to have hypoglycemia. Stop the glipizide/Glucotrol. ------

## 2012-12-14 NOTE — Telephone Encounter (Signed)
Pt aware of lab results and provider recommendations. Told to stop Glipizide, med removed from med list.

## 2012-12-14 NOTE — Progress Notes (Signed)
Patient ID: Jonathan [name] MRN: [id number], DOB: September 21, 1940, 72 y.o. Date of Encounter: @DATE @  Chief Complaint:  Chief Complaint  Patient presents with  . 72 mth check up    is fasting    HPI: 72 y.o. year old white male  presents with his wife again for office visit today.  I have been seeing him on a three-month basis for years. However I had never met his wife until she came with him for an office visit in October 2014. At that time she came in with the patient because she was concerned - she has noted some issues with his memory and also concerned because he was smoking. She said that he supposedly had stopped smoking years ago. But in fact he only stopped smoking in the house at that time. She reported that since then he had been sneaking out of the house into the garage to smoke "as if no one noticed that he was then entering back into the house reeking of smoke."  At that visit in October we performed a Mini-Mental exam which was consistent with moderate dementia. Also obtained head CT and labs. Also obtained chest x-ray to evaluate cough.  Both the patient and wife came in for followup office visit on 10/19/12 to discuss his test results.  CBC, TSH, CMET were all stable/normal. Chest x-ray showed emphysema but no other abnormality. CT head: No acute abnormality. Chronic small vessel ischemic disease including a tiny old lacunar infarct left side of the pons. There was moderate cerebral cortical atrophy. Also extensive periventricular white matter lucency consistent with chronic small vessel ischemic disease.  At the visit 10/19/12 pt and wife wanted to  use medication to help with smoking cessation prior to starting medicines for the dementia. Prescribed Chantix.  Today his wife states that he tolerated the starting dose pack of Chantix fine. However when he increased to the higher dose he developed worsening confusion. therefore she stopped the Chantix. He has quit the  smoking.    Past Medical History  Diagnosis Date  . ED (erectile dysfunction)   . Vitamin D deficiency   . Pancreatitis   . CAD (coronary artery disease)     cath 2001- one vessel occluded (circ); others patent.  EF 55%  . B12 deficiency 06/02/10    174  . Obesity   . Chronic bronchitis   . Emphysema of lung   . Blood transfusion   . Asthma   . Anemia, iron deficiency 06/02/10    heme negative, ferritin 5  . Diabetes mellitus   . GERD (gastroesophageal reflux disease)   . Hyperlipidemia   . Hypertension   . Nephrolithiasis   . Diverticulosis   . H. pylori infection 05/08/2010  . Hiatal hernia 05/08/2010    EGD  . Gastritis 05/08/2010    EGD  . Smoker   . Dementia 10/19/2012     Home Meds: See attached medication section for current medication list. Any medications entered into computer today will not appear on this note's list. The medications listed below were entered prior to today. Current Outpatient Prescriptions on File Prior to Visit  Medication Sig Dispense Refill  . albuterol (VENTOLIN HFA) 108 (90 BASE) MCG/ACT inhaler Inhale 2 puffs into the lungs every 6 (six) hours as needed.        Marland Kitchen amLODipine (NORVASC) 10 MG tablet Take 1 tablet (10 mg total) by mouth daily.  90 tablet  3  . aspirin 81 MG tablet  Take 81 mg by mouth daily.        Marland Kitchen atorvastatin (LIPITOR) 40 MG tablet Take 1 tablet (40 mg total) by mouth daily.  90 tablet  0  . cyanocobalamin (,VITAMIN B-12,) 1000 MCG/ML injection Inject 1,000 mcg into the muscle every 30 (thirty) days.        . enalapril (VASOTEC) 20 MG tablet Take 1 tablet (20 mg total) by mouth 2 (two) times daily.  180 tablet  3  . folic acid (FOLVITE) 1 MG tablet Take 1 mg by mouth daily.        Marland Kitchen glipiZIDE (GLUCOTROL XL) 10 MG 24 hr tablet Take 1 tablet (10 mg total) by mouth daily.  90 tablet  3  . glucose blood test strip 1 each by Other route daily.  100 each  12  . iron polysaccharides (NU-IRON) 150 MG capsule Take 1 capsule (150 mg  total) by mouth 2 (two) times daily.  60 capsule  11  . metFORMIN (GLUCOPHAGE) 1000 MG tablet Take 1 tablet (1,000 mg total) by mouth 2 (two) times daily with a meal.  180 tablet  3  . omeprazole (PRILOSEC) 20 MG capsule Take 1 tablet by mouth twice a day x 10 days, then return to 1 daily  20 capsule  0  . pioglitazone (ACTOS) 45 MG tablet Take 1 tablet (45 mg total) by mouth daily.  90 tablet  3  . valsartan (DIOVAN) 320 MG tablet Take 1 tablet (320 mg total) by mouth daily.  90 tablet  3  . hydrOXYzine (ATARAX) 50 MG tablet Take 50 mg by mouth 3 (three) times daily as needed. itching       . tiotropium (SPIRIVA) 18 MCG inhalation capsule Place 18 mcg into inhaler and inhale daily.         No current facility-administered medications on file prior to visit.    Allergies: No Known Allergies  History   Social History  . Marital Status: Married    Spouse Name: N/A    Number of Children: N/A  . Years of Education: N/A   Occupational History  . Not on file.   Social History Main Topics  . Smoking status: Former Smoker    Types: Cigarettes    Quit date: 11/09/2012  . Smokeless tobacco: Never Used  . Alcohol Use: No  . Drug Use: No  . Sexual Activity: Not on file   Other Topics Concern  . Not on file   Social History Narrative  . No narrative on file    Family History  Problem Relation Age of Onset  . Coronary artery disease Mother      Review of Systems:  See HPI for pertinent ROS. All other ROS negative.    Physical Exam: Blood pressure 140/76, pulse 68, temperature 98.3 F (36.8 C), temperature source Oral, resp. rate 18, weight 199 lb (90.266 kg)., Body mass index is 29.81 kg/(m^2). General: WNWD WM. Appears in no acute distress. Neck: Supple. No thyromegaly. No lymphadenopathy. No carotid bruits. Lungs: Clear bilaterally to auscultation without wheezes, rales, or rhonchi. Breathing is unlabored. Heart: RRR with S1 S2. No murmurs, rubs, or gallops. Abdomen: Soft,  non-tender, non-distended with normoactive bowel sounds. No hepatomegaly. No rebound/guarding. No obvious abdominal masses. Musculoskeletal:  Strength and tone normal for age. Extremities/Skin: Warm and dry. No clubbing or cyanosis. No edema. No rashes or suspicious lesions. Neuro: Alert and oriented X 3. Moves all extremities spontaneously. Gait is normal. CNII-XII grossly in tact. Psych:  Responds to questions appropriately with a normal affect.     ASSESSMENT AND PLAN:  72 y.o. year old male with  1. Dementia We'll start Aricept 5 mg one by mouth each bedtime for 4-6 weeks. If he tolerates this with no adverse effects then they will call us and we can send in a prescription for the 10 mg.  At his next office visit if he is on Aricept 10 mg at bedtime and tolerating that well then we will discuss adding Namenda to this. I have already discussed this with the patient and wife today. I talked with the wife regarding whether she needed any type of support system. She says that the husband's mother had a significant dementia and she has also had her own family members who have had dementia and she is well aware and has proper expectations of what to expect. She is retired. SHe is available to be with him 24 /7.  Discussed concerns of him driving. They plan for him to stop driving altogether. - donepezil (ARICEPT) 5 MG tablet; Take 1 tablet (5 mg total) by mouth at bedtime.  Dispense: 30 tablet; Refill: 1   Status post Mini-Mental exam 10/12/12--total score 19 which was at the bladder and of the range for moderate dementia. Labs including CBC, CMP PT, TSH were normal. CT head was obtained at that time. See report/ HPI  2. Smoker He has completely quit smoking since the last visit.  3. I reviewed his chart. He had a carotid artery ultrasound performed 03/05/2011. No hemodynamically significant  ICA stenosis on either side. Narrowing was less than 50% bilaterally.  4. DM (diabetes mellitus),  secondary, uncontrolled, with peripheral vascular complications - Hemoglobin A1C, fingerstick Wife reports that he is still stuck on going to [employer] and eating biscuits and gravy for breakfast. (They had reported this at the prior visit 10/12/2012)  5. Hyperlipidemia Lipid panel was excellent at recheck 09/09/12 on current medications. As Well, LFTs were normal on current medicines.  6. Hypertension   7. B12 deficiency anemia - cyanocobalamin ((VITAMIN B-12)) injection 1,000 mcg; Inject 1 mL (1,000 mcg total) into the muscle once.  8. CAD, NATIVE VESSEL  9. Anemia, iron deficiency  He has a remote history of a hospitalization at which time he had mixed types of anemia including iron deficiency, folate deficiency, B12 deficiency. I rechecked an anemia panel 10/12/12. He is still on routine B12 injections. His current B12 level was at normal range on his current dose of B12 injection. Folate levels remain normal off of folic acid and he can remain off of this. He has been off of the folic acid for about one year. Recent labs showed iron normal but ferritin was low told him to restart iron.  10. B12 deficiency  11. Emphysema of lung  12. GERD (gastroesophageal reflux disease)  Regular office visit in 3 months or sooner if needed. They can go ahead and call me in one month  to increase the dose of Aricept from 5 mg 10 mg each bedtime.  Signed, [address] [place], [name], [facility] 12/14/2012 2:30 PM

## 2013-01-11 ENCOUNTER — Telehealth: Payer: Self-pay | Admitting: Physician Assistant

## 2013-01-11 DIAGNOSIS — F0390 Unspecified dementia without behavioral disturbance: Secondary | ICD-10-CM

## 2013-01-11 MED ORDER — DONEPEZIL HCL 5 MG PO TABS: 5.0000 mg | ORAL_TABLET | Freq: Every day | ORAL | Status: DC

## 2013-01-11 NOTE — Telephone Encounter (Signed)
Shon HaleMary Beth started the pt Last month on generic Aricept the pt has had no side effects and would like a refill [facility] Call back number is  [phone]

## 2013-01-11 NOTE — Telephone Encounter (Signed)
Medication refilled per protocol. 

## 2013-01-15 ENCOUNTER — Ambulatory Visit (INDEPENDENT_AMBULATORY_CARE_PROVIDER_SITE_OTHER): Payer: Medicare Other | Admitting: Family Medicine

## 2013-01-15 DIAGNOSIS — E538 Deficiency of other specified B group vitamins: Secondary | ICD-10-CM | POA: Diagnosis not present

## 2013-01-15 MED ORDER — CYANOCOBALAMIN 1000 MCG/ML IJ SOLN
1000.0000 ug | Freq: Once | INTRAMUSCULAR | Status: AC
Start: 2013-01-15 — End: 2013-01-15
  Administered 2013-01-15: 1000 ug via INTRAMUSCULAR

## 2013-02-01 DIAGNOSIS — I2510 Atherosclerotic heart disease of native coronary artery without angina pectoris: Secondary | ICD-10-CM | POA: Insufficient documentation

## 2013-02-01 DIAGNOSIS — I798 Other disorders of arteries, arterioles and capillaries in diseases classified elsewhere: Secondary | ICD-10-CM | POA: Diagnosis not present

## 2013-02-01 DIAGNOSIS — E669 Obesity, unspecified: Secondary | ICD-10-CM | POA: Diagnosis not present

## 2013-02-01 DIAGNOSIS — E876 Hypokalemia: Secondary | ICD-10-CM | POA: Insufficient documentation

## 2013-02-01 DIAGNOSIS — Z7982 Long term (current) use of aspirin: Secondary | ICD-10-CM | POA: Insufficient documentation

## 2013-02-01 DIAGNOSIS — I498 Other specified cardiac arrhythmias: Secondary | ICD-10-CM | POA: Insufficient documentation

## 2013-02-01 DIAGNOSIS — S51009A Unspecified open wound of unspecified elbow, initial encounter: Secondary | ICD-10-CM | POA: Diagnosis not present

## 2013-02-01 DIAGNOSIS — Z87891 Personal history of nicotine dependence: Secondary | ICD-10-CM | POA: Insufficient documentation

## 2013-02-01 DIAGNOSIS — Z79899 Other long term (current) drug therapy: Secondary | ICD-10-CM | POA: Insufficient documentation

## 2013-02-01 DIAGNOSIS — J45909 Unspecified asthma, uncomplicated: Secondary | ICD-10-CM | POA: Diagnosis not present

## 2013-02-01 DIAGNOSIS — K219 Gastro-esophageal reflux disease without esophagitis: Secondary | ICD-10-CM | POA: Insufficient documentation

## 2013-02-01 DIAGNOSIS — R0602 Shortness of breath: Secondary | ICD-10-CM | POA: Insufficient documentation

## 2013-02-01 DIAGNOSIS — R079 Chest pain, unspecified: Secondary | ICD-10-CM | POA: Diagnosis not present

## 2013-02-01 DIAGNOSIS — R0789 Other chest pain: Secondary | ICD-10-CM | POA: Diagnosis not present

## 2013-02-01 DIAGNOSIS — I10 Essential (primary) hypertension: Secondary | ICD-10-CM | POA: Insufficient documentation

## 2013-02-01 DIAGNOSIS — E1351 Other specified diabetes mellitus with diabetic peripheral angiopathy without gangrene: Secondary | ICD-10-CM | POA: Insufficient documentation

## 2013-02-01 DIAGNOSIS — F0390 Unspecified dementia without behavioral disturbance: Secondary | ICD-10-CM | POA: Insufficient documentation

## 2013-02-01 DIAGNOSIS — W19XXXA Unspecified fall, initial encounter: Secondary | ICD-10-CM | POA: Insufficient documentation

## 2013-02-01 DIAGNOSIS — J438 Other emphysema: Secondary | ICD-10-CM | POA: Insufficient documentation

## 2013-02-01 DIAGNOSIS — E785 Hyperlipidemia, unspecified: Secondary | ICD-10-CM | POA: Insufficient documentation

## 2013-02-01 NOTE — ED Notes (Addendum)
Pt states that he started having SOB and chest pain x 1 hour ago while lying in bed. Pain has since dissipated. 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 COPD. Emphysema. Breathing not visibly labored.

## 2013-02-02 ENCOUNTER — Encounter (HOSPITAL_COMMUNITY): Payer: Self-pay

## 2013-02-02 ENCOUNTER — Observation Stay (HOSPITAL_COMMUNITY)
Admission: EM | Admit: 2013-02-02 | Discharge: 2013-02-04 | Disposition: A | Discharge status: Home / Self Care | Payer: Medicare Other | Attending: Internal Medicine | Admitting: Internal Medicine

## 2013-02-02 ENCOUNTER — Emergency Department (HOSPITAL_COMMUNITY): Payer: Medicare Other

## 2013-02-02 DIAGNOSIS — K449 Diaphragmatic hernia without obstruction or gangrene: Secondary | ICD-10-CM | POA: Diagnosis present

## 2013-02-02 DIAGNOSIS — E876 Hypokalemia: Secondary | ICD-10-CM | POA: Diagnosis present

## 2013-02-02 DIAGNOSIS — Z87891 Personal history of nicotine dependence: Secondary | ICD-10-CM

## 2013-02-02 DIAGNOSIS — IMO0002 Reserved for concepts with insufficient information to code with codable children: Secondary | ICD-10-CM

## 2013-02-02 DIAGNOSIS — I798 Other disorders of arteries, arterioles and capillaries in diseases classified elsewhere: Secondary | ICD-10-CM

## 2013-02-02 DIAGNOSIS — I2510 Atherosclerotic heart disease of native coronary artery without angina pectoris: Secondary | ICD-10-CM

## 2013-02-02 DIAGNOSIS — E785 Hyperlipidemia, unspecified: Secondary | ICD-10-CM | POA: Diagnosis present

## 2013-02-02 DIAGNOSIS — R0602 Shortness of breath: Secondary | ICD-10-CM | POA: Diagnosis not present

## 2013-02-02 DIAGNOSIS — J45909 Unspecified asthma, uncomplicated: Secondary | ICD-10-CM

## 2013-02-02 DIAGNOSIS — E1365 Other specified diabetes mellitus with hyperglycemia: Secondary | ICD-10-CM

## 2013-02-02 DIAGNOSIS — F17200 Nicotine dependence, unspecified, uncomplicated: Secondary | ICD-10-CM

## 2013-02-02 DIAGNOSIS — F0390 Unspecified dementia without behavioral disturbance: Secondary | ICD-10-CM

## 2013-02-02 DIAGNOSIS — E119 Type 2 diabetes mellitus without complications: Secondary | ICD-10-CM

## 2013-02-02 DIAGNOSIS — J439 Emphysema, unspecified: Secondary | ICD-10-CM | POA: Diagnosis present

## 2013-02-02 DIAGNOSIS — R079 Chest pain, unspecified: Secondary | ICD-10-CM

## 2013-02-02 DIAGNOSIS — I10 Essential (primary) hypertension: Secondary | ICD-10-CM

## 2013-02-02 DIAGNOSIS — J438 Other emphysema: Secondary | ICD-10-CM | POA: Diagnosis not present

## 2013-02-02 DIAGNOSIS — E1351 Other specified diabetes mellitus with diabetic peripheral angiopathy without gangrene: Secondary | ICD-10-CM | POA: Diagnosis not present

## 2013-02-02 LAB — BASIC METABOLIC PANEL
BUN: 17 mg/dL (ref 6–23)
BUN: 17 mg/dL (ref 6–23)
CALCIUM: 9 mg/dL (ref 8.4–10.5)
CHLORIDE: 100 meq/L (ref 96–112)
CO2: 22 mEq/L (ref 19–32)
CO2: 19 mEq/L (ref 19–32)
CREATININE: 1.05 mg/dL (ref 0.50–1.35)
Calcium: 8.4 mg/dL (ref 8.4–10.5)
Chloride: 100 mEq/L (ref 96–112)
Creatinine, Ser: 1.06 mg/dL (ref 0.50–1.35)
GFR calc non Af Amer: 69 mL/min — ABNORMAL LOW (ref >90)
GFR calc non Af Amer: 69 mL/min — ABNORMAL LOW (ref >90)
GFR, EST AFRICAN AMERICAN: 80 mL/min — AB (ref >90)
GFR, EST AFRICAN AMERICAN: 80 mL/min — AB (ref >90)
GLUCOSE: 153 mg/dL — AB (ref 70–99)
Glucose, Bld: 165 mg/dL — ABNORMAL HIGH (ref 70–99)
POTASSIUM: 3 meq/L — AB (ref 3.7–5.3)
POTASSIUM: 3 meq/L — AB (ref 3.7–5.3)
SODIUM: 139 meq/L (ref 137–147)
Sodium: 140 mEq/L (ref 137–147)

## 2013-02-02 LAB — CBC
HEMATOCRIT: 34.8 % — AB (ref 39.0–52.0)
HEMATOCRIT: 39.8 % (ref 39.0–52.0)
HEMOGLOBIN: 11.6 g/dL — AB (ref 13.0–17.0)
HEMOGLOBIN: 13.3 g/dL (ref 13.0–17.0)
MCH: 32.8 pg (ref 26.0–34.0)
MCH: 32.8 pg (ref 26.0–34.0)
MCHC: 33.3 g/dL (ref 30.0–36.0)
MCHC: 33.4 g/dL (ref 30.0–36.0)
MCV: 98.3 fL (ref 78.0–100.0)
MCV: 98.3 fL (ref 78.0–100.0)
Platelets: 324 10*3/uL (ref 150–400)
Platelets: 355 10*3/uL (ref 150–400)
RBC: 3.54 MIL/uL — AB (ref 4.22–5.81)
RBC: 4.05 MIL/uL — ABNORMAL LOW (ref 4.22–5.81)
RDW: 13.3 % (ref 11.5–15.5)
RDW: 13.2 % (ref 11.5–15.5)
WBC: 11.3 10*3/uL — AB (ref 4.0–10.5)
WBC: 13.6 10*3/uL — ABNORMAL HIGH (ref 4.0–10.5)

## 2013-02-02 LAB — TROPONIN I
Troponin I: <0.3 ng/mL (ref <0.30)
Troponin I: <0.3 ng/mL (ref <0.30)

## 2013-02-02 LAB — GLUCOSE, CAPILLARY
GLUCOSE-CAPILLARY: 154 mg/dL — AB (ref 70–99)
GLUCOSE-CAPILLARY: 129 mg/dL — AB (ref 70–99)
GLUCOSE-CAPILLARY: 155 mg/dL — AB (ref 70–99)
Glucose-Capillary: 151 mg/dL — ABNORMAL HIGH (ref 70–99)

## 2013-02-02 LAB — POCT I-STAT TROPONIN I: Troponin i, poc: 0 ng/mL (ref 0.00–0.08)

## 2013-02-02 LAB — PRO B NATRIURETIC PEPTIDE: Pro B Natriuretic peptide (BNP): 91.7 pg/mL (ref 0–125)

## 2013-02-02 MED ORDER — ATORVASTATIN CALCIUM 80 MG PO TABS
80.0000 mg | ORAL_TABLET | Freq: Every day | ORAL | Status: DC
  Administered 2013-02-02 – 2013-02-04 (×3): 80 mg via ORAL
  Filled 2013-02-02 (×3): qty 1

## 2013-02-02 MED ORDER — ALBUTEROL SULFATE (2.5 MG/3ML) 0.083% IN NEBU
5.0000 mg | INHALATION_SOLUTION | Freq: Once | RESPIRATORY_TRACT | Status: AC
  Administered 2013-02-02: 5 mg via RESPIRATORY_TRACT
  Filled 2013-02-02: qty 6

## 2013-02-02 MED ORDER — SODIUM CHLORIDE 0.9 % IJ SOLN
3.0000 mL | Freq: Two times a day (BID) | INTRAMUSCULAR | Status: DC
  Administered 2013-02-02 – 2013-02-04 (×5): 3 mL via INTRAVENOUS

## 2013-02-02 MED ORDER — ALBUTEROL SULFATE (2.5 MG/3ML) 0.083% IN NEBU: 2.5000 mg | INHALATION_SOLUTION | RESPIRATORY_TRACT | Status: DC | PRN

## 2013-02-02 MED ORDER — ALBUTEROL SULFATE HFA 108 (90 BASE) MCG/ACT IN AERS: 2.0000 | INHALATION_SPRAY | Freq: Four times a day (QID) | RESPIRATORY_TRACT | Status: DC | PRN

## 2013-02-02 MED ORDER — ASPIRIN 325 MG PO TABS
325.0000 mg | ORAL_TABLET | Freq: Once | ORAL | Status: AC
  Administered 2013-02-02: 325 mg via ORAL
  Filled 2013-02-02: qty 1

## 2013-02-02 MED ORDER — ENOXAPARIN SODIUM 40 MG/0.4ML ~~LOC~~ SOLN
40.0000 mg | SUBCUTANEOUS | Status: DC
  Administered 2013-02-02 – 2013-02-04 (×3): 40 mg via SUBCUTANEOUS
  Filled 2013-02-02 (×3): qty 0.4

## 2013-02-02 MED ORDER — SODIUM CHLORIDE 0.9 % IJ SOLN
3.0000 mL | Freq: Two times a day (BID) | INTRAMUSCULAR | Status: DC
  Administered 2013-02-02 – 2013-02-03 (×2): 3 mL via INTRAVENOUS

## 2013-02-02 MED ORDER — AMLODIPINE BESYLATE 10 MG PO TABS
10.0000 mg | ORAL_TABLET | Freq: Every day | ORAL | Status: DC
  Administered 2013-02-02 – 2013-02-04 (×3): 10 mg via ORAL
  Filled 2013-02-02 (×4): qty 1

## 2013-02-02 MED ORDER — ACETAMINOPHEN 650 MG RE SUPP: 650.0000 mg | Freq: Four times a day (QID) | RECTAL | Status: DC | PRN

## 2013-02-02 MED ORDER — POLYSACCHARIDE IRON COMPLEX 150 MG PO CAPS
150.0000 mg | ORAL_CAPSULE | Freq: Two times a day (BID) | ORAL | Status: DC
  Administered 2013-02-02 – 2013-02-04 (×5): 150 mg via ORAL
  Filled 2013-02-02 (×7): qty 1

## 2013-02-02 MED ORDER — DONEPEZIL HCL 10 MG PO TABS
10.0000 mg | ORAL_TABLET | Freq: Every day | ORAL | Status: DC
  Administered 2013-02-02 – 2013-02-03 (×2): 10 mg via ORAL
  Filled 2013-02-02 (×5): qty 1

## 2013-02-02 MED ORDER — INSULIN ASPART 100 UNIT/ML ~~LOC~~ SOLN
0.0000 [IU] | Freq: Three times a day (TID) | SUBCUTANEOUS | Status: DC
  Administered 2013-02-02 – 2013-02-03 (×2): 1 [IU] via SUBCUTANEOUS
  Administered 2013-02-04: 2 [IU] via SUBCUTANEOUS
  Administered 2013-02-04: 1 [IU] via SUBCUTANEOUS

## 2013-02-02 MED ORDER — IPRATROPIUM-ALBUTEROL 0.5-2.5 (3) MG/3ML IN SOLN
3.0000 mL | Freq: Four times a day (QID) | RESPIRATORY_TRACT | Status: DC
  Administered 2013-02-02 (×3): 3 mL via RESPIRATORY_TRACT
  Filled 2013-02-02 (×3): qty 3

## 2013-02-02 MED ORDER — ALBUTEROL SULFATE (2.5 MG/3ML) 0.083% IN NEBU: 2.5000 mg | INHALATION_SOLUTION | Freq: Four times a day (QID) | RESPIRATORY_TRACT | Status: DC

## 2013-02-02 MED ORDER — IPRATROPIUM BROMIDE 0.02 % IN SOLN: 0.5000 mg | Freq: Four times a day (QID) | RESPIRATORY_TRACT | Status: DC

## 2013-02-02 MED ORDER — ONDANSETRON HCL 4 MG PO TABS: 4.0000 mg | ORAL_TABLET | Freq: Four times a day (QID) | ORAL | Status: DC | PRN

## 2013-02-02 MED ORDER — ENALAPRIL MALEATE 20 MG PO TABS
20.0000 mg | ORAL_TABLET | Freq: Two times a day (BID) | ORAL | Status: DC
  Administered 2013-02-02 – 2013-02-04 (×5): 20 mg via ORAL
  Filled 2013-02-02 (×7): qty 1

## 2013-02-02 MED ORDER — MORPHINE SULFATE 2 MG/ML IJ SOLN: 1.0000 mg | INTRAMUSCULAR | Status: DC | PRN

## 2013-02-02 MED ORDER — PIOGLITAZONE HCL 45 MG PO TABS
45.0000 mg | ORAL_TABLET | Freq: Every day | ORAL | Status: DC
  Administered 2013-02-02: 45 mg via ORAL
  Filled 2013-02-02: qty 1

## 2013-02-02 MED ORDER — WHITE PETROLATUM GEL
Status: DC | PRN
  Filled 2013-02-02 (×2): qty 5

## 2013-02-02 MED ORDER — ACETAMINOPHEN 325 MG PO TABS: 650.0000 mg | ORAL_TABLET | Freq: Four times a day (QID) | ORAL | Status: DC | PRN

## 2013-02-02 MED ORDER — ONDANSETRON HCL 4 MG/2ML IJ SOLN: 4.0000 mg | Freq: Four times a day (QID) | INTRAMUSCULAR | Status: DC | PRN

## 2013-02-02 MED ORDER — IOHEXOL 350 MG/ML SOLN
100.0000 mL | Freq: Once | INTRAVENOUS | Status: AC | PRN
  Administered 2013-02-02: 100 mL via INTRAVENOUS

## 2013-02-02 MED ORDER — IRBESARTAN 300 MG PO TABS
300.0000 mg | ORAL_TABLET | Freq: Every day | ORAL | Status: DC
  Administered 2013-02-02 – 2013-02-04 (×3): 300 mg via ORAL
  Filled 2013-02-02 (×3): qty 1

## 2013-02-02 MED ORDER — ASPIRIN EC 325 MG PO TBEC
325.0000 mg | DELAYED_RELEASE_TABLET | Freq: Every day | ORAL | Status: DC
  Administered 2013-02-02 – 2013-02-04 (×3): 325 mg via ORAL
  Filled 2013-02-02 (×3): qty 1

## 2013-02-02 MED ORDER — CYANOCOBALAMIN 1000 MCG/ML IJ SOLN: 1000.0000 ug | INTRAMUSCULAR | Status: DC

## 2013-02-02 NOTE — H&P (Signed)
[facility] History and Physical  RORY XIANG ZOX:[id number] DOB: 1940/10/21 DOA: 02/02/2013  Referring physician: ER physician. PCP: [name], PA-C  Specialists: [facility].  Chief Complaint: Chest pain and shortness of breath.  HPI: Jonathan [name] is a 73 y.o. male history of CAD and failed PTCA in 2001, diabetes mellitus, hypertension, COPD, tobacco abuse, hyperlipidemia started experience chest pain shortness of breath last night. Patient was on his bed when he started experiencing chest pain which was retrosternal radiating to back. It was pressure-like associated with shortness of breath. Patient states that since morning he's been having shortness of breath and has been using his albuterol inhaler more than usual. Patient denies any nausea vomiting abdominal pain palpitations dizziness diarrhea. In the ER EKG shows sinus tachycardia with ST depressions in the inferior and anterior leads. Patient chest pain has resolved by 5 minutes spontaneously. Presently is not in acute distress. Cardiac markers have been negative. Since patient was short of breath ER physician had ordered CT abdomen chest the results of which are pending. Patient has been admitted for further management.   Review of Systems: As presented in the history of presenting illness, rest negative.  Past Medical History  Diagnosis Date  . ED (erectile dysfunction)   . Vitamin D deficiency   . Pancreatitis   . CAD (coronary artery disease)     cath 2001- one vessel occluded (circ); others patent.  EF 55%  . B12 deficiency 06/02/10    174  . Obesity   . Chronic bronchitis   . Emphysema of lung   . Blood transfusion   . Asthma   . Anemia, iron deficiency 06/02/10    heme negative, ferritin 5  . Diabetes mellitus   . GERD (gastroesophageal reflux disease)   . Hyperlipidemia   . Hypertension   . Nephrolithiasis   . Diverticulosis   . H. pylori infection 05/08/2010  . Hiatal hernia 05/08/2010     EGD  . Gastritis 05/08/2010    EGD  . Smoker   . Dementia 10/19/2012   Past Surgical History  Procedure Laterality Date  . Knee cartilage surgery    . Cardiac catheterization  01/19/99    showed blockage on"back side"   no stent  . Colonoscopy  2005, 06/07/10    mild diverticulosis  . Upper gastrointestinal endoscopy  06/07/10    2 cm hiatal hernia, mild gastritis   Social History:  reports that he quit smoking about 2 months ago. His smoking use included Cigarettes. He smoked 0.00 packs per day. He has never used smokeless tobacco. He reports that he does not drink alcohol or use illicit drugs. Where does patient live home. Can patient participate in ADLs? Yes.  No Known Allergies  Family History:  Family History  Problem Relation Age of Onset  . Coronary artery disease Mother       Prior to Admission medications   Medication Sig Start Date End Date Taking? Authorizing Provider  albuterol (VENTOLIN HFA) 108 (90 BASE) MCG/ACT inhaler Inhale 2 puffs into the lungs every 6 (six) hours as needed.     Yes Historical Provider, MD  amLODipine (NORVASC) 10 MG tablet Take 1 tablet (10 mg total) by mouth daily. 05/20/12  Yes Dorena Bodo, PA-C  aspirin EC 81 MG tablet Take 81 mg by mouth daily.   Yes Historical Provider, MD  atorvastatin (LIPITOR) 80 MG tablet Take 80 mg by mouth daily.   Yes Historical Provider, MD  donepezil (ARICEPT)  10 MG tablet Take 10 mg by mouth at bedtime.   Yes Historical Provider, MD  enalapril (VASOTEC) 20 MG tablet Take 1 tablet (20 mg total) by mouth 2 (two) times daily. 05/20/12  Yes Dorena BodoMary B Dixon, PA-C  iron polysaccharides (NU-IRON) 150 MG capsule Take 1 capsule (150 mg total) by mouth 2 (two) times daily. 10/19/12  Yes Dorena BodoMary B Dixon, PA-C  metFORMIN (GLUCOPHAGE) 1000 MG tablet Take 1 tablet (1,000 mg total) by mouth 2 (two) times daily with a meal. 05/20/12  Yes Dorena BodoMary B Dixon, PA-C  pioglitazone (ACTOS) 45 MG tablet Take 1 tablet (45 mg total) by mouth daily.  05/20/12  Yes Mary B Dixon, PA-C  valsartan (DIOVAN) 320 MG tablet Take 1 tablet (320 mg total) by mouth daily. 05/20/12  Yes Mary B Dixon, PA-C  cyanocobalamin (,VITAMIN B-12,) 1000 MCG/ML injection Inject 1,000 mcg into the muscle every 30 (thirty) days.      Historical Provider, MD    Physical Exam: Filed Vitals:   02/02/13 0145 02/02/13 0344 02/02/13 0400 02/02/13 0430  BP:  134/52 118/48 132/51  Pulse: 100 96 95 98  Temp:  98 F (36.7 C)    TempSrc:  Oral    Resp: 23 20 28 24   SpO2: 94% 94% 91% 92%     General:  Well-developed and nourished.  Eyes: Anicteric no pallor.  ENT: No discharge from the ears eyes nose mouth.  Neck: No mass felt.  Cardiovascular: S1-S2 heard.  Respiratory: No rhonchi or crepitations.  Abdomen: Soft nontender bowel sounds present.  Skin: No rash.  Musculoskeletal: No edema.  Psychiatric: Appears normal.  Neurologic: Alert awake oriented to time place and person. Moves all extremities.  Labs on Admission:  Basic Metabolic Panel:  Recent Labs Lab 02/02/13 0013  NA 140  K 3.0*  CL 100  CO2 22  GLUCOSE 153*  BUN 17  CREATININE 1.05  CALCIUM 9.0   Liver Function Tests: No results found for this basename: AST, ALT, ALKPHOS, BILITOT, PROT, ALBUMIN,  in the last 168 hours No results found for this basename: LIPASE, AMYLASE,  in the last 168 hours No results found for this basename: AMMONIA,  in the last 168 hours CBC:  Recent Labs Lab 02/02/13 0013  WBC 13.6*  HGB 13.3  HCT 39.8  MCV 98.3  PLT 355   Cardiac Enzymes: No results found for this basename: CKTOTAL, CKMB, CKMBINDEX, TROPONINI,  in the last 168 hours  BNP (last 3 results)  Recent Labs  02/02/13 0013  PROBNP 91.7   CBG: No results found for this basename: GLUCAP,  in the last 168 hours  Radiological Exams on Admission: Dg Chest 2 View  02/02/2013   CLINICAL DATA:  Asthma, shortness of breath.  EXAM: CHEST  2 VIEW  COMPARISON:  10/13/2012  FINDINGS: Mild  aortic tortuosity. Heart size upper normal to mildly enlarged. Mild perihilar left greater than right interstitial prominence. Increased AP diameter on the lateral view. No confluent airspace opacity, pleural effusion, or pneumothorax. Mild multilevel degenerative change.  IMPRESSION: Mild interstitial prominence, favored to at least in part reflect chronic changes/ COPD. Superimposed atypical/viral infection or interstitial edema not excluded in the appropriate clinical setting.   Electronically Signed   By: Jearld LeschAndrew  DelGaizo M.D.   On: 02/02/2013 02:13    EKG: Independently reviewed. Sinus tachycardia with ST depression in the inferior and anterior leads.  Assessment/Plan Principal Problem:   Chest pain Active Problems:   CAD, NATIVE VESSEL  Emphysema of lung   DM (diabetes mellitus), secondary, uncontrolled, with peripheral vascular complications   Hyperlipidemia   Hypertension   SOB (shortness of breath)   1. Chest pain with history of CAD - patient has multiple risk factors for CAD and has had previous history of failed attempt for PTCA. Presently patient is chest pain-free. Given his multiple risk factors we'll cycle cardiac markers. Aspirin. Nitroglycerin as needed. I have patient n.p.o. for now in anticipation of possible cardiac procedures. Consult cardiology in a.m. given the multiple risk factors. 2. Shortness of breath - probably related to COPD but patient is not actively wheezing. Patient has had CT angiogram of the chest results of which are pending. I have placed patient on scheduled nebulizer doses along with when necessary nebulizer. 3. Diabetes mellitus type 2 - hold metformin as patient has received contrast IV dye. Continue other medications and I have placed patient on sliding-scale coverage. 4. Hypertension - continue home medications. 5. Hyperlipidemia - continue statins. 6. History of tobacco abuse - quit a month ago.  I have reviewed patient's charts and  labs.  Code Status: Full code.  Family Communication: Patient's wife at the bedside.  Disposition Plan: Admit for observation.    Daved Mcfann N. [facility] Pager [phone].  If 7PM-7AM, please contact night-coverage [email or web address] Password [facility] 02/02/2013, 4:59 AM

## 2013-02-02 NOTE — ED Notes (Signed)
MD contacted through Amion regarding NPO status.

## 2013-02-02 NOTE — Progress Notes (Signed)
Shift event: Around 2105, RN paged this NP secondary to pt having an un witnessed fall. Bed alarm was on and bed was in lowest position with siderails up per protocol. RN says NT found pt leaning against wall by the entrance of the room. Obvious right elbow injury with skin tear. ? Other injuries. RN says (and is documented in chart by PCP) that pt has a recent hx of dx of dementia and had been started on Aricept by PCP. Wife also told RN he had fall at home recently as well. NP to unit. S: pt is vague on event. Says he "just wanted to get up". He denies pain or having to urinate prior to getting OOB. He can't answer specific questions about the fall other than he hit his right elbow and says he hit his head on the right side (but denied this to the RN immediately after fall). No HA, chest pain, nausea, SOB or dizziness. O: Well appearing elderly WM sitting in recliner at nsg station. He is alert. Speech is clear, fluent. VS reviewed as stable. Sugars stable. RRR. Normal resp rate no SOB noted. The right elbow has a 1/2 cm skin tear. No active bleeding. PERRL. Follows commands. Strength normal. Right temporo parietal area palpated and he relates soreness there. No bruising, hematoma, bony deformity noted.   73 yo WM admitted early am with CP and SOB. Troponins neg x 3. CT chest neg for PE. Sugars stable. No sx of COPD exacerbation. Hx dementia per PCP notes which were reviewed by this NP.  A/P: 1. unwitnessed fall with right elbow skin tear. No laceration. No need for treatment other than telfa bandage and ointment qs. There are no focal neuro deficits noted and no change in neuro status since fall per RN. He does have a documented hx of dementia on chart (MMSE 19 in December 14). I believe that is why he can not relate specific details of the fall and not because there is a pathophysiological neuro deficit. Will do neuro checks tonight. Should pt have any n/v, neuro changes, dizziness, somulence or other  changes, will get CT head. CT head was negative on admission early this am. Continue fall precautions. 2. Hypokalemia-noted K 3.0, unknown if supplemented. Recheck at 0500.  3. Chest pain-resolved, workup neg so far.  Wife is aware of what happened per RN.  Jimmye NormanKaren Kirby-Graham, NP [facility]

## 2013-02-02 NOTE — Progress Notes (Signed)
02/02/13 1850 02/02/13 1857  What Happened  Was fall witnessed? No --   Was patient injured? Yes --   Patient found on floor --   Found by Staff-comment Jonathan [name](Jonathan [name]) --   Stated prior activity other (comment) ("I was just getting out of bed") --   Follow Up  MD notified Jonathan ReamerKaren Kirby, NP --   Time MD notified 1900 --   Family notified Yes-comment Jonathan [name](Jonathan [name], wife) --   Time family notified 1855 --   Simple treatment Dressing --   Progress note created (see row info) Yes --   Fall Risk Assessment  Risk Factor Category (scoring not indicated) Fall has occurred during this admission (document High fall risk);High fall risk per protocol (document High fall risk) --   Patient's Fall Risk High Fall Risk (>13 points) --   Fall Risk Interventions  Required Bundle Interventions *See Row Information* High fall risk - low, moderate, and high requirements implemented --   Additional Interventions 24 hour supervision/sitter --   Vitals  Temp --  98 F (36.7 C)  Temp src --  Oral  BP --  ! 146/53 mmHg  BP Location --  Right arm  BP Method --  Automatic  Patient Position, if appropriate --  Sitting  Pulse Rate --  82  Pulse Rate Source --  Dinamap  Resp --  20  Oxygen Therapy  SpO2 --  96 %  O2 Device --  None (Room air)  Neurological  Neuro (WDL) X --   Level of Consciousness Alert --   Orientation Level Oriented to person;Oriented to place;Oriented to time;Disoriented to situation --   Integumentary  Integumentary (WDL) X --   Skin Integrity Other (Comment) (skin tear right elbow) --

## 2013-02-02 NOTE — Progress Notes (Signed)
[facility] PROGRESS NOTE  Jonathan [name]:[id number] DOB: 1940/05/06 DOA: 02/02/2013 PCP: [name] BETH, PA-C  Assessment/Plan:  Chest pain with history of CAD  - patient has multiple risk factors for CAD and has had previous history of failed attempt for PTCA. -Current chest pain-free.  -Obtain troponin x3  - Continue  Aspirin. NitroglycerinPRN -NPO  for now in anticipation of possible cardiac procedures. -Obtain echocardiogram in the a.m. - Consult cardiology if troponin positive.   SOB;  -SOB probably related to COPD but patient is not actively wheezing. -CT angiogram of the chest ; negative PE  -Continue scheduled nebulizer doses -Obtain ambulatory SpO2  Diabetes mellitus type 2 - hold metformin as patient has received contrast IV dye. Pioglitazone as patient is n.p.o. and this could cause hypoglycemia  -Cover with sensitive sliding-scale coverage.  Hypertension - continue home medications.  Hyperlipidemia  - continue statins.  History of tobacco abuse  - quit a month ago. -Had smoked 1 1/2 PPD x50 years   Code Status: Full Family Communication:  Disposition Plan:    Consultants:    Procedures: CT chest PE protocol 02/02/2013 Suboptimal contrast bolus timing and image degradation due to  respiratory motion. Within these limitations, no central pulmonary  embolism. Some of the segmental and subsegmental branches are  incompletely evaluated.  Emphysema, mild.  Mild atelectasis or scarring within the middle lobe and lingula.  Left thyroid lobe nodule be evaluated with a nonemergent thyroid  ultrasound follow-up.   Antibiotics:     HPI/Subjective: Jonathan [name] is a 73 y.o. WM PMHx  hiatal hernia, chronic asthma, CAD and failed PTCA in 2001, DM type 2, hypertension, COPD, tobacco abuse, hyperlipidemia. Presented to ED chest pain shortness of breath last night. Patient was on his bed when he started experiencing chest pain which was  retrosternal radiating to back. It was pressure-like associated with shortness of breath. Patient states that since morning he's been having shortness of breath and has been using his albuterol inhaler more than usual. Patient denies any nausea vomiting abdominal pain palpitations dizziness diarrhea. In the ER EKG shows sinus tachycardia with ST depressions in the inferior and anterior leads. Patient chest pain has resolved by 5 minutes spontaneously. Presently is not in acute distress. Cardiac markers have been negative. Since patient was short of breath ER physician had ordered CT abdomen chest the results of which are pending. Patient has been admitted for further management.    Objective: Filed Vitals:   02/02/13 0513 02/02/13 0530 02/02/13 0700 02/02/13 0917  BP:  146/63 115/41   Pulse:   90   Temp:      TempSrc:      Resp:  23 18   Height: 5\' 9"  (1.753 m)     Weight: 86.637 kg (191 lb)     SpO2:   93% 95%    Intake/Output Summary (Last 24 hours) at 02/02/13 0920 Last data filed at 02/02/13 4401  Gross per 24 hour  Intake      0 ml  Output    200 ml  Net   -200 ml   Filed Weights   02/02/13 0513  Weight: 86.637 kg (191 lb)    Exam:   General:  A./O. x4, NAD  Cardiovascular: Regular rhythm and rate, negative murmurs rubs gallops, DP/PT 1+ bilateral  Respiratory: Clear to ossification bilateral  Abdomen: Soft, nontender, nondistended, plus bowel sounds  Musculoskeletal: Negative pedal edema   Data Reviewed: Basic Metabolic Panel:  Recent Labs Lab  02/02/13 0013 02/02/13 0510  NA 140 139  K 3.0* 3.0*  CL 100 100  CO2 22 19  GLUCOSE 153* 165*  BUN 17 17  CREATININE 1.05 1.06  CALCIUM 9.0 8.4   Liver Function Tests: No results found for this basename: AST, ALT, ALKPHOS, BILITOT, PROT, ALBUMIN,  in the last 168 hours No results found for this basename: LIPASE, AMYLASE,  in the last 168 hours No results found for this basename: AMMONIA,  in the last 168  hours CBC:  Recent Labs Lab 02/02/13 0013 02/02/13 0510  WBC 13.6* 11.3*  HGB 13.3 11.6*  HCT 39.8 34.8*  MCV 98.3 98.3  PLT 355 324   Cardiac Enzymes:  Recent Labs Lab 02/02/13 0510  TROPONINI <0.30   BNP (last 3 results)  Recent Labs  02/02/13 0013  PROBNP 91.7   CBG:  Recent Labs Lab 02/02/13 0810  GLUCAP 154*    No results found for this or any previous visit (from the past 240 hour(s)).   Studies: Dg Chest 2 View  02/02/2013   CLINICAL DATA:  Asthma, shortness of breath.  EXAM: CHEST  2 VIEW  COMPARISON:  10/13/2012  FINDINGS: Mild aortic tortuosity. Heart size upper normal to mildly enlarged. Mild perihilar left greater than right interstitial prominence. Increased AP diameter on the lateral view. No confluent airspace opacity, pleural effusion, or pneumothorax. Mild multilevel degenerative change.  IMPRESSION: Mild interstitial prominence, favored to at least in part reflect chronic changes/ COPD. Superimposed atypical/viral infection or interstitial edema not excluded in the appropriate clinical setting.   Electronically Signed   By: Jearld Lesch M.D.   On: 02/02/2013 02:13   Ct Angio Chest Pe W/cm &/or Wo Cm  02/02/2013   CLINICAL DATA:  Shortness of breath, chest pain, leukocytosis.  EXAM: CT ANGIOGRAPHY CHEST WITH CONTRAST  TECHNIQUE: Multidetector CT imaging of the chest was performed using the standard protocol during bolus administration of intravenous contrast. Multiplanar CT image reconstructions including MIPs were obtained to evaluate the vascular anatomy.  CONTRAST:  OMNIPAQUE IOHEXOL 350 MG/ML SOLN  COMPARISON:  02/02/2013 radiograph  FINDINGS: With contrast having reached the systemic arteries, the bolus timing is suboptimal to evaluate for pulmonary embolism. Furthermore, the images are degraded by respiratory motion. Within these limitations, no central pulmonary arterial filling defect or proximal lobar branch filling defect. Some of the  segmental and subsegmental branches are nondiagnostic.  Scattered atherosclerosis of the aorta and branch vessels without aneurysmal dilatation.  Mild cardiac enlargement. Coronary artery and aortic valvular calcifications. Trace pericardial fluid versus thickening.  No pleural effusion. No intrathoracic lymphadenopathy. Upper abdominal images show nothing acute. There may be a tiny hiatal hernia.  Small amount of mucus/debris along the left wall of the trachea. Otherwise, central airways are patent.  Mild linear opacities within the middle lobe and lingula, favored to reflect areas of atelectasis or scarring. Mild centrilobular emphysema. No pneumothorax.  Mild multilevel degenerative changes. No acute osseous finding. Lipoma within the right posterior musculature. Small nodule left lobe of the thyroid gland.  Review of the MIP images confirms the above findings.  IMPRESSION: Suboptimal contrast bolus timing and image degradation due to respiratory motion. Within these limitations, no central pulmonary embolism. Some of the segmental and subsegmental branches are incompletely evaluated.  Emphysema, mild.  Mild atelectasis or scarring within the middle lobe and lingula.  Left thyroid lobe nodule be evaluated with a nonemergent thyroid ultrasound follow-up.   Electronically Signed   By: Jearld Lesch  M.D.   On: 02/02/2013 03:33    Scheduled Meds: . amLODipine  10 mg Oral Daily  . aspirin EC  325 mg Oral Daily  . atorvastatin  80 mg Oral Daily  . [START ON 02/15/2013] cyanocobalamin  1,000 mcg Intramuscular Q30 days  . donepezil  10 mg Oral QHS  . enalapril  20 mg Oral BID  . enoxaparin (LOVENOX) injection  40 mg Subcutaneous Q24H  . insulin aspart  0-9 Units Subcutaneous TID WC  . ipratropium-albuterol  3 mL Nebulization Q6H  . irbesartan  300 mg Oral Daily  . iron polysaccharides  150 mg Oral BID  . pioglitazone  45 mg Oral Daily  . sodium chloride  3 mL Intravenous Q12H  . sodium chloride  3 mL  Intravenous Q12H   Continuous Infusions:   Principal Problem:   Chest pain Active Problems:   CAD, NATIVE VESSEL   Emphysema of lung   DM (diabetes mellitus), secondary, uncontrolled, with peripheral vascular complications   Hyperlipidemia   Hypertension   SOB (shortness of breath)    Time spent: 40 minute    WOODS, CURTIS, J  [facility] Pager [phone]. If 7PM-7AM, please contact night-coverage at [email or web address], password [email or web address] 02/02/2013, 9:20 AM  LOS: 0 days

## 2013-02-02 NOTE — ED Notes (Signed)
EKG shown to Dr. Joseph ArtWoods.

## 2013-02-02 NOTE — ED Notes (Signed)
Notified Dr. Joseph ArtWoods about patient being NPO but no IV fluid orders.

## 2013-02-02 NOTE — ED Provider Notes (Signed)
CSN: [id number]     Arrival date & time 02/01/13  2302 History   First MD Initiated Contact with Patient 02/02/13 0116     Chief Complaint  Patient presents with  . Shortness of Breath  . Chest Pain   (Consider location/radiation/quality/duration/timing/severity/associated sxs/prior Treatment) Patient is a 73 y.o. male presenting with shortness of breath and chest pain. The history is provided by the patient.  Shortness of Breath Severity:  Moderate Onset quality:  Sudden Timing:  Intermittent Progression:  Waxing and waning Chronicity:  New Context: not pollens and not URI   Relieved by: albuterol. Worsened by:  Nothing tried Associated symptoms: chest pain (central pressure, nonradiating)   Associated symptoms: no abdominal pain, no cough, no fever, no rash and no vomiting   Risk factors: no family hx of DVT, no hx of cancer and no hx of PE/DVT   Chest Pain Associated symptoms: shortness of breath   Associated symptoms: no abdominal pain, no cough, no fever, no nausea and not vomiting     Past Medical History  Diagnosis Date  . ED (erectile dysfunction)   . Vitamin D deficiency   . Pancreatitis   . CAD (coronary artery disease)     cath 2001- one vessel occluded (circ); others patent.  EF 55%  . B12 deficiency 06/02/10    174  . Obesity   . Chronic bronchitis   . Emphysema of lung   . Blood transfusion   . Asthma   . Anemia, iron deficiency 06/02/10    heme negative, ferritin 5  . Diabetes mellitus   . GERD (gastroesophageal reflux disease)   . Hyperlipidemia   . Hypertension   . Nephrolithiasis   . Diverticulosis   . H. pylori infection 05/08/2010  . Hiatal hernia 05/08/2010    EGD  . Gastritis 05/08/2010    EGD  . Smoker   . Dementia 10/19/2012   Past Surgical History  Procedure Laterality Date  . Knee cartilage surgery    . Cardiac catheterization  01/19/99    showed blockage on"back side"   no stent  . Colonoscopy  2005, 06/07/10    mild diverticulosis  .  Upper gastrointestinal endoscopy  06/07/10    2 cm hiatal hernia, mild gastritis   Family History  Problem Relation Age of Onset  . Coronary artery disease Mother    History  Substance Use Topics  . Smoking status: Former Smoker    Types: Cigarettes    Quit date: 11/09/2012  . Smokeless tobacco: Never Used  . Alcohol Use: No    Review of Systems  Constitutional: Negative for fever and chills.  Respiratory: Positive for shortness of breath. Negative for cough.   Cardiovascular: Positive for chest pain (central pressure, nonradiating). Negative for leg swelling.  Gastrointestinal: Negative for nausea, vomiting and abdominal pain.  Skin: Negative for rash.  All other systems reviewed and are negative.    Allergies  Review of patient's allergies indicates no known allergies.  Home Medications   Current Outpatient Rx  Name  Route  Sig  Dispense  Refill  . albuterol (VENTOLIN HFA) 108 (90 BASE) MCG/ACT inhaler   Inhalation   Inhale 2 puffs into the lungs every 6 (six) hours as needed.           Marland Kitchen amLODipine (NORVASC) 10 MG tablet   Oral   Take 1 tablet (10 mg total) by mouth daily.   90 tablet   3   . aspirin EC 81 MG  tablet   Oral   Take 81 mg by mouth daily.         Marland Kitchen. atorvastatin (LIPITOR) 80 MG tablet   Oral   Take 80 mg by mouth daily.         Marland Kitchen. donepezil (ARICEPT) 10 MG tablet   Oral   Take 10 mg by mouth at bedtime.         . enalapril (VASOTEC) 20 MG tablet   Oral   Take 1 tablet (20 mg total) by mouth 2 (two) times daily.   180 tablet   3     Mail order form to [facility]   . iron polysaccharides (NU-IRON) 150 MG capsule   Oral   Take 1 capsule (150 mg total) by mouth 2 (two) times daily.   60 capsule   11   . metFORMIN (GLUCOPHAGE) 1000 MG tablet   Oral   Take 1 tablet (1,000 mg total) by mouth 2 (two) times daily with a meal.   180 tablet   3     Mail order form to [facility]   . pioglitazone (ACTOS) 45 MG tablet   Oral   Take 1  tablet (45 mg total) by mouth daily.   90 tablet   3   . valsartan (DIOVAN) 320 MG tablet   Oral   Take 1 tablet (320 mg total) by mouth daily.   90 tablet   3     Mail order form to [facility]   . cyanocobalamin (,VITAMIN B-12,) 1000 MCG/ML injection   Intramuscular   Inject 1,000 mcg into the muscle every 30 (thirty) days.            BP 158/77  Pulse 104  Temp(Src) 97.8 F (36.6 C) (Oral)  SpO2 95% Physical Exam  Nursing note and vitals reviewed. Constitutional: He is oriented to person, place, and time. He appears well-developed and well-nourished. No distress.  HENT:  Head: Normocephalic and atraumatic.  Mouth/Throat: No oropharyngeal exudate.  Eyes: EOM are normal. Pupils are equal, round, and reactive to light.  Neck: Normal range of motion. Neck supple.  Cardiovascular: Normal rate and regular rhythm.  Exam reveals no friction rub.   No murmur heard. Pulmonary/Chest: Effort normal and breath sounds normal. No respiratory distress. He has no wheezes. He has no rales.  Abdominal: He exhibits no distension. There is no tenderness. There is no rebound.  Musculoskeletal: Normal range of motion. He exhibits no edema.  Neurological: He is alert and oriented to person, place, and time. He exhibits normal muscle tone.  Skin: No rash noted. He is not diaphoretic.    ED Course  Procedures (including critical care time) Labs Review Labs Reviewed  CBC - Abnormal; Notable for the following:    WBC 13.6 (*)    RBC 4.05 (*)    All other components within normal limits  BASIC METABOLIC PANEL - Abnormal; Notable for the following:    Potassium 3.0 (*)    Glucose, Bld 153 (*)    GFR calc non Af Amer 69 (*)    GFR calc Af Amer 80 (*)    All other components within normal limits  PRO B NATRIURETIC PEPTIDE  POCT I-STAT TROPONIN I   Imaging Review No results found.  EKG Interpretation    Date/Time:  Monday February 01 2013 23:10:52 EST Ventricular Rate:  107 PR  Interval:  170 QRS Duration: 70 QT Interval:  328 QTC Calculation: 437 R Axis:  14 Text Interpretation:  Sinus node reentrant tachycardia Mild T wave depression in V2-V5.  Mild T wave depression inferiorly T wave depressions new Confirmed by Gwendolyn Grant  MD, Vint Pola ([id number]) on 02/02/2013 1:18:09 AM            MDM   1. SOB (shortness of breath)   2. Chest pain   3. DM (diabetes mellitus), secondary, uncontrolled, with peripheral vascular complications   4. Hypertension    73 year old male with history of emphysema, coronary artery disease presents with acute onset shortness of breath and central chest pain. Symptoms resolved now. He is resting upon is having. Central chest pain without radiation. No nausea or vomiting. He fell he put his albuterol. No history of PE. Here normotensive, satting 93% on room air. Tachycardic in the lower 100s. Lungs clear, belly benign. EKG shows some T-wave depressions concerning for ischemia. Initial troponin normal. Will CT his chest for possible PE and plan for admission.    Dagmar Hait, MD 02/02/13 [phone]

## 2013-02-02 NOTE — Care Management Note (Addendum)
    Page 1 of 2   02/04/2013     1:37:19 PM   CARE MANAGEMENT NOTE 02/04/2013  Patient:  Jonathan [name],Jonathan [name]   Account Number:  [id number]  Date Initiated:  02/02/2013  Documentation initiated by:  Lanier ClamMAHABIR,Amol Domanski  Subjective/Objective Assessment:   72 Y/O M ADMITTED W/CHEST PAIN.     Action/Plan:   FROM HOME W/SPOUSE.HAS PCP,PHARMACY.   Anticipated DC Date:  02/04/2013   Anticipated DC Plan:  HOME W HOME HEALTH SERVICES      DC Planning Services  CM consult      Choice offered to / List presented to:  C-3 Spouse        HH arranged  HH-1 RN  HH-2 PT  HH-3 OT  HH-4 NURSE'S AIDE  HH-6 SOCIAL WORKER      HH agency  [facility]   Status of service:  Completed, signed off Medicare Important Message given?   (If response is "NO", the following Medicare IM given date fields will be blank) Date Medicare IM given:   Date Additional Medicare IM given:    Discharge Disposition:  HOME W HOME HEALTH SERVICES  Per UR Regulation:  Reviewed for med. necessity/level of care/duration of stay  If discussed at Long Length of Stay Meetings, dates discussed:    Comments:  /29/15 Mylissa Lambe RN,BSN NCM [phone] PT-SNF,PATIENT ONLY QUALIFIES FOR HHC.EXPLAINED MEDICARE GUIDELINES TO SPOUSE.SHE VOICED UNDERSTANDING.CC44 GIVEN.[facility] CHOSEN FOR HHC,[facility] KRISTEN AWARE OF HHC ORDERS,& D/C.PRIVATE SITTER LIST ALSO GIVEN TO SPOUSE AS RESOURCE.SHE VOICED UNDERSTANDING.NO FURTHER D/C NEEDS OR ORDERS.  02/02/13 Vandell Kun RN,BSN NCM [phone]

## 2013-02-03 ENCOUNTER — Observation Stay (HOSPITAL_COMMUNITY): Payer: Medicare Other

## 2013-02-03 DIAGNOSIS — R0989 Other specified symptoms and signs involving the circulatory and respiratory systems: Secondary | ICD-10-CM

## 2013-02-03 DIAGNOSIS — R0609 Other forms of dyspnea: Secondary | ICD-10-CM | POA: Diagnosis not present

## 2013-02-03 DIAGNOSIS — S0990XA Unspecified injury of head, initial encounter: Secondary | ICD-10-CM | POA: Diagnosis not present

## 2013-02-03 DIAGNOSIS — E876 Hypokalemia: Secondary | ICD-10-CM

## 2013-02-03 LAB — CBC WITH DIFFERENTIAL/PLATELET
Basophils Absolute: 0 10*3/uL (ref 0.0–0.1)
Basophils Relative: 1 % (ref 0–1)
Eosinophils Absolute: 0.2 10*3/uL (ref 0.0–0.7)
Eosinophils Relative: 2 % (ref 0–5)
HCT: 38.6 % — ABNORMAL LOW (ref 39.0–52.0)
Hemoglobin: 12.8 g/dL — ABNORMAL LOW (ref 13.0–17.0)
LYMPHS ABS: 1.9 10*3/uL (ref 0.7–4.0)
Lymphocytes Relative: 23 % (ref 12–46)
MCH: 32.7 pg (ref 26.0–34.0)
MCHC: 33.2 g/dL (ref 30.0–36.0)
MCV: 98.5 fL (ref 78.0–100.0)
Monocytes Absolute: 0.8 10*3/uL (ref 0.1–1.0)
Monocytes Relative: 10 % (ref 3–12)
NEUTROS ABS: 5.4 10*3/uL (ref 1.7–7.7)
NEUTROS PCT: 65 % (ref 43–77)
Platelets: 372 10*3/uL (ref 150–400)
RBC: 3.92 MIL/uL — AB (ref 4.22–5.81)
RDW: 13.8 % (ref 11.5–15.5)
WBC: 8.3 10*3/uL (ref 4.0–10.5)

## 2013-02-03 LAB — COMPREHENSIVE METABOLIC PANEL
ALT: 11 U/L (ref 0–53)
AST: 16 U/L (ref 0–37)
Albumin: 3.8 g/dL (ref 3.5–5.2)
Alkaline Phosphatase: 96 U/L (ref 39–117)
BUN: 11 mg/dL (ref 6–23)
CO2: 26 meq/L (ref 19–32)
CREATININE: 0.95 mg/dL (ref 0.50–1.35)
Calcium: 9.4 mg/dL (ref 8.4–10.5)
Chloride: 102 mEq/L (ref 96–112)
GFR, EST NON AFRICAN AMERICAN: 82 mL/min — AB (ref >90)
GLUCOSE: 129 mg/dL — AB (ref 70–99)
Potassium: 3.2 mEq/L — ABNORMAL LOW (ref 3.7–5.3)
Sodium: 143 mEq/L (ref 137–147)
Total Bilirubin: 0.3 mg/dL (ref 0.3–1.2)
Total Protein: 7 g/dL (ref 6.0–8.3)

## 2013-02-03 LAB — MAGNESIUM: MAGNESIUM: 1.6 mg/dL (ref 1.5–2.5)

## 2013-02-03 LAB — POTASSIUM: Potassium: 3.2 mEq/L — ABNORMAL LOW (ref 3.7–5.3)

## 2013-02-03 LAB — GLUCOSE, CAPILLARY
GLUCOSE-CAPILLARY: 129 mg/dL — AB (ref 70–99)
GLUCOSE-CAPILLARY: 121 mg/dL — AB (ref 70–99)
Glucose-Capillary: 100 mg/dL — ABNORMAL HIGH (ref 70–99)

## 2013-02-03 MED ORDER — LORAZEPAM 2 MG/ML IJ SOLN
0.5000 mg | INTRAMUSCULAR | Status: DC | PRN
  Administered 2013-02-04: 0.5 mg via INTRAVENOUS
  Filled 2013-02-03: qty 1

## 2013-02-03 MED ORDER — METHYLPREDNISOLONE SODIUM SUCC 125 MG IJ SOLR
80.0000 mg | Freq: Every evening | INTRAMUSCULAR | Status: DC
Start: 2013-02-03 — End: 2013-02-04
  Administered 2013-02-03: 80 mg via INTRAVENOUS
  Filled 2013-02-03 (×2): qty 1.28

## 2013-02-03 MED ORDER — MAGNESIUM SULFATE 40 MG/ML IJ SOLN
2.0000 g | Freq: Once | INTRAMUSCULAR | Status: AC
  Administered 2013-02-03: 2 g via INTRAVENOUS
  Filled 2013-02-03 (×3): qty 50

## 2013-02-03 MED ORDER — POTASSIUM CHLORIDE 10 MEQ/100ML IV SOLN
10.0000 meq | INTRAVENOUS | Status: DC
  Filled 2013-02-03 (×4): qty 100

## 2013-02-03 MED ORDER — ALBUTEROL SULFATE (2.5 MG/3ML) 0.083% IN NEBU: 2.5000 mg | INHALATION_SOLUTION | Freq: Four times a day (QID) | RESPIRATORY_TRACT | Status: DC | PRN

## 2013-02-03 MED ORDER — POTASSIUM CHLORIDE 20 MEQ/15ML (10%) PO LIQD
40.0000 meq | Freq: Once | ORAL | Status: AC
  Administered 2013-02-03: 40 meq via ORAL
  Filled 2013-02-03: qty 30

## 2013-02-03 MED ORDER — IPRATROPIUM-ALBUTEROL 0.5-2.5 (3) MG/3ML IN SOLN
3.0000 mL | Freq: Two times a day (BID) | RESPIRATORY_TRACT | Status: DC
  Administered 2013-02-03 – 2013-02-04 (×3): 3 mL via RESPIRATORY_TRACT
  Filled 2013-02-03 (×3): qty 3

## 2013-02-03 NOTE — Progress Notes (Signed)
SATURATION QUALIFICATIONS: (This note is used to comply with regulatory documentation for home oxygen)  Patient Saturations on Room Air at Rest = 96%  Patient Saturations on Room Air while Ambulating = 93-96%  Patient Saturations on zero Liters of oxygen while Ambulating = 93-96%  Please briefly explain why patient needs home oxygen:

## 2013-02-03 NOTE — Progress Notes (Addendum)
[facility] PROGRESS NOTE  Jonathan Jonathan [name] P Jonathan [name] ZOX:[id number] DOB: Feb 19, 1940 DOA: 02/02/2013 PCP: Jonathan ButcherIXON,MARY BETH, PA-C  Assessment/Plan:  Chest pain with history of CAD  - patient has multiple risk factors for CAD and has had previous history of failed attempt for PTCA. -Current chest pain-free.  -Obtain troponin x3; negative  - Continue  Aspirin. NitroglycerinPRN -echocardiogram; see results below. - troponin x3 negative.   SOB;  -SOB probably related to COPD but patient is not actively wheezing. -CT angiogram of the chest ; negative PE  -Continue scheduled nebulizer doses -Obtain ambulatory SpO2  Diabetes mellitus type 2 - hold metformin as patient has received contrast IV dye. Pioglitazone as patient is n.p.o. and this could cause hypoglycemia  -Cover with sensitive sliding-scale coverage.  Hypertension - continue home medications.  Hyperlipidemia  - continue statins.  History of tobacco abuse  - quit a month ago. -Had smoked 1 1/2 PPD x50 years  Hypokalemia -Replete and obtain a.m. level   Magnesium -Replete and obtain a.m. level   Code Status: Full Family Communication: Wife and son present for discussion of plan of care Disposition Plan: Discharge in the a.m. after patient sees PT/OT further recommendations   Consultants:    Procedures: CT chest PE protocol 02/02/2013 Suboptimal contrast bolus timing and image degradation due to  respiratory motion. Within these limitations, no central pulmonary  embolism. Some of the segmental and subsegmental branches are  incompletely evaluated.  Emphysema, mild.  Mild atelectasis or scarring within the middle lobe and lingula.  Left thyroid lobe nodule be evaluated with a nonemergent thyroid  ultrasound follow-up.  Echocardiogram 01/26/2013 -Left ventricle: The cavity size was normal. Systolic function was normal. -LVEF;  60% to 65%.  -Although no diagnostic regional wall motion abnormality was identified,  this possibility cannot be completely excluded on the basis of this study.     Antibiotics:     HPI/Subjective: Jonathan Jonathan [name] is a 73 y.o. WM PMHx  hiatal hernia, chronic asthma, CAD and failed PTCA in 2001, DM type 2, hypertension, COPD, tobacco abuse, hyperlipidemia. Presented to ED chest pain shortness of breath last night. Patient was on his bed when he started experiencing chest pain which was retrosternal radiating to back. It was pressure-like associated with shortness of breath. Patient states that since morning he's been having shortness of breath and has been using his albuterol inhaler more than usual. Patient denies any nausea vomiting abdominal pain palpitations dizziness diarrhea. In the ER EKG shows sinus tachycardia with ST depressions in the inferior and anterior leads. Patient chest pain has resolved by 5 minutes spontaneously. Presently is not in acute distress. Cardiac markers have been negative. Since patient was short of breath ER physician had ordered CT abdomen chest the results of which are pending. Patient has been admitted for further management. 1/28 patient feeling much improved, although fail overnight when he attempted to ambulate without help. Patient obtained head CT results pending, however clinically improved.     Objective: Filed Vitals:   02/02/13 2102 02/03/13 0530 02/03/13 0820 02/03/13 1415  BP: 143/52 147/57  152/60  Pulse: 86 69  81  Temp: 98.4 F (36.9 C) 97.9 F (36.6 C)  98.5 F (36.9 C)  TempSrc: Oral Oral  Oral  Resp: 24 20  20   Height:      Weight:  86.2 kg (190 lb 0.6 oz)    SpO2: 93% 94% 95% 93%    Intake/Output Summary (Last 24 hours) at 02/03/13 1622 Last data filed at  02/03/13 1441  Gross per 24 hour  Intake    360 ml  Output   1375 ml  Net  -1015 ml   Filed Weights   02/02/13 0513 02/02/13 1550 02/03/13 0530  Weight: 86.637 kg (191 lb) 86.9 kg (191 lb 9.3 oz) 86.2 kg (190 lb 0.6 oz)    Exam:   General:  A./O. x4,  NAD  Cardiovascular: Regular rhythm and rate, negative murmurs rubs gallops, DP/PT 1+ bilateral  Respiratory: Clear to ossification bilateral  Abdomen: Soft, nontender, nondistended, plus bowel sounds  Musculoskeletal: Negative pedal edema   Data Reviewed: Basic Metabolic Panel:  Recent Labs Lab 02/02/13 0013 02/02/13 0510 02/03/13 0630  NA 140 139 143  K 3.0* 3.0* 3.2*  3.2*  CL 100 100 102  CO2 22 19 26   GLUCOSE 153* 165* 129*  BUN 17 17 11   CREATININE 1.05 1.06 0.95  CALCIUM 9.0 8.4 9.4  MG  --   --  1.6   Liver Function Tests:  Recent Labs Lab 02/03/13 0630  AST 16  ALT 11  ALKPHOS 96  BILITOT 0.3  PROT 7.0  ALBUMIN 3.8   No results found for this basename: LIPASE, AMYLASE,  in the last 168 hours No results found for this basename: AMMONIA,  in the last 168 hours CBC:  Recent Labs Lab 02/02/13 0013 02/02/13 0510 02/03/13 0630  WBC 13.6* 11.3* 8.3  NEUTROABS  --   --  5.4  HGB 13.3 11.6* 12.8*  HCT 39.8 34.8* 38.6*  MCV 98.3 98.3 98.5  PLT 355 324 372   Cardiac Enzymes:  Recent Labs Lab 02/02/13 0510 02/02/13 1205 02/02/13 1719  TROPONINI <0.30 <0.30 <0.30   BNP (last 3 results)  Recent Labs  02/02/13 0013  PROBNP 91.7   CBG:  Recent Labs Lab 02/02/13 1227 02/02/13 1641 02/02/13 2059 02/03/13 0718 02/03/13 1101  GLUCAP 151* 129* 155* 129* 121*    No results found for this or any previous visit (from the past 240 hour(s)).   Studies: Dg Chest 2 View  02/02/2013   CLINICAL DATA:  Asthma, shortness of breath.  EXAM: CHEST  2 VIEW  COMPARISON:  10/13/2012  FINDINGS: Mild aortic tortuosity. Heart size upper normal to mildly enlarged. Mild perihilar left greater than right interstitial prominence. Increased AP diameter on the lateral view. No confluent airspace opacity, pleural effusion, or pneumothorax. Mild multilevel degenerative change.  IMPRESSION: Mild interstitial prominence, favored to at least in part reflect chronic  changes/ COPD. Superimposed atypical/viral infection or interstitial edema not excluded in the appropriate clinical setting.   Electronically Signed   By: Jearld Lesch M.D.   On: 02/02/2013 02:13   Ct Angio Chest Pe W/cm &/or Wo Cm  02/02/2013   CLINICAL DATA:  Shortness of breath, chest pain, leukocytosis.  EXAM: CT ANGIOGRAPHY CHEST WITH CONTRAST  TECHNIQUE: Multidetector CT imaging of the chest was performed using the standard protocol during bolus administration of intravenous contrast. Multiplanar CT image reconstructions including MIPs were obtained to evaluate the vascular anatomy.  CONTRAST:  OMNIPAQUE IOHEXOL 350 MG/ML SOLN  COMPARISON:  02/02/2013 radiograph  FINDINGS: With contrast having reached the systemic arteries, the bolus timing is suboptimal to evaluate for pulmonary embolism. Furthermore, the images are degraded by respiratory motion. Within these limitations, no central pulmonary arterial filling defect or proximal lobar branch filling defect. Some of the segmental and subsegmental branches are nondiagnostic.  Scattered atherosclerosis of the aorta and branch vessels without aneurysmal dilatation.  Mild cardiac  enlargement. Coronary artery and aortic valvular calcifications. Trace pericardial fluid versus thickening.  No pleural effusion. No intrathoracic lymphadenopathy. Upper abdominal images show nothing acute. There may be a tiny hiatal hernia.  Small amount of mucus/debris along the left wall of the trachea. Otherwise, central airways are patent.  Mild linear opacities within the middle lobe and lingula, favored to reflect areas of atelectasis or scarring. Mild centrilobular emphysema. No pneumothorax.  Mild multilevel degenerative changes. No acute osseous finding. Lipoma within the right posterior musculature. Small nodule left lobe of the thyroid gland.  Review of the MIP images confirms the above findings.  IMPRESSION: Suboptimal contrast bolus timing and image degradation  due to respiratory motion. Within these limitations, no central pulmonary embolism. Some of the segmental and subsegmental branches are incompletely evaluated.  Emphysema, mild.  Mild atelectasis or scarring within the middle lobe and lingula.  Left thyroid lobe nodule be evaluated with a nonemergent thyroid ultrasound follow-up.   Electronically Signed   By: Jearld Lesch M.D.   On: 02/02/2013 03:33    Scheduled Meds: . amLODipine  10 mg Oral Daily  . aspirin EC  325 mg Oral Daily  . atorvastatin  80 mg Oral Daily  . [START ON 02/15/2013] cyanocobalamin  1,000 mcg Intramuscular Q30 days  . donepezil  10 mg Oral QHS  . enalapril  20 mg Oral BID  . enoxaparin (LOVENOX) injection  40 mg Subcutaneous Q24H  . insulin aspart  0-9 Units Subcutaneous TID WC  . ipratropium-albuterol  3 mL Nebulization BID  . irbesartan  300 mg Oral Daily  . iron polysaccharides  150 mg Oral BID  . magnesium sulfate 1 - 4 g bolus IVPB  2 g Intravenous Once  . potassium chloride  10 mEq Intravenous Q1 Hr x 4  . sodium chloride  3 mL Intravenous Q12H  . sodium chloride  3 mL Intravenous Q12H   Continuous Infusions:   Principal Problem:   Chest pain Active Problems:   CAD, NATIVE VESSEL   Emphysema of lung   Asthma, chronic   DM (diabetes mellitus), secondary, uncontrolled, with peripheral vascular complications   Hyperlipidemia   Hypertension   Hiatal hernia   Smoker   SOB (shortness of breath)   Hypokalemia   Hypomagnesemia    Time spent: 40 minute    WOODS, CURTIS, J  [facility] Pager [phone]. If 7PM-7AM, please contact night-coverage at [email or web address], password [email or web address] 02/03/2013, 4:22 PM  LOS: 1 day

## 2013-02-03 NOTE — Progress Notes (Signed)
Echocardiogram 2D Echocardiogram has been performed.  Jonathan [name] 02/03/2013, 9:07 AM

## 2013-02-03 NOTE — Progress Notes (Signed)
Patient had six beats of v-tach.  Patient asymptomatic.  Nurse in room with patient attempting to restart patient's IV when episode occurred.  Dr. Joseph ArtWoods notified via text page.  Will continue to monitor patient.

## 2013-02-03 NOTE — Progress Notes (Signed)
UR completed 

## 2013-02-04 DIAGNOSIS — F0390 Unspecified dementia without behavioral disturbance: Secondary | ICD-10-CM

## 2013-02-04 LAB — COMPREHENSIVE METABOLIC PANEL
ALK PHOS: 105 U/L (ref 39–117)
ALT: 12 U/L (ref 0–53)
AST: 16 U/L (ref 0–37)
Albumin: 3.7 g/dL (ref 3.5–5.2)
BILIRUBIN TOTAL: 0.2 mg/dL — AB (ref 0.3–1.2)
BUN: 14 mg/dL (ref 6–23)
CHLORIDE: 100 meq/L (ref 96–112)
CO2: 26 meq/L (ref 19–32)
Calcium: 9 mg/dL (ref 8.4–10.5)
Creatinine, Ser: 1.03 mg/dL (ref 0.50–1.35)
GFR calc non Af Amer: 71 mL/min — ABNORMAL LOW (ref >90)
GFR, EST AFRICAN AMERICAN: 82 mL/min — AB (ref >90)
GLUCOSE: 211 mg/dL — AB (ref 70–99)
POTASSIUM: 4.5 meq/L (ref 3.7–5.3)
Sodium: 139 mEq/L (ref 137–147)
Total Protein: 7.1 g/dL (ref 6.0–8.3)

## 2013-02-04 LAB — CBC WITH DIFFERENTIAL/PLATELET
BASOS PCT: 0 % (ref 0–1)
Basophils Absolute: 0 10*3/uL (ref 0.0–0.1)
Eosinophils Absolute: 0 10*3/uL (ref 0.0–0.7)
Eosinophils Relative: 0 % (ref 0–5)
HEMATOCRIT: 41.4 % (ref 39.0–52.0)
Hemoglobin: 13.9 g/dL (ref 13.0–17.0)
Lymphocytes Relative: 7 % — ABNORMAL LOW (ref 12–46)
Lymphs Abs: 0.9 10*3/uL (ref 0.7–4.0)
MCH: 32.9 pg (ref 26.0–34.0)
MCHC: 33.6 g/dL (ref 30.0–36.0)
MCV: 97.9 fL (ref 78.0–100.0)
MONO ABS: 0.7 10*3/uL (ref 0.1–1.0)
Monocytes Relative: 5 % (ref 3–12)
Neutro Abs: 11.4 10*3/uL — ABNORMAL HIGH (ref 1.7–7.7)
Neutrophils Relative %: 88 % — ABNORMAL HIGH (ref 43–77)
Platelets: 386 10*3/uL (ref 150–400)
RBC: 4.23 MIL/uL (ref 4.22–5.81)
RDW: 13.5 % (ref 11.5–15.5)
WBC: 13 10*3/uL — AB (ref 4.0–10.5)

## 2013-02-04 LAB — RESPIRATORY VIRUS PANEL
ADENOVIRUS: NOT DETECTED
INFLUENZA A H1: NOT DETECTED
Influenza A H3: NOT DETECTED
Influenza A: NOT DETECTED
Influenza B: NOT DETECTED
Metapneumovirus: NOT DETECTED
PARAINFLUENZA 3 A: NOT DETECTED
Parainfluenza 1: NOT DETECTED
Parainfluenza 2: NOT DETECTED
RESPIRATORY SYNCYTIAL VIRUS B: NOT DETECTED
RHINOVIRUS: NOT DETECTED
Respiratory Syncytial Virus A: NOT DETECTED

## 2013-02-04 LAB — URINALYSIS, ROUTINE W REFLEX MICROSCOPIC
Bilirubin Urine: NEGATIVE
Glucose, UA: 500 mg/dL — AB
HGB URINE DIPSTICK: NEGATIVE
Ketones, ur: 15 mg/dL — AB
Leukocytes, UA: NEGATIVE
Nitrite: NEGATIVE
PROTEIN: 30 mg/dL — AB
SPECIFIC GRAVITY, URINE: 1.018 (ref 1.005–1.030)
UROBILINOGEN UA: 0.2 mg/dL (ref 0.0–1.0)
pH: 7.5 (ref 5.0–8.0)

## 2013-02-04 LAB — GLUCOSE, CAPILLARY
GLUCOSE-CAPILLARY: 145 mg/dL — AB (ref 70–99)
GLUCOSE-CAPILLARY: 173 mg/dL — AB (ref 70–99)
Glucose-Capillary: 124 mg/dL — ABNORMAL HIGH (ref 70–99)

## 2013-02-04 LAB — MAGNESIUM: Magnesium: 2 mg/dL (ref 1.5–2.5)

## 2013-02-04 LAB — URINE MICROSCOPIC-ADD ON

## 2013-02-04 LAB — HEMOGLOBIN A1C
HEMOGLOBIN A1C: 6.5 % — AB (ref <5.7)
Mean Plasma Glucose: 140 mg/dL — ABNORMAL HIGH (ref <117)

## 2013-02-04 MED ORDER — LEVOFLOXACIN 750 MG PO TABS: 750.0000 mg | ORAL_TABLET | Freq: Every day | ORAL | Status: DC

## 2013-02-04 MED ORDER — METFORMIN HCL 500 MG PO TABS
1000.0000 mg | ORAL_TABLET | Freq: Two times a day (BID) | ORAL | Status: DC
  Administered 2013-02-04: 1000 mg via ORAL
  Filled 2013-02-04 (×3): qty 2

## 2013-02-04 MED ORDER — PREDNISONE 50 MG PO TABS: ORAL_TABLET | ORAL | Status: DC

## 2013-02-04 MED ORDER — PIOGLITAZONE HCL 45 MG PO TABS
45.0000 mg | ORAL_TABLET | Freq: Every day | ORAL | Status: DC
  Administered 2013-02-04: 45 mg via ORAL
  Filled 2013-02-04: qty 1

## 2013-02-04 NOTE — Evaluation (Signed)
Physical Therapy Evaluation Patient Details Name: Jonathan DemarkDanny P Cuttino MRN: [id number] DOB: 24-Aug-1940 Today's Date: 02/04/2013 Time: [id number] PT Time Calculation (min): 18 min  PT Assessment / Plan / Recommendation History of Present Illness  73 y.o. WM PMHx  hiatal hernia, chronic asthma, CAD and failed PTCA in 2001, DM type 2, hypertension, COPD, tobacco abuse, hyperlipidemia. Presented to ED chest pain shortness of breath last night. Patient was on his bed when he started experiencing chest pain which was retrosternal radiating to back. It was pressure-like associated with shortness of breath. Patient states that since morning he's been having shortness of breath and has been using his albuterol inhaler more than usual. Patient denies any nausea vomiting abdominal pain palpitations dizziness diarrhea. In the ER EKG shows sinus tachycardia with ST depressions in the inferior and anterior leads. Patient chest pain has resolved by 5 minutes spontaneously. Presently is not in acute distress. Cardiac markers have been negative. Since patient was short of breath ER physician had ordered CT abdomen chest the results of which are pending. Patient has been admitted for further management.   Clinical Impression  Pt admitted with above. Pt currently with functional limitations due to the deficits listed below (see PT Problem List). Pt will benefit from skilled PT to increase their independence and safety with mobility to allow discharge to the venue listed below.  Pt with unsteady gait and presents as increased fall risk.  Recommended supervision for mobility and use of RW to spouse, however spouse reports she is not always home with him.  Discussed SNF vs HHPT as well.  Spouse agreeable to f/u PT to improve safety, balance, weakness upon d/c, so if d/c home will need HHPT.     PT Assessment  Patient needs continued PT services    Follow Up Recommendations  SNF    Does the patient have the potential to  tolerate intense rehabilitation      Barriers to Discharge        Equipment Recommendations  None recommended by PT    Recommendations for Other Services     Frequency Min 3X/week    Precautions / Restrictions Precautions Precautions: Fall Precaution Comments: HIGH fall risk, waist restraint, bed alarm Restrictions Weight Bearing Restrictions: No   Pertinent Vitals/Pain No c/o.      Mobility  Bed Mobility Overal bed mobility: Modified Independent Transfers Overall transfer level: Needs assistance Transfers: Sit to/from Stand Sit to Stand: Min guard General transfer comment: verbal cues for safe technique Ambulation/Gait Ambulation/Gait assistance: Min assist Ambulation Distance (Feet): 400 Feet (total) Assistive device: Rolling walker (2 wheeled) General Gait Details: very unsteady with difficulty with any balance challenge (such as start/stop) however improved with RW, recommended to spouse that pt start using RW at home    Exercises     PT Diagnosis: Difficulty walking  PT Problem List: Decreased mobility;Decreased safety awareness;Decreased knowledge of use of DME;Decreased cognition;Decreased strength;Decreased activity tolerance;Decreased balance PT Treatment Interventions: DME instruction;Gait training;Neuromuscular re-education;Balance training;Functional mobility training;Therapeutic activities;Therapeutic exercise;Patient/family education;Stair training     PT Goals(Current goals can be found in the care plan section) Acute Rehab PT Goals PT Goal Formulation: With patient/family Time For Goal Achievement: 02/11/13 Potential to Achieve Goals: Good  Visit Information  Last PT Received On: 02/04/13 Assistance Needed: +1 History of Present Illness: 73 y.o. WM PMHx  hiatal hernia, chronic asthma, CAD and failed PTCA in 2001, DM type 2, hypertension, COPD, tobacco abuse, hyperlipidemia. Presented to ED chest pain shortness of breath last night.  Patient was on  his bed when he started experiencing chest pain which was retrosternal radiating to back. It was pressure-like associated with shortness of breath. Patient states that since morning he's been having shortness of breath and has been using his albuterol inhaler more than usual. Patient denies any nausea vomiting abdominal pain palpitations dizziness diarrhea. In the ER EKG shows sinus tachycardia with ST depressions in the inferior and anterior leads. Patient chest pain has resolved by 5 minutes spontaneously. Presently is not in acute distress. Cardiac markers have been negative. Since patient was short of breath ER physician had ordered CT abdomen chest the results of which are pending. Patient has been admitted for further management.        Prior Functioning  Home Living Family/patient expects to be discharged to:: Private residence Living Arrangements: Spouse/significant other Type of Home: House Home Access: Stairs to enter Secretary/administrator of Steps: 3 Entrance Stairs-Rails: Right Home Layout: Laundry or work area in basement Home Equipment: Environmental consultant - 2 wheels Prior Function Level of Independence: Independent Comments: spouse reports pt with episodes of falls, usually losses balance backwards, also recently started more shuffling of feet Communication Communication: No difficulties    Cognition  Cognition Arousal/Alertness: Awake/alert Behavior During Therapy: Flat affect Overall Cognitive Status: History of cognitive impairments - at baseline    Extremity/Trunk Assessment Lower Extremity Assessment Lower Extremity Assessment: Generalized weakness Cervical / Trunk Assessment Cervical / Trunk Assessment: Normal   Balance Balance Overall balance assessment: Needs assistance High level balance activites: Direction changes;Sudden stops;Turns;Head turns High Level Balance Comments: unsteady with above and requiring min assist  End of Session PT - End of Session Equipment  Utilized During Treatment: Gait belt Activity Tolerance: Patient limited by fatigue Patient left: in bed;with call bell/phone within reach;with bed alarm set;with restraints reapplied;with family/visitor present Nurse Communication: Mobility status  GP Functional Assessment Tool Used: clnical judgement Functional Limitation: Mobility: Walking and moving around Mobility: Walking and Moving Around Current Status ([id number]): At least 20 percent but less than 40 percent impaired, limited or restricted Mobility: Walking and Moving Around Goal Status [phone]): At least 1 percent but less than 20 percent impaired, limited or restricted   Rendell Thivierge,KATHrine E 02/04/2013, 9:56 AM Zenovia Jarred, PT, DPT 02/04/2013 Pager: [phone]

## 2013-02-04 NOTE — Discharge Summary (Signed)
Physician Discharge Summary  [name]:[id number] DOB: 04/27/1940 DOA: 02/02/2013  PCP: [name], PA-C  Admit date: 02/02/2013 Discharge date: 02/04/2013  Time spent: 40 minutes  Recommendations for Outpatient Follow-up:   Chest pain with history of CAD  - patient has multiple risk factors for CAD and has had previous history of failed attempt for PTCA. -Current chest pain-free.  -Obtain troponin x3; negative  - Continue Aspirin. NitroglycerinPRN  -echocardiogram; see results below.  - troponin x3 negative.   SOB;  -SOB probably related to COPD but patient is not actively wheezing.  -CT angiogram of the chest ; negative PE  -Continue scheduled nebulizer doses  -SATURATION QUALIFICATIONS: (This note is used to comply with regulatory documentation for home oxygen)  Patient Saturations on Room Air at Rest = 96%  Patient Saturations on Room Air while Ambulating = 93-96%  Patient Saturations on zero Liters of oxygen while Ambulating = 93-96% Patient does not qualify for home O2  Diabetes mellitus type 2  - Discharge on pioglitazone, and metformin   Hypertension  - continue home medications.   Hyperlipidemia  - continue statins.   Dementia -Continue home medication -Followup with PCP  History of tobacco abuse  - quit a month ago.  -Had smoked 1 1/2 PPD x50 years  -Counseled that restarting smoking would be detrimental to his pulmonary function  Hypokalemia  -Resolved    Magnesium  -Resolved      Discharge Diagnoses:  Principal Problem:   Chest pain Active Problems:   CAD, NATIVE VESSEL   Emphysema of lung   Asthma, chronic   DM (diabetes mellitus), secondary, uncontrolled, with peripheral vascular complications   Hyperlipidemia   Hypertension   Hiatal hernia   Smoker   SOB (shortness of breath)   Hypokalemia   Hypomagnesemia   Discharge Condition: Stable  Diet recommendation: Diabetic   Filed Weights   02/02/13 1550 02/03/13 0530  02/04/13 0515  Weight: 86.9 kg (191 lb 9.3 oz) 86.2 kg (190 lb 0.6 oz) 84.2 kg (185 lb 10 oz)    History of present illness:  Jonathan [name] is a 73 y.o. WM PMHx hiatal hernia, chronic asthma, CAD and failed PTCA in 2001, DM type 2, hypertension, COPD, tobacco abuse, hyperlipidemia. Presented to ED chest pain shortness of breath last night. Patient was on his bed when he started experiencing chest pain which was retrosternal radiating to back. It was pressure-like associated with shortness of breath. Patient states that since morning he's been having shortness of breath and has been using his albuterol inhaler more than usual. Patient denies any nausea vomiting abdominal pain palpitations dizziness diarrhea. In the ER EKG shows sinus tachycardia with ST depressions in the inferior and anterior leads. Patient chest pain has resolved by 5 minutes spontaneously. Presently is not in acute distress. Cardiac markers have been negative. Since patient was short of breath ER physician had ordered CT abdomen chest the results of which are pending. Patient has been admitted for further management. 1/28 patient feeling much improved, although fail overnight when he attempted to ambulate without help. Patient obtained head CT results pending, however clinically improved. 1/29 patient states ready to be discharged no complaints    Consultants:    Procedures:   CT head without contrast 02/03/2013 No acute intracranial abnormality.  Evidence for chronic small vessel ischemic disease  CT chest PE protocol 02/02/2013  Suboptimal contrast bolus timing and image degradation due to  respiratory motion. Within these limitations, no central pulmonary  embolism. Some of the segmental and subsegmental branches are  incompletely evaluated.  Emphysema, mild.  Mild atelectasis or scarring within the middle lobe and lingula.  Left thyroid lobe nodule be evaluated with a nonemergent thyroid  ultrasound follow-up.    Echocardiogram 01/26/2013  -Left ventricle: The cavity size was normal. Systolic function was normal.  -LVEF; 60% to 65%.  -Although no diagnostic regional wall motion abnormality was identified, this possibility cannot be completely excluded on the basis of this study.  Antibiotics: Levofloxacin 1/29>>   Discharge Exam: Filed Vitals:   02/03/13 1415 02/03/13 2107 02/03/13 2256 02/04/13 0515  BP: 152/60 161/61 131/52 139/55  Pulse: 81 80 83 78  Temp: 98.5 F (36.9 C) 98.4 F (36.9 C)  97.7 F (36.5 C)  TempSrc: Oral Tympanic  Oral  Resp: 20 18  16   Height:      Weight:    84.2 kg (185 lb 10 oz)  SpO2: 93% 93%  94%   General: A./O. x4, NAD  Cardiovascular: Regular rhythm and rate, negative murmurs rubs gallops, DP/PT 1+ bilateral  Respiratory: Clear to ossification bilateral  Abdomen: Soft, nontender, nondistended, plus bowel sounds  Musculoskeletal: Negative pedal edema   Discharge Instructions   Future Appointments Provider Department Dept Phone   03/15/2013 9:30 AM Dorena BodoMary B Dixon, PA-C [facility] [phone]       Medication List    ASK your doctor about these medications       amLODipine 10 MG tablet  Commonly known as:  NORVASC  Take 1 tablet (10 mg total) by mouth daily.     aspirin EC 81 MG tablet  Take 81 mg by mouth daily.     atorvastatin 80 MG tablet  Commonly known as:  LIPITOR  Take 80 mg by mouth daily.     cyanocobalamin 1000 MCG/ML injection  Commonly known as:  (VITAMIN B-12)  Inject 1,000 mcg into the muscle every 30 (thirty) days.     donepezil 10 MG tablet  Commonly known as:  ARICEPT  Take 10 mg by mouth at bedtime.     enalapril 20 MG tablet  Commonly known as:  VASOTEC  Take 1 tablet (20 mg total) by mouth 2 (two) times daily.     iron polysaccharides 150 MG capsule  Commonly known as:  NU-IRON  Take 1 capsule (150 mg total) by mouth 2 (two) times daily.     metFORMIN 1000 MG tablet  Commonly known as:   GLUCOPHAGE  Take 1 tablet (1,000 mg total) by mouth 2 (two) times daily with a meal.     pioglitazone 45 MG tablet  Commonly known as:  ACTOS  Take 1 tablet (45 mg total) by mouth daily.     valsartan 320 MG tablet  Commonly known as:  DIOVAN  Take 1 tablet (320 mg total) by mouth daily.     VENTOLIN HFA 108 (90 BASE) MCG/ACT inhaler  Generic drug:  albuterol  Inhale 2 puffs into the lungs every 6 (six) hours as needed.       No Known Allergies    The results of significant diagnostics from this hospitalization (including imaging, microbiology, ancillary and laboratory) are listed below for reference.    Significant Diagnostic Studies: Dg Chest 2 View  02/02/2013   CLINICAL DATA:  Asthma, shortness of breath.  EXAM: CHEST  2 VIEW  COMPARISON:  10/13/2012  FINDINGS: Mild aortic tortuosity. Heart size upper normal to mildly enlarged. Mild perihilar left greater than  right interstitial prominence. Increased AP diameter on the lateral view. No confluent airspace opacity, pleural effusion, or pneumothorax. Mild multilevel degenerative change.  IMPRESSION: Mild interstitial prominence, favored to at least in part reflect chronic changes/ COPD. Superimposed atypical/viral infection or interstitial edema not excluded in the appropriate clinical setting.   Electronically Signed   By: Jearld Lesch M.D.   On: 02/02/2013 02:13   Ct Head Wo Contrast  02/03/2013   CLINICAL DATA:  Fall.  Evaluate for bleed.  EXAM: CT HEAD WITHOUT CONTRAST  TECHNIQUE: Contiguous axial images were obtained from the base of the skull through the vertex without contrast.  COMPARISON:  10/13/2012  FINDINGS: No evidence for acute hemorrhage, mass lesion, midline shift, hydrocephalus or large infarct. Again noted is diffuse low density throughout the subcortical and periventricular white matter. Mild cerebral atrophy is unchanged. The paranasal sinuses are clear. No acute bone abnormality.  IMPRESSION: No acute  intracranial abnormality.  Evidence for chronic small vessel ischemic disease.   Electronically Signed   By: Richarda Overlie M.D.   On: 02/03/2013 16:59   Ct Angio Chest Pe W/cm &/or Wo Cm  02/02/2013   CLINICAL DATA:  Shortness of breath, chest pain, leukocytosis.  EXAM: CT ANGIOGRAPHY CHEST WITH CONTRAST  TECHNIQUE: Multidetector CT imaging of the chest was performed using the standard protocol during bolus administration of intravenous contrast. Multiplanar CT image reconstructions including MIPs were obtained to evaluate the vascular anatomy.  CONTRAST:  OMNIPAQUE IOHEXOL 350 MG/ML SOLN  COMPARISON:  02/02/2013 radiograph  FINDINGS: With contrast having reached the systemic arteries, the bolus timing is suboptimal to evaluate for pulmonary embolism. Furthermore, the images are degraded by respiratory motion. Within these limitations, no central pulmonary arterial filling defect or proximal lobar branch filling defect. Some of the segmental and subsegmental branches are nondiagnostic.  Scattered atherosclerosis of the aorta and branch vessels without aneurysmal dilatation.  Mild cardiac enlargement. Coronary artery and aortic valvular calcifications. Trace pericardial fluid versus thickening.  No pleural effusion. No intrathoracic lymphadenopathy. Upper abdominal images show nothing acute. There may be a tiny hiatal hernia.  Small amount of mucus/debris along the left wall of the trachea. Otherwise, central airways are patent.  Mild linear opacities within the middle lobe and lingula, favored to reflect areas of atelectasis or scarring. Mild centrilobular emphysema. No pneumothorax.  Mild multilevel degenerative changes. No acute osseous finding. Lipoma within the right posterior musculature. Small nodule left lobe of the thyroid gland.  Review of the MIP images confirms the above findings.  IMPRESSION: Suboptimal contrast bolus timing and image degradation due to respiratory motion. Within these limitations,  no central pulmonary embolism. Some of the segmental and subsegmental branches are incompletely evaluated.  Emphysema, mild.  Mild atelectasis or scarring within the middle lobe and lingula.  Left thyroid lobe nodule be evaluated with a nonemergent thyroid ultrasound follow-up.   Electronically Signed   By: Jearld Lesch M.D.   On: 02/02/2013 03:33    Microbiology: No results found for this or any previous visit (from the past 240 hour(s)).   Labs: Basic Metabolic Panel:  Recent Labs Lab 02/02/13 0013 02/02/13 0510 02/03/13 0630  NA 140 139 143  K 3.0* 3.0* 3.2*  3.2*  CL 100 100 102  CO2 22 19 26   GLUCOSE 153* 165* 129*  BUN 17 17 11   CREATININE 1.05 1.06 0.95  CALCIUM 9.0 8.4 9.4  MG  --   --  1.6   Liver Function Tests:  Recent Labs  Lab 02/03/13 0630  AST 16  ALT 11  ALKPHOS 96  BILITOT 0.3  PROT 7.0  ALBUMIN 3.8   No results found for this basename: LIPASE, AMYLASE,  in the last 168 hours No results found for this basename: AMMONIA,  in the last 168 hours CBC:  Recent Labs Lab 02/02/13 0013 02/02/13 0510 02/03/13 0630  WBC 13.6* 11.3* 8.3  NEUTROABS  --   --  5.4  HGB 13.3 11.6* 12.8*  HCT 39.8 34.8* 38.6*  MCV 98.3 98.3 98.5  PLT 355 324 372   Cardiac Enzymes:  Recent Labs Lab 02/02/13 0510 02/02/13 1205 02/02/13 1719  TROPONINI <0.30 <0.30 <0.30   BNP: BNP (last 3 results)  Recent Labs  02/02/13 0013  PROBNP 91.7   CBG:  Recent Labs Lab 02/03/13 0718 02/03/13 1101 02/03/13 1658 02/03/13 2112 02/04/13 0736  GLUCAP 129* 121* 100* 124* 145*       Signed:  Carolyne Littles, MD [facility] [phone] pager

## 2013-02-04 NOTE — Evaluation (Signed)
Occupational Therapy Evaluation Patient Details Name: Jonathan [name] MRN: [id number] DOB: 1940/06/18 Today's Date: 02/04/2013 Time: [id number] OT Time Calculation (min): 23 min  OT Assessment / Plan / Recommendation History of present illness 73 y.o. WM PMHx  hiatal hernia, chronic asthma, CAD and failed PTCA in 2001, DM type 2, hypertension, COPD, tobacco abuse, hyperlipidemia. Presented to ED chest pain shortness of breath last night. Patient was on his bed when he started experiencing chest pain which was retrosternal radiating to back. It was pressure-like associated with shortness of breath. Patient states that since morning he's been having shortness of breath and has been using his albuterol inhaler more than usual. Patient denies any nausea vomiting abdominal pain palpitations dizziness diarrhea. In the ER EKG shows sinus tachycardia with ST depressions in the inferior and anterior leads. Patient chest pain has resolved by 5 minutes spontaneously. Presently is not in acute distress. Cardiac markers have been negative. Since patient was short of breath ER physician had ordered CT abdomen chest the results of which are pending. Patient has been admitted for further management.    Clinical Impression   Pt presents to OT with decreased I with ADL activity due to problems listed below. Pt will benefit from skilled OT to increase I with ADL activity in order toreturn to PLOF   OT Assessment  Patient needs continued OT Services    Follow Up Recommendations  Home health OT       Equipment Recommendations  None recommended by OT       Frequency  Min 2X/week    Precautions / Restrictions Precautions Precautions: Fall Precaution Comments: HIGH fall risk, waist restraint, bed alarm Restrictions Weight Bearing Restrictions: No       ADL  Grooming: Set up Where Assessed - Grooming: Unsupported sitting Upper Body Bathing: Set up Where Assessed - Upper Body Bathing: Unsupported  sitting Lower Body Bathing: Moderate assistance Where Assessed - Lower Body Bathing: Supported sit to stand Upper Body Dressing: Set up Where Assessed - Upper Body Dressing: Supported sit to stand Lower Body Dressing: Moderate assistance Where Assessed - Lower Body Dressing: Supported sit to Pharmacist, hospitalstand Toilet Transfer: Minimal assistance Toilet Transfer Method: Sit to stand    OT Diagnosis: Generalized weakness  OT Problem List: Decreased strength;Decreased activity tolerance OT Treatment Interventions: Self-care/ADL training;Patient/family education;DME and/or AE instruction   OT Goals(Current goals can be found in the care plan section) Acute Rehab OT Goals Patient Stated Goal: get on home OT Goal Formulation: With patient Time For Goal Achievement: 02/04/13 ADL Goals Pt Will Perform Grooming: with set-up;sitting Pt Will Perform Upper Body Dressing: with supervision;with set-up;sitting Pt Will Perform Lower Body Dressing: with supervision;sit to/from stand Pt Will Transfer to Toilet: with supervision;ambulating;regular height toilet Pt Will Perform Toileting - Clothing Manipulation and hygiene: with supervision;sit to/from stand  Visit Information  Last OT Received On: 02/04/13 Assistance Needed: +1 History of Present Illness: 73 y.o. WM PMHx  hiatal hernia, chronic asthma, CAD and failed PTCA in 2001, DM type 2, hypertension, COPD, tobacco abuse, hyperlipidemia. Presented to ED chest pain shortness of breath last night. Patient was on his bed when he started experiencing chest pain which was retrosternal radiating to back. It was pressure-like associated with shortness of breath. Patient states that since morning he's been having shortness of breath and has been using his albuterol inhaler more than usual. Patient denies any nausea vomiting abdominal pain palpitations dizziness diarrhea. In the ER EKG shows sinus tachycardia with ST depressions in the  inferior and anterior leads. Patient  chest pain has resolved by 5 minutes spontaneously. Presently is not in acute distress. Cardiac markers have been negative. Since patient was short of breath ER physician had ordered CT abdomen chest the results of which are pending. Patient has been admitted for further management.        Prior Functioning     Home Living Family/patient expects to be discharged to:: Private residence Living Arrangements: Spouse/significant other Type of Home: House Home Access: Stairs to enter Secretary/administrator of Steps: 3 Entrance Stairs-Rails: Right Home Layout: Laundry or work area in basement Home Equipment: Environmental consultant - 2 wheels Prior Function Level of Independence: Independent Comments: spouse reports pt with episodes of falls, usually losses balance backwards, also recently started more shuffling of feet Communication Communication: No difficulties         Vision/Perception Vision - History Baseline Vision: Wears glasses all the time Patient Visual Report: No change from baseline   Cognition  Cognition Arousal/Alertness: Awake/alert Behavior During Therapy: Flat affect Overall Cognitive Status: History of cognitive impairments - at baseline    Extremity/Trunk Assessment Upper Extremity Assessment Upper Extremity Assessment: Overall WFL for tasks assessed Lower Extremity Assessment Lower Extremity Assessment: Generalized weakness Cervical / Trunk Assessment Cervical / Trunk Assessment: Normal     Mobility Bed Mobility Overal bed mobility: Modified Independent Transfers Overall transfer level: Needs assistance Transfers: Sit to/from Stand Sit to Stand: Min guard General transfer comment: verbal cues for safe technique        Balance Balance Overall balance assessment: Needs assistance High level balance activites: Direction changes;Sudden stops;Turns;Head turns High Level Balance Comments: unsteady with above and requiring min assist   End of Session OT - End of  Session Equipment Utilized During Treatment: Rolling walker Activity Tolerance: Patient tolerated treatment well Patient left: sitting EOB eating lunch ;with call bell/phone within reach;with bed alarm set;with family/visitor present  GO     Zayla Agar, [place] 02/04/2013, 12:01 PM

## 2013-02-05 DIAGNOSIS — J441 Chronic obstructive pulmonary disease with (acute) exacerbation: Secondary | ICD-10-CM | POA: Diagnosis not present

## 2013-02-05 DIAGNOSIS — E669 Obesity, unspecified: Secondary | ICD-10-CM | POA: Diagnosis not present

## 2013-02-05 DIAGNOSIS — I10 Essential (primary) hypertension: Secondary | ICD-10-CM | POA: Diagnosis not present

## 2013-02-05 DIAGNOSIS — E119 Type 2 diabetes mellitus without complications: Secondary | ICD-10-CM | POA: Diagnosis not present

## 2013-02-05 DIAGNOSIS — J45909 Unspecified asthma, uncomplicated: Secondary | ICD-10-CM | POA: Diagnosis not present

## 2013-02-05 DIAGNOSIS — I2510 Atherosclerotic heart disease of native coronary artery without angina pectoris: Secondary | ICD-10-CM | POA: Diagnosis not present

## 2013-02-05 DIAGNOSIS — F0390 Unspecified dementia without behavioral disturbance: Secondary | ICD-10-CM | POA: Diagnosis not present

## 2013-02-05 LAB — URINE CULTURE
Colony Count: NO GROWTH
Culture: NO GROWTH

## 2013-02-08 DIAGNOSIS — J45909 Unspecified asthma, uncomplicated: Secondary | ICD-10-CM | POA: Diagnosis not present

## 2013-02-08 DIAGNOSIS — I10 Essential (primary) hypertension: Secondary | ICD-10-CM | POA: Diagnosis not present

## 2013-02-08 DIAGNOSIS — F0390 Unspecified dementia without behavioral disturbance: Secondary | ICD-10-CM | POA: Diagnosis not present

## 2013-02-08 DIAGNOSIS — J441 Chronic obstructive pulmonary disease with (acute) exacerbation: Secondary | ICD-10-CM | POA: Diagnosis not present

## 2013-02-08 DIAGNOSIS — E119 Type 2 diabetes mellitus without complications: Secondary | ICD-10-CM | POA: Diagnosis not present

## 2013-02-08 DIAGNOSIS — I2510 Atherosclerotic heart disease of native coronary artery without angina pectoris: Secondary | ICD-10-CM | POA: Diagnosis not present

## 2013-02-09 DIAGNOSIS — I2510 Atherosclerotic heart disease of native coronary artery without angina pectoris: Secondary | ICD-10-CM | POA: Diagnosis not present

## 2013-02-09 DIAGNOSIS — I10 Essential (primary) hypertension: Secondary | ICD-10-CM | POA: Diagnosis not present

## 2013-02-09 DIAGNOSIS — J45909 Unspecified asthma, uncomplicated: Secondary | ICD-10-CM | POA: Diagnosis not present

## 2013-02-09 DIAGNOSIS — F0390 Unspecified dementia without behavioral disturbance: Secondary | ICD-10-CM | POA: Diagnosis not present

## 2013-02-09 DIAGNOSIS — E119 Type 2 diabetes mellitus without complications: Secondary | ICD-10-CM | POA: Diagnosis not present

## 2013-02-09 DIAGNOSIS — J441 Chronic obstructive pulmonary disease with (acute) exacerbation: Secondary | ICD-10-CM | POA: Diagnosis not present

## 2013-02-10 DIAGNOSIS — I2510 Atherosclerotic heart disease of native coronary artery without angina pectoris: Secondary | ICD-10-CM | POA: Diagnosis not present

## 2013-02-10 DIAGNOSIS — J441 Chronic obstructive pulmonary disease with (acute) exacerbation: Secondary | ICD-10-CM | POA: Diagnosis not present

## 2013-02-10 DIAGNOSIS — J45909 Unspecified asthma, uncomplicated: Secondary | ICD-10-CM | POA: Diagnosis not present

## 2013-02-10 DIAGNOSIS — F0390 Unspecified dementia without behavioral disturbance: Secondary | ICD-10-CM | POA: Diagnosis not present

## 2013-02-10 DIAGNOSIS — I10 Essential (primary) hypertension: Secondary | ICD-10-CM | POA: Diagnosis not present

## 2013-02-10 DIAGNOSIS — E119 Type 2 diabetes mellitus without complications: Secondary | ICD-10-CM | POA: Diagnosis not present

## 2013-02-11 ENCOUNTER — Ambulatory Visit
Admission: RE | Admit: 2013-02-11 | Discharge: 2013-02-11 | Disposition: A | Discharge status: Home / Self Care | Payer: Medicare Other | Source: Ambulatory Visit | Attending: Physician Assistant | Admitting: Physician Assistant

## 2013-02-11 ENCOUNTER — Other Ambulatory Visit: Payer: Self-pay | Admitting: Family Medicine

## 2013-02-11 ENCOUNTER — Telehealth: Payer: Self-pay | Admitting: Family Medicine

## 2013-02-11 ENCOUNTER — Encounter: Payer: Self-pay | Admitting: Physician Assistant

## 2013-02-11 ENCOUNTER — Ambulatory Visit (INDEPENDENT_AMBULATORY_CARE_PROVIDER_SITE_OTHER): Payer: Medicare Other | Admitting: Physician Assistant

## 2013-02-11 VITALS — BP 132/76 | HR 72 | Temp 97.4°F | Resp 18 | Ht 67.0 in | Wt 188.0 lb

## 2013-02-11 DIAGNOSIS — E041 Nontoxic single thyroid nodule: Secondary | ICD-10-CM | POA: Diagnosis not present

## 2013-02-11 DIAGNOSIS — I2510 Atherosclerotic heart disease of native coronary artery without angina pectoris: Secondary | ICD-10-CM

## 2013-02-11 DIAGNOSIS — R079 Chest pain, unspecified: Secondary | ICD-10-CM | POA: Diagnosis not present

## 2013-02-11 DIAGNOSIS — J439 Emphysema, unspecified: Secondary | ICD-10-CM

## 2013-02-11 DIAGNOSIS — F0390 Unspecified dementia without behavioral disturbance: Secondary | ICD-10-CM

## 2013-02-11 DIAGNOSIS — F17200 Nicotine dependence, unspecified, uncomplicated: Secondary | ICD-10-CM

## 2013-02-11 DIAGNOSIS — Z9181 History of falling: Secondary | ICD-10-CM | POA: Insufficient documentation

## 2013-02-11 DIAGNOSIS — J438 Other emphysema: Secondary | ICD-10-CM

## 2013-02-11 DIAGNOSIS — R0602 Shortness of breath: Secondary | ICD-10-CM

## 2013-02-11 MED ORDER — CYANOCOBALAMIN 1000 MCG/ML IJ SOLN: 1000.0000 ug | INTRAMUSCULAR | Status: DC

## 2013-02-11 NOTE — Progress Notes (Signed)
Patient ID: Jonathan [name] MRN: [id number], DOB: 26-Oct-1940, 73 y.o. Date of Encounter: @DATE @  Chief Complaint:  Chief Complaint  Patient presents with  . hospital folow up    ? about having thyroid ultrasound    HPI: 73 y.o. year old male  presents with his wife today for followup office visit after recent hospitalization.  I have reviewed his admission H&P as well as his discharge summary. He presented with complaints of chest pain and shortness of breath. 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 Ultimately it was decided that his symptoms at admission were most likely secondary to COPD exacerbation. He was treated with Levaquin. Wife states that he has completed those antibiotics now.  Patient and wife report that those symptoms have resolved and his breathing is back to baseline.  During the hospitalization CT of the chest was done to rule out PE. This did reveal a left thyroid lobe nodule. Discharge summary states that this needs to be evaluated with an ultrasound after discharge. While the patient was in the office today we went him that this scheduled for this afternoon at 2:00 PM.  When I was reviewing the tests that were done in the hospital with the patient and wife, the wife reports that the reason that he had a CT of the head was because he actually had a fall during the hospitalization. They did a CT of head to make sure that there was no bleed secondary to the fall. As well, he now has a walker that he is using to help prevent falls. Also at the time of hospital discharge they have arranged for him to have home health nurse, and nurse aid, PT, and OT for the upcoming 6 weeks. Patient's wife mentions that the home health nurse told them if we give him a prescription for them to do the B12 injections they can do those at home rather than bringing the patient here for those.   Past Medical History  Diagnosis Date  . ED (erectile dysfunction)     . Vitamin D deficiency   . Pancreatitis   . CAD (coronary artery disease)     cath 2001- one vessel occluded (circ); others patent.  EF 55%  . B12 deficiency 06/02/10    174  . Obesity   . Chronic bronchitis   . Emphysema of lung   . Blood transfusion   . Asthma   . Anemia, iron deficiency 06/02/10    heme negative, ferritin 5  . Diabetes mellitus   . GERD (gastroesophageal reflux disease)   . Hyperlipidemia   . Hypertension   . Nephrolithiasis   . Diverticulosis   . H. pylori infection 05/08/2010  . Hiatal hernia 05/08/2010    EGD  . Gastritis 05/08/2010    EGD  . Smoker   . Dementia 10/19/2012     Home Meds: See attached medication section for current medication list. Any medications entered into computer today will not appear on this note's list. The medications listed below were entered prior to today. Current Outpatient Prescriptions on File Prior to Visit  Medication Sig Dispense Refill  . albuterol (VENTOLIN HFA) 108 (90 BASE) MCG/ACT inhaler Inhale 2 puffs into the lungs every 6 (six) hours as needed.        Marland Kitchen amLODipine (NORVASC) 10 MG tablet Take 1 tablet (10 mg total) by mouth daily.  90 tablet  3  . aspirin EC 81 MG tablet  Take 81 mg by mouth daily.      Marland Kitchen. atorvastatin (LIPITOR) 80 MG tablet Take 80 mg by mouth daily.      Marland Kitchen. donepezil (ARICEPT) 10 MG tablet Take 10 mg by mouth at bedtime.      . enalapril (VASOTEC) 20 MG tablet Take 1 tablet (20 mg total) by mouth 2 (two) times daily.  180 tablet  3  . iron polysaccharides (NU-IRON) 150 MG capsule Take 1 capsule (150 mg total) by mouth 2 (two) times daily.  60 capsule  11  . metFORMIN (GLUCOPHAGE) 1000 MG tablet Take 1 tablet (1,000 mg total) by mouth 2 (two) times daily with a meal.  180 tablet  3  . pioglitazone (ACTOS) 45 MG tablet Take 1 tablet (45 mg total) by mouth daily.  90 tablet  3  . valsartan (DIOVAN) 320 MG tablet Take 1 tablet (320 mg total) by mouth daily.  90 tablet  3  . levofloxacin (LEVAQUIN) 750 MG  tablet Take 1 tablet (750 mg total) by mouth daily.  7 tablet  0   No current facility-administered medications on file prior to visit.    Allergies: No Known Allergies  History   Social History  . Marital Status: Married    Spouse Name: N/A    Number of Children: N/A  . Years of Education: N/A   Occupational History  . Not on file.   Social History Main Topics  . Smoking status: Former Smoker    Types: Cigarettes    Quit date: 11/09/2012  . Smokeless tobacco: Never Used  . Alcohol Use: No  . Drug Use: No  . Sexual Activity: No   Other Topics Concern  . Not on file   Social History Narrative  . No narrative on file    Family History  Problem Relation Age of Onset  . Coronary artery disease Mother      Review of Systems:  See HPI for pertinent ROS. All other ROS negative.    Physical Exam: Blood pressure 132/76, pulse 72, temperature 97.4 F (36.3 C), temperature source Oral, resp. rate 18, height 5\' 7"  (1.702 m), weight 188 lb (85.276 kg), SpO2 97.00%., Body mass index is 29.44 kg/(m^2). General: WNWD WM. Appears in no acute distress. Neck: Supple. No thyromegaly. No lymphadenopathy. Lungs: Clear bilaterally to auscultation without wheezes, rales, or rhonchi. Breathing is unlabored. Heart: RRR with S1 S2. No murmurs, rubs, or gallops. Musculoskeletal:  Strength and tone normal for age. Extremities/Skin: Warm and dry. Neuro: Alert and oriented X 3. Moves all extremities spontaneously. Gait is normal. CNII-XII grossly in tact. Psych:  Responds to questions appropriately with a normal affect.     ASSESSMENT AND PLAN:  73 y.o. year old male with  1. Thyroid nodule This was seen on his CT of the chest at the hospital 02/02/13. At hospital discharge, they planned for him to have followup outpatient ultrasound of the thyroid. We have scheduled this while he was here today and his is scheduled for 2:00 this afternoon. - US Soft Tissue Head/Neck; Future  2. CAD,  NATIVE VESSEL He was seen by cardiology during the hospitalization. Cardiac enzymes were obtained. EKG and echocardiogram were also performed.  3. Chest pain See #2 above  4. Dementia His dementia seems to be progressing rapidly. See history of present illness regarding his fall risk now. Today wife also reports that he is frequently pacing the floor. On exam today when I asked the patient to take a deep  breath in and out, he did not. Then,  I stood in front of him and said it in his face multiple times and he seems to not even be over to understand what I'm saying.  Discussed with the wife whether she needs [place] to help arrange some support. As stated in the history of present illness, the time of hospital discharge they have arranged for 6 weeks of home health nurse, home health aide, PT, OT.  She says that they have too much money for him to qualify for Medicaid to pay for skilled nursing. She is well aware of options. She has recently cared for other family members with dementia and is aware of support that's available. I told her to followup with me if there is anything we can do to assist with this.  5. Emphysema of lung Reason for recent hospitalization was felt to be secondary to exacerbation of COPD. Has been treated and is back to baseline.  6. Smoker  7.s/p hospitalization for SOB (shortness of breath)--back to baseline.   8. At risk for falling He had a fall while in the hospital. Had followup head CT to rule out bleed. He is now using a walker for assistance. Has PT and OT scheduled to come to his house over the next 6 weeks. Again, discussed with the wife to let me know if there is anything that we can do to assist with further help in this area.  Prescription printed for home health nurse to give his B12 injections on a monthly basis while they're going to his home.   Signed, [address] [place], [name], [facility] 02/11/2013 10:04 AM

## 2013-02-11 NOTE — Telephone Encounter (Signed)
Pt recently discharge form hospital.  Has Home health nurses coming to the home.  When here today for follow up visit, wife stated [facility] told them she could do his monthly B-12 injection at the home if they had an order.  They could do this as long as they are still caring for him.  [facility] and gave them order for monthly B-12 injections per provider OK.  Per our records next injection due around the 9th of this month. Pt was given RX for B-12 to have at home for nurse.

## 2013-02-12 DIAGNOSIS — F0390 Unspecified dementia without behavioral disturbance: Secondary | ICD-10-CM | POA: Diagnosis not present

## 2013-02-12 DIAGNOSIS — J441 Chronic obstructive pulmonary disease with (acute) exacerbation: Secondary | ICD-10-CM | POA: Diagnosis not present

## 2013-02-12 DIAGNOSIS — E119 Type 2 diabetes mellitus without complications: Secondary | ICD-10-CM | POA: Diagnosis not present

## 2013-02-12 DIAGNOSIS — I10 Essential (primary) hypertension: Secondary | ICD-10-CM | POA: Diagnosis not present

## 2013-02-12 DIAGNOSIS — J45909 Unspecified asthma, uncomplicated: Secondary | ICD-10-CM | POA: Diagnosis not present

## 2013-02-12 DIAGNOSIS — I2510 Atherosclerotic heart disease of native coronary artery without angina pectoris: Secondary | ICD-10-CM | POA: Diagnosis not present

## 2013-02-15 DIAGNOSIS — J441 Chronic obstructive pulmonary disease with (acute) exacerbation: Secondary | ICD-10-CM | POA: Diagnosis not present

## 2013-02-15 DIAGNOSIS — I2510 Atherosclerotic heart disease of native coronary artery without angina pectoris: Secondary | ICD-10-CM | POA: Diagnosis not present

## 2013-02-15 DIAGNOSIS — E119 Type 2 diabetes mellitus without complications: Secondary | ICD-10-CM | POA: Diagnosis not present

## 2013-02-15 DIAGNOSIS — F0390 Unspecified dementia without behavioral disturbance: Secondary | ICD-10-CM | POA: Diagnosis not present

## 2013-02-15 DIAGNOSIS — J45909 Unspecified asthma, uncomplicated: Secondary | ICD-10-CM | POA: Diagnosis not present

## 2013-02-15 DIAGNOSIS — I10 Essential (primary) hypertension: Secondary | ICD-10-CM | POA: Diagnosis not present

## 2013-02-17 DIAGNOSIS — J441 Chronic obstructive pulmonary disease with (acute) exacerbation: Secondary | ICD-10-CM | POA: Diagnosis not present

## 2013-02-17 DIAGNOSIS — I2510 Atherosclerotic heart disease of native coronary artery without angina pectoris: Secondary | ICD-10-CM | POA: Diagnosis not present

## 2013-02-17 DIAGNOSIS — I10 Essential (primary) hypertension: Secondary | ICD-10-CM | POA: Diagnosis not present

## 2013-02-17 DIAGNOSIS — J45909 Unspecified asthma, uncomplicated: Secondary | ICD-10-CM | POA: Diagnosis not present

## 2013-02-17 DIAGNOSIS — E119 Type 2 diabetes mellitus without complications: Secondary | ICD-10-CM | POA: Diagnosis not present

## 2013-02-17 DIAGNOSIS — F0390 Unspecified dementia without behavioral disturbance: Secondary | ICD-10-CM | POA: Diagnosis not present

## 2013-02-19 DIAGNOSIS — I2510 Atherosclerotic heart disease of native coronary artery without angina pectoris: Secondary | ICD-10-CM | POA: Diagnosis not present

## 2013-02-19 DIAGNOSIS — E119 Type 2 diabetes mellitus without complications: Secondary | ICD-10-CM | POA: Diagnosis not present

## 2013-02-19 DIAGNOSIS — F0390 Unspecified dementia without behavioral disturbance: Secondary | ICD-10-CM | POA: Diagnosis not present

## 2013-02-19 DIAGNOSIS — I10 Essential (primary) hypertension: Secondary | ICD-10-CM | POA: Diagnosis not present

## 2013-02-19 DIAGNOSIS — J441 Chronic obstructive pulmonary disease with (acute) exacerbation: Secondary | ICD-10-CM | POA: Diagnosis not present

## 2013-02-19 DIAGNOSIS — J45909 Unspecified asthma, uncomplicated: Secondary | ICD-10-CM | POA: Diagnosis not present

## 2013-02-23 DIAGNOSIS — I2510 Atherosclerotic heart disease of native coronary artery without angina pectoris: Secondary | ICD-10-CM | POA: Diagnosis not present

## 2013-02-23 DIAGNOSIS — J45909 Unspecified asthma, uncomplicated: Secondary | ICD-10-CM | POA: Diagnosis not present

## 2013-02-23 DIAGNOSIS — I10 Essential (primary) hypertension: Secondary | ICD-10-CM | POA: Diagnosis not present

## 2013-02-23 DIAGNOSIS — E119 Type 2 diabetes mellitus without complications: Secondary | ICD-10-CM | POA: Diagnosis not present

## 2013-02-23 DIAGNOSIS — J441 Chronic obstructive pulmonary disease with (acute) exacerbation: Secondary | ICD-10-CM | POA: Diagnosis not present

## 2013-02-23 DIAGNOSIS — F0390 Unspecified dementia without behavioral disturbance: Secondary | ICD-10-CM | POA: Diagnosis not present

## 2013-02-25 DIAGNOSIS — F0390 Unspecified dementia without behavioral disturbance: Secondary | ICD-10-CM | POA: Diagnosis not present

## 2013-02-25 DIAGNOSIS — I2510 Atherosclerotic heart disease of native coronary artery without angina pectoris: Secondary | ICD-10-CM | POA: Diagnosis not present

## 2013-02-25 DIAGNOSIS — J441 Chronic obstructive pulmonary disease with (acute) exacerbation: Secondary | ICD-10-CM | POA: Diagnosis not present

## 2013-02-25 DIAGNOSIS — J45909 Unspecified asthma, uncomplicated: Secondary | ICD-10-CM | POA: Diagnosis not present

## 2013-02-25 DIAGNOSIS — I10 Essential (primary) hypertension: Secondary | ICD-10-CM | POA: Diagnosis not present

## 2013-02-25 DIAGNOSIS — E119 Type 2 diabetes mellitus without complications: Secondary | ICD-10-CM | POA: Diagnosis not present

## 2013-02-26 DIAGNOSIS — E119 Type 2 diabetes mellitus without complications: Secondary | ICD-10-CM | POA: Diagnosis not present

## 2013-02-26 DIAGNOSIS — H2510 Age-related nuclear cataract, unspecified eye: Secondary | ICD-10-CM | POA: Diagnosis not present

## 2013-02-26 DIAGNOSIS — H01009 Unspecified blepharitis unspecified eye, unspecified eyelid: Secondary | ICD-10-CM | POA: Diagnosis not present

## 2013-02-26 DIAGNOSIS — H5200 Hypermetropia, unspecified eye: Secondary | ICD-10-CM | POA: Diagnosis not present

## 2013-02-26 LAB — HM DIABETES EYE EXAM

## 2013-03-03 DIAGNOSIS — E119 Type 2 diabetes mellitus without complications: Secondary | ICD-10-CM | POA: Diagnosis not present

## 2013-03-03 DIAGNOSIS — F0390 Unspecified dementia without behavioral disturbance: Secondary | ICD-10-CM | POA: Diagnosis not present

## 2013-03-03 DIAGNOSIS — J45909 Unspecified asthma, uncomplicated: Secondary | ICD-10-CM | POA: Diagnosis not present

## 2013-03-03 DIAGNOSIS — J441 Chronic obstructive pulmonary disease with (acute) exacerbation: Secondary | ICD-10-CM | POA: Diagnosis not present

## 2013-03-03 DIAGNOSIS — I2510 Atherosclerotic heart disease of native coronary artery without angina pectoris: Secondary | ICD-10-CM | POA: Diagnosis not present

## 2013-03-03 DIAGNOSIS — I10 Essential (primary) hypertension: Secondary | ICD-10-CM | POA: Diagnosis not present

## 2013-03-05 DIAGNOSIS — I2510 Atherosclerotic heart disease of native coronary artery without angina pectoris: Secondary | ICD-10-CM | POA: Diagnosis not present

## 2013-03-05 DIAGNOSIS — E119 Type 2 diabetes mellitus without complications: Secondary | ICD-10-CM | POA: Diagnosis not present

## 2013-03-05 DIAGNOSIS — F0390 Unspecified dementia without behavioral disturbance: Secondary | ICD-10-CM | POA: Diagnosis not present

## 2013-03-05 DIAGNOSIS — J441 Chronic obstructive pulmonary disease with (acute) exacerbation: Secondary | ICD-10-CM | POA: Diagnosis not present

## 2013-03-05 DIAGNOSIS — J45909 Unspecified asthma, uncomplicated: Secondary | ICD-10-CM | POA: Diagnosis not present

## 2013-03-05 DIAGNOSIS — I10 Essential (primary) hypertension: Secondary | ICD-10-CM | POA: Diagnosis not present

## 2013-03-09 DIAGNOSIS — J441 Chronic obstructive pulmonary disease with (acute) exacerbation: Secondary | ICD-10-CM | POA: Diagnosis not present

## 2013-03-09 DIAGNOSIS — F0390 Unspecified dementia without behavioral disturbance: Secondary | ICD-10-CM | POA: Diagnosis not present

## 2013-03-09 DIAGNOSIS — E119 Type 2 diabetes mellitus without complications: Secondary | ICD-10-CM | POA: Diagnosis not present

## 2013-03-09 DIAGNOSIS — I2510 Atherosclerotic heart disease of native coronary artery without angina pectoris: Secondary | ICD-10-CM | POA: Diagnosis not present

## 2013-03-09 DIAGNOSIS — J45909 Unspecified asthma, uncomplicated: Secondary | ICD-10-CM | POA: Diagnosis not present

## 2013-03-09 DIAGNOSIS — I10 Essential (primary) hypertension: Secondary | ICD-10-CM | POA: Diagnosis not present

## 2013-03-11 DIAGNOSIS — I2510 Atherosclerotic heart disease of native coronary artery without angina pectoris: Secondary | ICD-10-CM | POA: Diagnosis not present

## 2013-03-11 DIAGNOSIS — J45909 Unspecified asthma, uncomplicated: Secondary | ICD-10-CM | POA: Diagnosis not present

## 2013-03-11 DIAGNOSIS — I10 Essential (primary) hypertension: Secondary | ICD-10-CM | POA: Diagnosis not present

## 2013-03-11 DIAGNOSIS — F0390 Unspecified dementia without behavioral disturbance: Secondary | ICD-10-CM | POA: Diagnosis not present

## 2013-03-11 DIAGNOSIS — J441 Chronic obstructive pulmonary disease with (acute) exacerbation: Secondary | ICD-10-CM | POA: Diagnosis not present

## 2013-03-11 DIAGNOSIS — E119 Type 2 diabetes mellitus without complications: Secondary | ICD-10-CM | POA: Diagnosis not present

## 2013-03-15 ENCOUNTER — Ambulatory Visit: Payer: Medicare Other | Admitting: Physician Assistant

## 2013-03-16 ENCOUNTER — Telehealth: Payer: Self-pay | Admitting: Family Medicine

## 2013-03-16 DIAGNOSIS — J45909 Unspecified asthma, uncomplicated: Secondary | ICD-10-CM | POA: Diagnosis not present

## 2013-03-16 DIAGNOSIS — I2510 Atherosclerotic heart disease of native coronary artery without angina pectoris: Secondary | ICD-10-CM | POA: Diagnosis not present

## 2013-03-16 DIAGNOSIS — I10 Essential (primary) hypertension: Secondary | ICD-10-CM | POA: Diagnosis not present

## 2013-03-16 DIAGNOSIS — E119 Type 2 diabetes mellitus without complications: Secondary | ICD-10-CM | POA: Diagnosis not present

## 2013-03-16 DIAGNOSIS — J441 Chronic obstructive pulmonary disease with (acute) exacerbation: Secondary | ICD-10-CM | POA: Diagnosis not present

## 2013-03-16 DIAGNOSIS — F0390 Unspecified dementia without behavioral disturbance: Secondary | ICD-10-CM | POA: Diagnosis not present

## 2013-03-16 NOTE — Telephone Encounter (Signed)
Message copied by Donne AnonPLUMMER, Shon Indelicato M on Tue Mar 16, 2013 11:14 AM ------      Message from: Ricard DillonWILLIS, SANDY B      Created: Tue Mar 16, 2013 10:51 AM      Regarding: FW: Verbal Order ([facility])      Contact: [phone]                   ----- Message -----         From: Harriet Massonhelsea N Roberts         Sent: 03/16/2013  10:35 AM           To: Ricard DillonSandy B Willis      Subject: Verbal Order ([facility])                         Enrique SackKendra a physical therapist from [facility] is needing 3 things            1)verbal order for speech therapy       2) extend home to work on balance      2 week for 3 week       3) to let dr pickard know that pt did fall on steps did not hurt his head he just fell on his bottom ------

## 2013-03-16 NOTE — Telephone Encounter (Signed)
Pt currently under Home Care.  Enrique SackKendra from [facility] called to get approval for Speech Therapy, extended PT for balance.  Also to let us know about fall. Enrique SackKendra given verbal approval for speech and extended PT.  Order will follow for signing.

## 2013-03-16 NOTE — Telephone Encounter (Signed)
Agree. Approved. 

## 2013-03-17 DIAGNOSIS — J441 Chronic obstructive pulmonary disease with (acute) exacerbation: Secondary | ICD-10-CM | POA: Diagnosis not present

## 2013-03-17 DIAGNOSIS — E119 Type 2 diabetes mellitus without complications: Secondary | ICD-10-CM | POA: Diagnosis not present

## 2013-03-17 DIAGNOSIS — I10 Essential (primary) hypertension: Secondary | ICD-10-CM | POA: Diagnosis not present

## 2013-03-17 DIAGNOSIS — I2510 Atherosclerotic heart disease of native coronary artery without angina pectoris: Secondary | ICD-10-CM | POA: Diagnosis not present

## 2013-03-17 DIAGNOSIS — J45909 Unspecified asthma, uncomplicated: Secondary | ICD-10-CM | POA: Diagnosis not present

## 2013-03-17 DIAGNOSIS — F0390 Unspecified dementia without behavioral disturbance: Secondary | ICD-10-CM | POA: Diagnosis not present

## 2013-03-18 DIAGNOSIS — J45909 Unspecified asthma, uncomplicated: Secondary | ICD-10-CM | POA: Diagnosis not present

## 2013-03-18 DIAGNOSIS — I10 Essential (primary) hypertension: Secondary | ICD-10-CM | POA: Diagnosis not present

## 2013-03-18 DIAGNOSIS — I2510 Atherosclerotic heart disease of native coronary artery without angina pectoris: Secondary | ICD-10-CM | POA: Diagnosis not present

## 2013-03-18 DIAGNOSIS — E119 Type 2 diabetes mellitus without complications: Secondary | ICD-10-CM | POA: Diagnosis not present

## 2013-03-18 DIAGNOSIS — F0390 Unspecified dementia without behavioral disturbance: Secondary | ICD-10-CM | POA: Diagnosis not present

## 2013-03-18 DIAGNOSIS — J441 Chronic obstructive pulmonary disease with (acute) exacerbation: Secondary | ICD-10-CM | POA: Diagnosis not present

## 2013-03-19 DIAGNOSIS — J45909 Unspecified asthma, uncomplicated: Secondary | ICD-10-CM | POA: Diagnosis not present

## 2013-03-19 DIAGNOSIS — F0390 Unspecified dementia without behavioral disturbance: Secondary | ICD-10-CM | POA: Diagnosis not present

## 2013-03-19 DIAGNOSIS — J441 Chronic obstructive pulmonary disease with (acute) exacerbation: Secondary | ICD-10-CM | POA: Diagnosis not present

## 2013-03-19 DIAGNOSIS — E119 Type 2 diabetes mellitus without complications: Secondary | ICD-10-CM | POA: Diagnosis not present

## 2013-03-19 DIAGNOSIS — I2510 Atherosclerotic heart disease of native coronary artery without angina pectoris: Secondary | ICD-10-CM | POA: Diagnosis not present

## 2013-03-19 DIAGNOSIS — I10 Essential (primary) hypertension: Secondary | ICD-10-CM | POA: Diagnosis not present

## 2013-03-22 DIAGNOSIS — I10 Essential (primary) hypertension: Secondary | ICD-10-CM | POA: Diagnosis not present

## 2013-03-22 DIAGNOSIS — F0390 Unspecified dementia without behavioral disturbance: Secondary | ICD-10-CM | POA: Diagnosis not present

## 2013-03-22 DIAGNOSIS — J441 Chronic obstructive pulmonary disease with (acute) exacerbation: Secondary | ICD-10-CM | POA: Diagnosis not present

## 2013-03-22 DIAGNOSIS — J45909 Unspecified asthma, uncomplicated: Secondary | ICD-10-CM | POA: Diagnosis not present

## 2013-03-22 DIAGNOSIS — I2510 Atherosclerotic heart disease of native coronary artery without angina pectoris: Secondary | ICD-10-CM | POA: Diagnosis not present

## 2013-03-22 DIAGNOSIS — E119 Type 2 diabetes mellitus without complications: Secondary | ICD-10-CM | POA: Diagnosis not present

## 2013-03-23 DIAGNOSIS — E119 Type 2 diabetes mellitus without complications: Secondary | ICD-10-CM | POA: Diagnosis not present

## 2013-03-23 DIAGNOSIS — I2510 Atherosclerotic heart disease of native coronary artery without angina pectoris: Secondary | ICD-10-CM | POA: Diagnosis not present

## 2013-03-23 DIAGNOSIS — F0390 Unspecified dementia without behavioral disturbance: Secondary | ICD-10-CM | POA: Diagnosis not present

## 2013-03-23 DIAGNOSIS — J45909 Unspecified asthma, uncomplicated: Secondary | ICD-10-CM | POA: Diagnosis not present

## 2013-03-23 DIAGNOSIS — I10 Essential (primary) hypertension: Secondary | ICD-10-CM | POA: Diagnosis not present

## 2013-03-23 DIAGNOSIS — J441 Chronic obstructive pulmonary disease with (acute) exacerbation: Secondary | ICD-10-CM | POA: Diagnosis not present

## 2013-03-25 DIAGNOSIS — J45909 Unspecified asthma, uncomplicated: Secondary | ICD-10-CM | POA: Diagnosis not present

## 2013-03-25 DIAGNOSIS — I10 Essential (primary) hypertension: Secondary | ICD-10-CM | POA: Diagnosis not present

## 2013-03-25 DIAGNOSIS — E119 Type 2 diabetes mellitus without complications: Secondary | ICD-10-CM | POA: Diagnosis not present

## 2013-03-25 DIAGNOSIS — F0390 Unspecified dementia without behavioral disturbance: Secondary | ICD-10-CM | POA: Diagnosis not present

## 2013-03-25 DIAGNOSIS — I2510 Atherosclerotic heart disease of native coronary artery without angina pectoris: Secondary | ICD-10-CM | POA: Diagnosis not present

## 2013-03-25 DIAGNOSIS — J441 Chronic obstructive pulmonary disease with (acute) exacerbation: Secondary | ICD-10-CM | POA: Diagnosis not present

## 2013-03-29 DIAGNOSIS — I2510 Atherosclerotic heart disease of native coronary artery without angina pectoris: Secondary | ICD-10-CM | POA: Diagnosis not present

## 2013-03-29 DIAGNOSIS — E119 Type 2 diabetes mellitus without complications: Secondary | ICD-10-CM | POA: Diagnosis not present

## 2013-03-29 DIAGNOSIS — J45909 Unspecified asthma, uncomplicated: Secondary | ICD-10-CM | POA: Diagnosis not present

## 2013-03-29 DIAGNOSIS — J441 Chronic obstructive pulmonary disease with (acute) exacerbation: Secondary | ICD-10-CM | POA: Diagnosis not present

## 2013-03-29 DIAGNOSIS — I10 Essential (primary) hypertension: Secondary | ICD-10-CM | POA: Diagnosis not present

## 2013-03-29 DIAGNOSIS — F0390 Unspecified dementia without behavioral disturbance: Secondary | ICD-10-CM | POA: Diagnosis not present

## 2013-03-30 DIAGNOSIS — F0390 Unspecified dementia without behavioral disturbance: Secondary | ICD-10-CM | POA: Diagnosis not present

## 2013-03-30 DIAGNOSIS — J441 Chronic obstructive pulmonary disease with (acute) exacerbation: Secondary | ICD-10-CM | POA: Diagnosis not present

## 2013-03-30 DIAGNOSIS — J45909 Unspecified asthma, uncomplicated: Secondary | ICD-10-CM | POA: Diagnosis not present

## 2013-03-30 DIAGNOSIS — I2510 Atherosclerotic heart disease of native coronary artery without angina pectoris: Secondary | ICD-10-CM | POA: Diagnosis not present

## 2013-03-30 DIAGNOSIS — E119 Type 2 diabetes mellitus without complications: Secondary | ICD-10-CM | POA: Diagnosis not present

## 2013-03-30 DIAGNOSIS — I10 Essential (primary) hypertension: Secondary | ICD-10-CM | POA: Diagnosis not present

## 2013-03-31 ENCOUNTER — Encounter: Payer: Self-pay | Admitting: Family Medicine

## 2013-04-01 DIAGNOSIS — E119 Type 2 diabetes mellitus without complications: Secondary | ICD-10-CM | POA: Diagnosis not present

## 2013-04-01 DIAGNOSIS — I10 Essential (primary) hypertension: Secondary | ICD-10-CM | POA: Diagnosis not present

## 2013-04-01 DIAGNOSIS — F0390 Unspecified dementia without behavioral disturbance: Secondary | ICD-10-CM | POA: Diagnosis not present

## 2013-04-01 DIAGNOSIS — J441 Chronic obstructive pulmonary disease with (acute) exacerbation: Secondary | ICD-10-CM | POA: Diagnosis not present

## 2013-04-01 DIAGNOSIS — I2510 Atherosclerotic heart disease of native coronary artery without angina pectoris: Secondary | ICD-10-CM | POA: Diagnosis not present

## 2013-04-01 DIAGNOSIS — J45909 Unspecified asthma, uncomplicated: Secondary | ICD-10-CM | POA: Diagnosis not present

## 2013-04-19 ENCOUNTER — Other Ambulatory Visit: Payer: Self-pay | Admitting: Family Medicine

## 2013-04-19 ENCOUNTER — Encounter: Payer: Self-pay | Admitting: Physician Assistant

## 2013-04-19 ENCOUNTER — Ambulatory Visit (INDEPENDENT_AMBULATORY_CARE_PROVIDER_SITE_OTHER): Payer: Medicare Other | Admitting: Physician Assistant

## 2013-04-19 VITALS — BP 140/82 | HR 64 | Temp 97.8°F | Resp 18 | Wt 179.0 lb

## 2013-04-19 DIAGNOSIS — I2510 Atherosclerotic heart disease of native coronary artery without angina pectoris: Secondary | ICD-10-CM

## 2013-04-19 DIAGNOSIS — R5383 Other fatigue: Secondary | ICD-10-CM

## 2013-04-19 DIAGNOSIS — Z9181 History of falling: Secondary | ICD-10-CM

## 2013-04-19 DIAGNOSIS — E785 Hyperlipidemia, unspecified: Secondary | ICD-10-CM

## 2013-04-19 DIAGNOSIS — I798 Other disorders of arteries, arterioles and capillaries in diseases classified elsewhere: Secondary | ICD-10-CM

## 2013-04-19 DIAGNOSIS — IMO0002 Reserved for concepts with insufficient information to code with codable children: Secondary | ICD-10-CM

## 2013-04-19 DIAGNOSIS — F0390 Unspecified dementia without behavioral disturbance: Secondary | ICD-10-CM | POA: Diagnosis not present

## 2013-04-19 DIAGNOSIS — E1351 Other specified diabetes mellitus with diabetic peripheral angiopathy without gangrene: Secondary | ICD-10-CM

## 2013-04-19 DIAGNOSIS — R0989 Other specified symptoms and signs involving the circulatory and respiratory systems: Secondary | ICD-10-CM

## 2013-04-19 DIAGNOSIS — R5381 Other malaise: Secondary | ICD-10-CM

## 2013-04-19 DIAGNOSIS — E538 Deficiency of other specified B group vitamins: Secondary | ICD-10-CM

## 2013-04-19 DIAGNOSIS — I10 Essential (primary) hypertension: Secondary | ICD-10-CM | POA: Diagnosis not present

## 2013-04-19 DIAGNOSIS — J438 Other emphysema: Secondary | ICD-10-CM

## 2013-04-19 DIAGNOSIS — J439 Emphysema, unspecified: Secondary | ICD-10-CM

## 2013-04-19 DIAGNOSIS — D509 Iron deficiency anemia, unspecified: Secondary | ICD-10-CM

## 2013-04-19 DIAGNOSIS — F17200 Nicotine dependence, unspecified, uncomplicated: Secondary | ICD-10-CM

## 2013-04-19 DIAGNOSIS — E1365 Other specified diabetes mellitus with hyperglycemia: Secondary | ICD-10-CM

## 2013-04-19 LAB — COMPLETE METABOLIC PANEL WITH GFR
ALT: 16 U/L (ref 0–53)
AST: 14 U/L (ref 0–37)
Albumin: 4 g/dL (ref 3.5–5.2)
Alkaline Phosphatase: 91 U/L (ref 39–117)
BILIRUBIN TOTAL: 0.5 mg/dL (ref 0.2–1.2)
BUN: 12 mg/dL (ref 6–23)
CALCIUM: 9.4 mg/dL (ref 8.4–10.5)
CO2: 30 meq/L (ref 19–32)
CREATININE: 0.94 mg/dL (ref 0.50–1.35)
Chloride: 100 mEq/L (ref 96–112)
GFR, EST NON AFRICAN AMERICAN: 81 mL/min
Glucose, Bld: 137 mg/dL — ABNORMAL HIGH (ref 70–99)
Potassium: 3.8 mEq/L (ref 3.5–5.3)
Sodium: 140 mEq/L (ref 135–145)
Total Protein: 6.3 g/dL (ref 6.0–8.3)

## 2013-04-19 LAB — CBC WITH DIFFERENTIAL/PLATELET
Basophils Absolute: 0 10*3/uL (ref 0.0–0.1)
Basophils Relative: 0 % (ref 0–1)
EOS ABS: 0.1 10*3/uL (ref 0.0–0.7)
EOS PCT: 1 % (ref 0–5)
HEMATOCRIT: 40.6 % (ref 39.0–52.0)
Hemoglobin: 13.5 g/dL (ref 13.0–17.0)
Lymphocytes Relative: 17 % (ref 12–46)
Lymphs Abs: 1.2 10*3/uL (ref 0.7–4.0)
MCH: 31.8 pg (ref 26.0–34.0)
MCHC: 33.3 g/dL (ref 30.0–36.0)
MCV: 95.5 fL (ref 78.0–100.0)
MONO ABS: 0.5 10*3/uL (ref 0.1–1.0)
Monocytes Relative: 7 % (ref 3–12)
Neutro Abs: 5.2 10*3/uL (ref 1.7–7.7)
Neutrophils Relative %: 75 % (ref 43–77)
PLATELETS: 403 10*3/uL — AB (ref 150–400)
RBC: 4.25 MIL/uL (ref 4.22–5.81)
RDW: 13.8 % (ref 11.5–15.5)
WBC: 6.9 10*3/uL (ref 4.0–10.5)

## 2013-04-19 LAB — LIPID PANEL
CHOLESTEROL: 116 mg/dL (ref 0–200)
HDL: 46 mg/dL (ref >39)
LDL CALC: 53 mg/dL (ref 0–99)
TRIGLYCERIDES: 84 mg/dL (ref <150)
Total CHOL/HDL Ratio: 2.5 Ratio
VLDL: 17 mg/dL (ref 0–40)

## 2013-04-19 LAB — HEMOGLOBIN A1C
Hgb A1c MFr Bld: 6.5 % — ABNORMAL HIGH (ref <5.7)
Mean Plasma Glucose: 140 mg/dL — ABNORMAL HIGH (ref <117)

## 2013-04-19 MED ORDER — METFORMIN HCL 1000 MG PO TABS: 1000.0000 mg | ORAL_TABLET | Freq: Two times a day (BID) | ORAL | Status: DC

## 2013-04-19 MED ORDER — BENAZEPRIL HCL 20 MG PO TABS: 20.0000 mg | ORAL_TABLET | Freq: Every day | ORAL | Status: DC

## 2013-04-19 MED ORDER — AMLODIPINE BESYLATE 10 MG PO TABS: 10.0000 mg | ORAL_TABLET | Freq: Every day | ORAL | Status: DC

## 2013-04-19 MED ORDER — ATORVASTATIN CALCIUM 80 MG PO TABS: 80.0000 mg | ORAL_TABLET | Freq: Every day | ORAL | Status: DC

## 2013-04-19 MED ORDER — LOSARTAN POTASSIUM 100 MG PO TABS: 100.0000 mg | ORAL_TABLET | Freq: Every day | ORAL | Status: DC

## 2013-04-19 MED ORDER — PIOGLITAZONE HCL 45 MG PO TABS: 45.0000 mg | ORAL_TABLET | Freq: Every day | ORAL | Status: DC

## 2013-04-19 MED ORDER — POLYSACCHARIDE IRON COMPLEX 150 MG PO CAPS: 150.0000 mg | ORAL_CAPSULE | Freq: Two times a day (BID) | ORAL | Status: DC

## 2013-04-19 MED ORDER — CYANOCOBALAMIN 1000 MCG/ML IJ SOLN
1000.0000 ug | Freq: Once | INTRAMUSCULAR | Status: AC
  Administered 2013-04-19: 1000 ug via INTRAMUSCULAR

## 2013-04-19 NOTE — Progress Notes (Signed)
Patient ID: Jonathan [name] MRN: [id number], DOB: Jun 08, 1940, 73 y.o. Date of Encounter: @DATE @  Chief Complaint:  Chief Complaint  Patient presents with  . 3 month visit    wife states he eats nothing, very dark loose stools, some incontinence    HPI: 73 y.o. year old male  presents with his wife for a visit today.  Today she is asking about stopping his Aricept. She reports that his dementia is worsening and definitely has not improved so she is not sure that he is getting any benefit from the medicine. As well she is wondering if some of his current symptoms are secondary to side effects of Aricept. She reports that he is having no appetite and is eating very little. She also wonders if the Aricept is causing his change in stools. As well she says that she read that she that you're not supposed take Aricept with COPD.  I asked her if we can help her with getting furhter assistance. At prior visits she had reported to me that she has helped care for multiple family members with dementia and is well aware of the support system available. She is also very familiar with the natural progression of dementia.  She states that currently she is paying out of pocket herself for a sitter on Fridays to come be with him while she goes to the grocery store etc. The rest of the time she is providing care. She states that for 6 weeks after his hospitalization home health nurse and physical therapy came to the house but that stopped after 6 weeks post hospitalization.   Past Medical History  Diagnosis Date  . ED (erectile dysfunction)   . Vitamin D deficiency   . Pancreatitis   . CAD (coronary artery disease)     cath 2001- one vessel occluded (circ); others patent.  EF 55%  . B12 deficiency 06/02/10    174  . Obesity   . Chronic bronchitis   . Emphysema of lung   . Blood transfusion   . Asthma   . Anemia, iron deficiency 06/02/10    heme negative, ferritin 5  . Diabetes mellitus   . GERD  (gastroesophageal reflux disease)   . Hyperlipidemia   . Hypertension   . Nephrolithiasis   . Diverticulosis   . H. pylori infection 05/08/2010  . Hiatal hernia 05/08/2010    EGD  . Gastritis 05/08/2010    EGD  . Smoker   . Dementia 10/19/2012     Home Meds: See attached medication section for current medication list. Any medications entered into computer today will not appear on this note's list. The medications listed below were entered prior to today. Current Outpatient Prescriptions on File Prior to Visit  Medication Sig Dispense Refill  . albuterol (VENTOLIN HFA) 108 (90 BASE) MCG/ACT inhaler Inhale 2 puffs into the lungs every 6 (six) hours as needed.        Marland Kitchen. aspirin EC 81 MG tablet Take 81 mg by mouth daily.      . cyanocobalamin (,VITAMIN B-12,) 1000 MCG/ML injection Inject 1 mL (1,000 mcg total) into the muscle every 30 (thirty) days.  1 mL  3   No current facility-administered medications on file prior to visit.    Allergies: No Known Allergies  History   Social History  . Marital Status: Married    Spouse Name: N/A    Number of Children: N/A  . Years of Education: N/A   Occupational History  .  Not on file.   Social History Main Topics  . Smoking status: Former Smoker    Types: Cigarettes    Quit date: 11/09/2012  . Smokeless tobacco: Never Used  . Alcohol Use: No  . Drug Use: No  . Sexual Activity: No   Other Topics Concern  . Not on file   Social History Narrative  . No narrative on file    Family History  Problem Relation Age of Onset  . Coronary artery disease Mother      Review of Systems:  See HPI for pertinent ROS. All other ROS negative.    Physical Exam: Blood pressure 140/82, pulse 64, temperature 97.8 F (36.6 C), temperature source Oral, resp. rate 18, weight 179 lb (81.194 kg)., Body mass index is 28.03 kg/(m^2). General: WNWD WM. He appears pale today.Appears in no acute distress. Neck: Supple. No thyromegaly. No  lymphadenopathy. Lungs: Clear bilaterally to auscultation without wheezes, rales, or rhonchi. Breathing is unlabored. Heart: RRR with S1 S2. No murmurs, rubs, or gallops. Abdomen: Soft, non-tender, non-distended with normoactive bowel sounds. No hepatomegaly. No rebound/guarding. No obvious abdominal masses. Musculoskeletal:  Strength and tone normal for age. Extremities/Skin: Warm and dry. 1+ Bilateral LE edema.  Neuro: Alert.  Moves all extremities spontaneously. Gait is normal. CNII-XII grossly in tact. Psych: His wife provides most of information today sec to pts dementia.     ASSESSMENT AND PLAN:  73 y.o. year old male with  1. Dementia  2. DM (diabetes mellitus), secondary, uncontrolled, with peripheral vascular complications - COMPLETE METABOLIC PANEL WITH GFR - Hemoglobin A1c  3. Hypertension - COMPLETE METABOLIC PANEL WITH GFR  4. Hyperlipidemia - COMPLETE METABOLIC PANEL WITH GFR - Lipid panel  5. B12 deficiency - cyanocobalamin ((VITAMIN B-12)) injection 1,000 mcg; Inject 1 mL (1,000 mcg total) into the muscle once.  6. At risk for falling  7. Smoker  8. Emphysema of lung  9. CAD, NATIVE VESSEL  10. Right carotid bruit  11. Anemia, iron deficiency - CBC with Differential  12. Other malaise and fatigue - CBC with Differential - COMPLETE METABOLIC PANEL WITH GFR - TSH  Today patient's wife brought in paperwork regarding the fact that his insurance will not currently cover enalapril. It does cover benazepril. Therefore the enalapril 20 mg is changed to benazepril 20 mg. The insurance does not cover Diovan but does cover losartan so Diovan 320 mg is being changed to losartan 100 mg daily.  Will obtain these labs and then call patient's wife with the results.if  Labs show abnormalities then we'll followup at that time. Otherwise if labs are stable and patient remained stable , then can wait 3 months for followup visit. Follow up sooner if  needed.   Murray HodgkinsSigned, Lory Nowaczyk Beth HepzibahDixon, [place], [facility] 04/19/2013 3:15 PM

## 2013-04-19 NOTE — Telephone Encounter (Signed)
Pt here for OV.  Instructed by provider to refill all medications..  Enalapril change to Benazepril 20 mg.  Valsartan changed to Losartan 100 mg because insurance no longer covers other meds.

## 2013-04-20 ENCOUNTER — Telehealth: Payer: Self-pay | Admitting: Family Medicine

## 2013-04-20 LAB — TSH: TSH: 0.726 u[IU]/mL (ref 0.350–4.500)

## 2013-04-20 NOTE — Telephone Encounter (Signed)
Message copied by Donne AnonPLUMMER, Samil Mecham M on Tue Apr 20, 2013  9:44 AM ------      Message from: Allayne ButcherIXON, MARY      Created: Tue Apr 20, 2013  8:24 AM       All labs actually look great.      Cholesterol has actually gotten very low secondary to medicines and decreased eating.      Currently taking Lipitor 80 mg. Can decrease this to 40 mg. I see that he's been getting 90 pills supply so if he has a lot of the 80 mg tablets,  he can cut them in half. Also place order to remove the Lipitor 80 and send new order for Lipitor 40.      No other medication changes.      Start drinking Ensure at least once per day as discussed at the office visit.      Calll me if he is having any low blood sugars given his decreased food intake. Current A1c is stable so will not change any diabetes medicines at this time. ------

## 2013-04-20 NOTE — Telephone Encounter (Signed)
Spoke to wife about lab results.  Understands to decrease Lipitor to 40 mg. (can cut 80 mg tabs in half to use up what he has at home)  Next refill will order 40 mg.  Encouraged Ensure supplements daily.  Told wife to report to us if any low blood sugars are noted.

## 2013-04-25 ENCOUNTER — Encounter: Payer: Self-pay | Admitting: Physician Assistant

## 2013-04-26 MED ORDER — DONEPEZIL HCL 5 MG PO TABS: 5.0000 mg | ORAL_TABLET | Freq: Every day | ORAL | Status: DC

## 2013-04-26 NOTE — Telephone Encounter (Signed)
It is okay to go ahead and refill this and give him enough refills to last 9 months.

## 2013-05-07 ENCOUNTER — Encounter: Payer: Self-pay | Admitting: Physician Assistant

## 2013-05-10 ENCOUNTER — Encounter: Payer: Self-pay | Admitting: *Deleted

## 2013-05-11 ENCOUNTER — Other Ambulatory Visit: Payer: Self-pay | Admitting: *Deleted

## 2013-05-11 MED ORDER — ZOLPIDEM TARTRATE 10 MG PO TABS: 10.0000 mg | ORAL_TABLET | Freq: Every evening | ORAL | Status: DC | PRN

## 2013-05-11 NOTE — Telephone Encounter (Signed)
My Patient-- Jonathan [name]---DOB 1940-02-16  has sent me an e-mail that he needs a medication to use at night for insomnia.  I e-mailed him back that I would have nurse send a prescription for Ambien.  Please send prescription for Ambien 10 mg 1 po QHS prn  #30+2 refills   [facility]

## 2013-05-11 NOTE — Telephone Encounter (Signed)
This encounter was created in error - please disregard.

## 2013-05-21 ENCOUNTER — Ambulatory Visit (INDEPENDENT_AMBULATORY_CARE_PROVIDER_SITE_OTHER): Payer: Medicare Other | Admitting: *Deleted

## 2013-05-21 DIAGNOSIS — E538 Deficiency of other specified B group vitamins: Secondary | ICD-10-CM

## 2013-05-21 MED ORDER — CYANOCOBALAMIN 1000 MCG/ML IJ SOLN
1000.0000 ug | Freq: Once | INTRAMUSCULAR | Status: AC
  Administered 2013-05-21: 1000 ug via INTRAMUSCULAR

## 2013-06-07 ENCOUNTER — Telehealth: Payer: Self-pay | Admitting: Family Medicine

## 2013-06-07 ENCOUNTER — Other Ambulatory Visit: Payer: Self-pay | Admitting: Family Medicine

## 2013-06-07 MED ORDER — AMLODIPINE BESYLATE 10 MG PO TABS: 10.0000 mg | ORAL_TABLET | Freq: Every day | ORAL | Status: DC

## 2013-06-07 MED ORDER — METFORMIN HCL 1000 MG PO TABS: 1000.0000 mg | ORAL_TABLET | Freq: Two times a day (BID) | ORAL | Status: DC

## 2013-06-07 NOTE — Telephone Encounter (Signed)
Medication refilled per protocol. 

## 2013-06-08 ENCOUNTER — Telehealth: Payer: Self-pay | Admitting: Family Medicine

## 2013-06-08 NOTE — Telephone Encounter (Signed)
Error

## 2013-06-11 ENCOUNTER — Other Ambulatory Visit: Payer: Self-pay | Admitting: Family Medicine

## 2013-06-11 MED ORDER — METFORMIN HCL 1000 MG PO TABS: 1000.0000 mg | ORAL_TABLET | Freq: Two times a day (BID) | ORAL | Status: DC

## 2013-06-14 ENCOUNTER — Encounter: Payer: Self-pay | Admitting: Family Medicine

## 2013-06-14 ENCOUNTER — Telehealth: Payer: Self-pay | Admitting: Family Medicine

## 2013-06-14 NOTE — Telephone Encounter (Signed)
Message copied by Donne Anon on Mon Jun 14, 2013  8:47 AM ------      Message from: Malvin Johns      Created: Mon Jun 14, 2013  8:38 AM       [phone]      Patients wife is calling about rx being faxed his  for metformin and we have denied it according to her, and also he got jury summons and he has dementia according to his wife and he cannot attend jury duty ------

## 2013-06-14 NOTE — Telephone Encounter (Signed)
Agree. Letter signed for no jury duty.

## 2013-06-14 NOTE — Telephone Encounter (Signed)
Spoke to wife.  Metformin refill has been addressed.  Pt with Dementia, obviously can not perform jury duty.  Letter written and faxed to Bary Castilla at [phone].  File # [phone] to report 07/12/13.

## 2013-06-18 ENCOUNTER — Telehealth: Payer: Self-pay | Admitting: Family Medicine

## 2013-06-18 DIAGNOSIS — IMO0001 Reserved for inherently not codable concepts without codable children: Secondary | ICD-10-CM

## 2013-06-18 DIAGNOSIS — E1165 Type 2 diabetes mellitus with hyperglycemia: Principal | ICD-10-CM

## 2013-06-18 NOTE — Telephone Encounter (Signed)
Message copied by Donne AnonPLUMMER, Rox Mcgriff M on Fri Jun 18, 2013 11:16 AM ------      Message from: Harriet MassonOBERTS, CHELSEA N      Created: Fri Jun 18, 2013 10:45 AM      Regarding: Med authorized      Contact: [phone]       Pt wife is needing to speak to you about his prescription and they have received a noticed stating it has not be authorized  ------

## 2013-06-18 NOTE — Telephone Encounter (Signed)
Called pharmacy about Metformin refill.  Wife states they are telling her we have denied it.  I spoke to pharmacist to confirm order sent on 06/11/13.  He does not know what the confusion is but states med will be sent out TODAY.  Wife called back and made aware.

## 2013-06-22 ENCOUNTER — Ambulatory Visit (INDEPENDENT_AMBULATORY_CARE_PROVIDER_SITE_OTHER): Payer: Medicare Other | Admitting: Family Medicine

## 2013-06-22 DIAGNOSIS — E538 Deficiency of other specified B group vitamins: Secondary | ICD-10-CM | POA: Diagnosis not present

## 2013-06-22 MED ORDER — CYANOCOBALAMIN 1000 MCG/ML IJ SOLN
1000.0000 ug | Freq: Once | INTRAMUSCULAR | Status: AC
  Administered 2013-06-22: 1000 ug via INTRAMUSCULAR

## 2013-07-09 ENCOUNTER — Encounter: Payer: Self-pay | Admitting: Physician Assistant

## 2013-07-12 ENCOUNTER — Telehealth: Payer: Self-pay | Admitting: *Deleted

## 2013-07-12 MED ORDER — LORAZEPAM 1 MG PO TABS: 1.0000 mg | ORAL_TABLET | Freq: Three times a day (TID) | ORAL | Status: DC

## 2013-07-12 NOTE — Telephone Encounter (Signed)
Dorena BodoMary B Dixon, PA-C Elvina MattesSabrina Keyion Knack, CMA            Patient sent me a "My Chart" request for this medicine.  I already e-mailed them back that we would call in this prescription to this pharmacy.  Please call in medicine.   Ativan 1 mg one 3 times daily as needed  Number 90 +0 refills  [facility]  Gilberto Philipps 27-Jan-2040      Med refilled

## 2013-07-14 ENCOUNTER — Encounter: Payer: Self-pay | Admitting: Physician Assistant

## 2013-07-14 MED ORDER — HALOPERIDOL 2 MG PO TABS: 2.0000 mg | ORAL_TABLET | Freq: Two times a day (BID) | ORAL | Status: DC

## 2013-07-19 ENCOUNTER — Ambulatory Visit: Payer: Medicare Other | Admitting: Physician Assistant

## 2013-07-26 ENCOUNTER — Encounter: Payer: Self-pay | Admitting: Physician Assistant

## 2013-07-26 ENCOUNTER — Ambulatory Visit (INDEPENDENT_AMBULATORY_CARE_PROVIDER_SITE_OTHER): Payer: Medicare Other | Admitting: Physician Assistant

## 2013-07-26 VITALS — BP 132/80 | HR 72 | Temp 97.6°F | Resp 18 | Wt 170.0 lb

## 2013-07-26 DIAGNOSIS — D509 Iron deficiency anemia, unspecified: Secondary | ICD-10-CM

## 2013-07-26 DIAGNOSIS — F0391 Unspecified dementia with behavioral disturbance: Secondary | ICD-10-CM

## 2013-07-26 DIAGNOSIS — IMO0002 Reserved for concepts with insufficient information to code with codable children: Secondary | ICD-10-CM

## 2013-07-26 DIAGNOSIS — E1365 Other specified diabetes mellitus with hyperglycemia: Secondary | ICD-10-CM

## 2013-07-26 DIAGNOSIS — J439 Emphysema, unspecified: Secondary | ICD-10-CM

## 2013-07-26 DIAGNOSIS — J438 Other emphysema: Secondary | ICD-10-CM

## 2013-07-26 DIAGNOSIS — I2510 Atherosclerotic heart disease of native coronary artery without angina pectoris: Secondary | ICD-10-CM

## 2013-07-26 DIAGNOSIS — E785 Hyperlipidemia, unspecified: Secondary | ICD-10-CM

## 2013-07-26 DIAGNOSIS — F03918 Unspecified dementia, unspecified severity, with other behavioral disturbance: Secondary | ICD-10-CM | POA: Diagnosis not present

## 2013-07-26 DIAGNOSIS — F17200 Nicotine dependence, unspecified, uncomplicated: Secondary | ICD-10-CM

## 2013-07-26 DIAGNOSIS — K219 Gastro-esophageal reflux disease without esophagitis: Secondary | ICD-10-CM

## 2013-07-26 DIAGNOSIS — E1351 Other specified diabetes mellitus with diabetic peripheral angiopathy without gangrene: Secondary | ICD-10-CM | POA: Diagnosis not present

## 2013-07-26 DIAGNOSIS — I798 Other disorders of arteries, arterioles and capillaries in diseases classified elsewhere: Secondary | ICD-10-CM

## 2013-07-26 DIAGNOSIS — I10 Essential (primary) hypertension: Secondary | ICD-10-CM

## 2013-07-26 DIAGNOSIS — Z9181 History of falling: Secondary | ICD-10-CM

## 2013-07-26 DIAGNOSIS — E538 Deficiency of other specified B group vitamins: Secondary | ICD-10-CM

## 2013-07-26 LAB — HEMOGLOBIN A1C, FINGERSTICK: Hgb A1C (fingerstick): 6.4 % — ABNORMAL HIGH (ref <5.7)

## 2013-07-26 MED ORDER — HALOPERIDOL 2 MG PO TABS: 2.0000 mg | ORAL_TABLET | Freq: Two times a day (BID) | ORAL | Status: DC

## 2013-07-26 MED ORDER — CYANOCOBALAMIN 1000 MCG/ML IJ SOLN
1000.0000 ug | Freq: Once | INTRAMUSCULAR | Status: AC
  Administered 2013-07-26: 1000 ug via INTRAMUSCULAR

## 2013-07-26 NOTE — Progress Notes (Signed)
Patient ID: Jonathan [name] MRN: [id number], DOB: 1940-04-23, 73 y.o. Date of Encounter: @DATE @  Chief Complaint:  Chief Complaint  Patient presents with  . 3 mth check up    HPI: 73 y.o. year old male  presents with his wife for visit today.  I asked her if we can help her with getting further assistance. At prior visits she had reported to me that she has helped care for multiple family members with dementia and is well aware of the support system available. She is also very familiar with the natural progression of dementia.  She states that currently she is paying out of pocket herself for a sitter on Fridays to come be with him while she goes to the grocery store and [place].  The rest of the time she is providing care. Again today, offered to do a referral to neurology. Also offered to try to get her some information regarding any other assistance/support it may be helpful to her. She defers.  Says she doesn't think that they would do anything more than what she is already doing.  She states that he does get agitated and irritated. Says that he wants her to be in his site all the time. Says that he gets very upset and agitated on Fridays when the sister is with him and when she leaves to go to the store.  Says that both Ativan and Ambien make him "wired". Says that he ends up with-- one night of sleeping and the next night being awake. Says that for example this past Tuesday he was up all night. Says that he paced all day Wednesday. Was up wired and pacing. The next day slept. Says he has no patience with anything.  His wife had recently e-mailed me regarding this behavior. I had recommended Haldol. However she says that this was sent into the Mail order pharmacy and she has not yet received it.  Past Medical History  Diagnosis Date  . ED (erectile dysfunction)   . Vitamin D deficiency   . Pancreatitis   . CAD (coronary artery disease)     cath 2001- one vessel occluded  (circ); others patent.  EF 55%  . B12 deficiency 06/02/10    174  . Obesity   . Chronic bronchitis   . Emphysema of lung   . Blood transfusion   . Asthma   . Anemia, iron deficiency 06/02/10    heme negative, ferritin 5  . Diabetes mellitus   . GERD (gastroesophageal reflux disease)   . Hyperlipidemia   . Hypertension   . Nephrolithiasis   . Diverticulosis   . H. pylori infection 05/08/2010  . Hiatal hernia 05/08/2010    EGD  . Gastritis 05/08/2010    EGD  . Smoker   . Dementia 10/19/2012     Home Meds:  Outpatient Prescriptions Prior to Visit  Medication Sig Dispense Refill  . albuterol (VENTOLIN HFA) 108 (90 BASE) MCG/ACT inhaler Inhale 2 puffs into the lungs every 6 (six) hours as needed.        Marland Kitchen amLODipine (NORVASC) 10 MG tablet Take 1 tablet (10 mg total) by mouth daily.  90 tablet  3  . aspirin EC 81 MG tablet Take 81 mg by mouth daily.      Marland Kitchen atorvastatin (LIPITOR) 40 MG tablet Take 40 mg by mouth daily at 6 PM.      . benazepril (LOTENSIN) 20 MG tablet Take 1 tablet (20 mg total) by mouth  daily.  90 tablet  3  . cyanocobalamin (,VITAMIN B-12,) 1000 MCG/ML injection Inject 1 mL (1,000 mcg total) into the muscle every 30 (thirty) days.  1 mL  3  . donepezil (ARICEPT) 5 MG tablet Take 1 tablet (5 mg total) by mouth at bedtime.  90 tablet  2  . iron polysaccharides (NU-IRON) 150 MG capsule Take 1 capsule (150 mg total) by mouth 2 (two) times daily.  180 capsule  3  . losartan (COZAAR) 100 MG tablet Take 1 tablet (100 mg total) by mouth daily.  90 tablet  3  . metFORMIN (GLUCOPHAGE) 1000 MG tablet Take 1 tablet (1,000 mg total) by mouth 2 (two) times daily with a meal.  180 tablet  3  . pioglitazone (ACTOS) 45 MG tablet Take 1 tablet (45 mg total) by mouth daily.  90 tablet  3  . haloperidol (HALDOL) 2 MG tablet Take 1 tablet (2 mg total) by mouth 2 (two) times daily.  30 tablet  0   No facility-administered medications prior to visit.     Allergies: No Known  Allergies  History   Social History  . Marital Status: Married    Spouse Name: N/A    Number of Children: N/A  . Years of Education: N/A   Occupational History  . Not on file.   Social History Main Topics  . Smoking status: Former Smoker    Types: Cigarettes    Quit date: 11/09/2012  . Smokeless tobacco: Never Used  . Alcohol Use: No  . Drug Use: No  . Sexual Activity: No   Other Topics Concern  . Not on file   Social History Narrative  . No narrative on file    Family History  Problem Relation Age of Onset  . Coronary artery disease Mother      Review of Systems:  See HPI for pertinent ROS. All other ROS negative.    Physical Exam: Blood pressure 132/80, pulse 72, temperature 97.6 F (36.4 C), temperature source Oral, resp. rate 18, weight 170 lb (77.111 kg)., Body mass index is 26.62 kg/(m^2). General: WNWD WM. He appears pale today.Appears in no acute distress. Neck: Supple. No thyromegaly. No lymphadenopathy. Lungs: Clear bilaterally to auscultation without wheezes, rales, or rhonchi. Breathing is unlabored. Heart: RRR with S1 S2. No murmurs, rubs, or gallops. Abdomen: Soft, non-tender, non-distended with normoactive bowel sounds. No hepatomegaly. No rebound/guarding. No obvious abdominal masses. Musculoskeletal:  Strength and tone normal for age. Extremities/Skin: Warm and dry. 1+ Bilateral LE edema.  Neuro: Alert.  Moves all extremities spontaneously. Gait is normal. CNII-XII grossly in tact. Psych: His wife provides most of information today sec to pts dementia.     ASSESSMENT AND PLAN:  73 y.o. year old male with  1. Dementia  2. DM (diabetes mellitus), secondary, uncontrolled, with peripheral vascular complications - Hemoglobin A1c  3. Hypertension  4. Hyperlipidemia   5. B12 deficiency - cyanocobalamin ((VITAMIN B-12)) injection 1,000 mcg; Inject 1 mL (1,000 mcg total) into the muscle once.  6. At risk for falling  7. Smoker  8.  Emphysema of lung  9. CAD, NATIVE VESSEL  10. Right carotid bruit  11. Anemia, iron deficiency  12. Other malaise and fatigue  Full Panel of labs was done at last office visit 04/19/13. Today will just check an A1c. Will give B12 injection which is due. Printed prescription for Haldol for them to go ahead and start and try. Told the wife to call me if he  has adverse effects with this. Again today offered referral to neurology or to provide information regarding other assistance for her but she defers.  F/U Office visit 3 months or sooner if needed.    Signed, [address] [place], [name], [facility] 07/26/2013 10:49 AM

## 2013-08-17 ENCOUNTER — Encounter: Payer: Self-pay | Admitting: Physician Assistant

## 2013-08-18 ENCOUNTER — Encounter: Payer: Self-pay | Admitting: Physician Assistant

## 2013-08-18 ENCOUNTER — Ambulatory Visit (INDEPENDENT_AMBULATORY_CARE_PROVIDER_SITE_OTHER): Payer: Medicare Other | Admitting: Physician Assistant

## 2013-08-18 VITALS — BP 104/46 | HR 76 | Temp 97.6°F | Resp 20 | Wt 160.0 lb

## 2013-08-18 DIAGNOSIS — I10 Essential (primary) hypertension: Secondary | ICD-10-CM | POA: Diagnosis not present

## 2013-08-18 DIAGNOSIS — IMO0002 Reserved for concepts with insufficient information to code with codable children: Secondary | ICD-10-CM

## 2013-08-18 DIAGNOSIS — R300 Dysuria: Secondary | ICD-10-CM

## 2013-08-18 DIAGNOSIS — I2510 Atherosclerotic heart disease of native coronary artery without angina pectoris: Secondary | ICD-10-CM | POA: Diagnosis not present

## 2013-08-18 DIAGNOSIS — E1365 Other specified diabetes mellitus with hyperglycemia: Secondary | ICD-10-CM

## 2013-08-18 DIAGNOSIS — I798 Other disorders of arteries, arterioles and capillaries in diseases classified elsewhere: Secondary | ICD-10-CM | POA: Diagnosis not present

## 2013-08-18 DIAGNOSIS — D509 Iron deficiency anemia, unspecified: Secondary | ICD-10-CM | POA: Diagnosis not present

## 2013-08-18 DIAGNOSIS — E785 Hyperlipidemia, unspecified: Secondary | ICD-10-CM

## 2013-08-18 DIAGNOSIS — E1351 Other specified diabetes mellitus with diabetic peripheral angiopathy without gangrene: Secondary | ICD-10-CM | POA: Diagnosis not present

## 2013-08-18 DIAGNOSIS — E538 Deficiency of other specified B group vitamins: Secondary | ICD-10-CM | POA: Diagnosis not present

## 2013-08-18 DIAGNOSIS — F0391 Unspecified dementia with behavioral disturbance: Secondary | ICD-10-CM | POA: Diagnosis not present

## 2013-08-18 DIAGNOSIS — F03918 Unspecified dementia, unspecified severity, with other behavioral disturbance: Secondary | ICD-10-CM | POA: Diagnosis not present

## 2013-08-18 LAB — TSH: TSH: 0.824 u[IU]/mL (ref 0.350–4.500)

## 2013-08-18 LAB — URINALYSIS, ROUTINE W REFLEX MICROSCOPIC
BILIRUBIN URINE: NEGATIVE
GLUCOSE, UA: NEGATIVE mg/dL
Hgb urine dipstick: NEGATIVE
Nitrite: NEGATIVE
Protein, ur: 100 mg/dL — AB
SPECIFIC GRAVITY, URINE: 1.02 (ref 1.005–1.030)
UROBILINOGEN UA: 0.2 mg/dL (ref 0.0–1.0)
pH: 5.5 (ref 5.0–8.0)

## 2013-08-18 LAB — COMPLETE METABOLIC PANEL WITH GFR
ALT: 10 U/L (ref 0–53)
AST: 12 U/L (ref 0–37)
Albumin: 4.2 g/dL (ref 3.5–5.2)
Alkaline Phosphatase: 96 U/L (ref 39–117)
BUN: 28 mg/dL — AB (ref 6–23)
CALCIUM: 9.7 mg/dL (ref 8.4–10.5)
CHLORIDE: 104 meq/L (ref 96–112)
CO2: 26 mEq/L (ref 19–32)
Creat: 1.61 mg/dL — ABNORMAL HIGH (ref 0.50–1.35)
GFR, EST NON AFRICAN AMERICAN: 42 mL/min — AB
GFR, Est African American: 49 mL/min — ABNORMAL LOW
GLUCOSE: 146 mg/dL — AB (ref 70–99)
POTASSIUM: 4.3 meq/L (ref 3.5–5.3)
SODIUM: 142 meq/L (ref 135–145)
TOTAL PROTEIN: 6.8 g/dL (ref 6.0–8.3)
Total Bilirubin: 0.5 mg/dL (ref 0.2–1.2)

## 2013-08-18 LAB — URINALYSIS, MICROSCOPIC ONLY: CRYSTALS: NONE SEEN

## 2013-08-18 LAB — CBC WITH DIFFERENTIAL/PLATELET
Basophils Absolute: 0.1 10*3/uL (ref 0.0–0.1)
Basophils Relative: 1 % (ref 0–1)
Eosinophils Absolute: 0.1 10*3/uL (ref 0.0–0.7)
Eosinophils Relative: 1 % (ref 0–5)
HEMATOCRIT: 41.8 % (ref 39.0–52.0)
HEMOGLOBIN: 14.1 g/dL (ref 13.0–17.0)
LYMPHS ABS: 1.9 10*3/uL (ref 0.7–4.0)
LYMPHS PCT: 23 % (ref 12–46)
MCH: 32.2 pg (ref 26.0–34.0)
MCHC: 33.7 g/dL (ref 30.0–36.0)
MCV: 95.4 fL (ref 78.0–100.0)
MONO ABS: 0.6 10*3/uL (ref 0.1–1.0)
Monocytes Relative: 7 % (ref 3–12)
NEUTROS PCT: 68 % (ref 43–77)
Neutro Abs: 5.7 10*3/uL (ref 1.7–7.7)
Platelets: 436 10*3/uL — ABNORMAL HIGH (ref 150–400)
RBC: 4.38 MIL/uL (ref 4.22–5.81)
RDW: 14.1 % (ref 11.5–15.5)
WBC: 8.4 10*3/uL (ref 4.0–10.5)

## 2013-08-18 LAB — HEMOGLOBIN A1C
HEMOGLOBIN A1C: 6.4 % — AB (ref <5.7)
Mean Plasma Glucose: 137 mg/dL — ABNORMAL HIGH (ref <117)

## 2013-08-18 MED ORDER — LOSARTAN POTASSIUM 50 MG PO TABS: 50.0000 mg | ORAL_TABLET | Freq: Every day | ORAL | Status: DC

## 2013-08-18 NOTE — Addendum Note (Signed)
Addended by: WRAY, [place]ORD[place] on: 08/18/2013 01:09 PM   Modules accepted: Orders

## 2013-08-18 NOTE — Progress Notes (Signed)
Patient ID: Jonathan DemarkDanny P Stencel MRN: [id number], DOB: 1940/10/22, 73 y.o. Date of Encounter: @DATE @  Chief Complaint:  Chief Complaint  Patient presents with  . Sleepy and not eating    HPI: 73 y.o. year old male  presents with his wife for office visit today. He has severe dementia so she does all the talking.  She says that yesterday she sent a "My Chart" message and got an answer from Dr. Tanya NonesPickard.  Her message reported that since Sunday he had been sleeping a lot and having no appetite. Stated that patient was reporting that nothing hurt. Dr. Caren MacadamPickard's response was that dementia can cause these symptoms but also sometimes infections such as UTIs could cause these symptoms and he should be evaluated.  Wife says that prior to Sunday he had been pacing a lot in stating "let's go".  Says that all of a sudden Sunday his behavior changed to where he was sleeping a lot and even further decrease in his food intake.  She says that she is not giving him the Haldol. Says that it caused him to ask strange so she stopped it after just trying it for a few doses.   Past Medical History  Diagnosis Date  . ED (erectile dysfunction)   . Vitamin D deficiency   . Pancreatitis   . CAD (coronary artery disease)     cath 2001- one vessel occluded (circ); others patent.  EF 55%  . B12 deficiency 06/02/10    174  . Obesity   . Chronic bronchitis   . Emphysema of lung   . Blood transfusion   . Asthma   . Anemia, iron deficiency 06/02/10    heme negative, ferritin 5  . Diabetes mellitus   . GERD (gastroesophageal reflux disease)   . Hyperlipidemia   . Hypertension   . Nephrolithiasis   . Diverticulosis   . H. pylori infection 05/08/2010  . Hiatal hernia 05/08/2010    EGD  . Gastritis 05/08/2010    EGD  . Smoker   . Dementia 10/19/2012     Home Meds: Outpatient Prescriptions Prior to Visit  Medication Sig Dispense Refill  . albuterol (VENTOLIN HFA) 108 (90 BASE) MCG/ACT inhaler Inhale 2 puffs  into the lungs every 6 (six) hours as needed.        Marland Kitchen. amLODipine (NORVASC) 10 MG tablet Take 1 tablet (10 mg total) by mouth daily.  90 tablet  3  . aspirin EC 81 MG tablet Take 81 mg by mouth daily.      Marland Kitchen. atorvastatin (LIPITOR) 40 MG tablet Take 40 mg by mouth daily at 6 PM.      . benazepril (LOTENSIN) 20 MG tablet Take 1 tablet (20 mg total) by mouth daily.  90 tablet  3  . cyanocobalamin (,VITAMIN B-12,) 1000 MCG/ML injection Inject 1 mL (1,000 mcg total) into the muscle every 30 (thirty) days.  1 mL  3  . donepezil (ARICEPT) 5 MG tablet Take 1 tablet (5 mg total) by mouth at bedtime.  90 tablet  2  . haloperidol (HALDOL) 2 MG tablet Take 1 tablet (2 mg total) by mouth 2 (two) times daily.  30 tablet  0  . haloperidol (HALDOL) 2 MG tablet Take 1 tablet (2 mg total) by mouth 2 (two) times daily.  30 tablet  0  . iron polysaccharides (NU-IRON) 150 MG capsule Take 1 capsule (150 mg total) by mouth 2 (two) times daily.  180 capsule  3  .  metFORMIN (GLUCOPHAGE) 1000 MG tablet Take 1 tablet (1,000 mg total) by mouth 2 (two) times daily with a meal.  180 tablet  3  . pioglitazone (ACTOS) 45 MG tablet Take 1 tablet (45 mg total) by mouth daily.  90 tablet  3  . losartan (COZAAR) 100 MG tablet Take 1 tablet (100 mg total) by mouth daily.  90 tablet  3   No facility-administered medications prior to visit.    Allergies: No Known Allergies  History   Social History  . Marital Status: Married    Spouse Name: N/A    Number of Children: N/A  . Years of Education: N/A   Occupational History  . Not on file.   Social History Main Topics  . Smoking status: Former Smoker    Types: Cigarettes    Quit date: 11/09/2012  . Smokeless tobacco: Never Used  . Alcohol Use: No  . Drug Use: No  . Sexual Activity: No   Other Topics Concern  . Not on file   Social History Narrative  . No narrative on file    Family History  Problem Relation Age of Onset  . Coronary artery disease Mother       Review of Systems:  See HPI for pertinent ROS. All other ROS negative.    Physical Exam: Blood pressure 104/46, pulse 76, temperature 97.6 F (36.4 C), temperature source Oral, resp. rate 20, weight 160 lb (72.576 kg)., Body mass index is 25.05 kg/(m^2). General: WM. Severe Dementia. Appears in no acute distress. Neck: Supple. No thyromegaly. No lymphadenopathy. Lungs: Clear bilaterally to auscultation without wheezes, rales, or rhonchi. Breathing is unlabored. Heart: RRR with S1 S2. No murmurs, rubs, or gallops. Abdomen: Soft, non-tender, non-distended with normoactive bowel sounds. No hepatomegaly. No rebound/guarding. No obvious abdominal masses. Musculoskeletal:  Strength and tone normal for age. Extremities/Skin: Warm and dry.  Neuro:  Moves all extremities spontaneously.     ASSESSMENT AND PLAN:  73 y.o. year old male with  1. Dysuria - Urinalysis, Routine w reflex microscopic - Urine culture - Urinalysis, Routine w reflex microscopic  2. Essential hypertension Blood pressure is low. We'll decrease his Cozaar from 100 down to 50 mg. - COMPLETE METABOLIC PANEL WITH GFR - losartan (COZAAR) 50 MG tablet; Take 1 tablet (50 mg total) by mouth daily.  Dispense: 90 tablet; Refill: 3  3. Hyperlipidemia - COMPLETE METABOLIC PANEL WITH GFR  4. DM (diabetes mellitus), secondary, uncontrolled, with peripheral vascular complications - COMPLETE METABOLIC PANEL WITH GFR - Hemoglobin A1c  5. B12 deficiency - CBC with Differential  6. Anemia, iron deficiency - CBC with Differential  7. Dementia, with behavioral disturbance - TSH  He is afebrile. He has had no signs or symptoms of any infections. Check labs. If labs are normal, then I think his current symptoms are secondary to advancement of dementia.   [address] [place], [name], [facility] 08/18/2013 12:46 PM

## 2013-08-19 LAB — URINE CULTURE
Colony Count: NO GROWTH
ORGANISM ID, BACTERIA: NO GROWTH

## 2013-08-21 ENCOUNTER — Encounter: Payer: Self-pay | Admitting: *Deleted

## 2013-08-21 NOTE — Telephone Encounter (Signed)
This encounter was created in error - please disregard.

## 2013-08-31 ENCOUNTER — Ambulatory Visit (INDEPENDENT_AMBULATORY_CARE_PROVIDER_SITE_OTHER): Payer: Medicare Other | Admitting: *Deleted

## 2013-08-31 DIAGNOSIS — D518 Other vitamin B12 deficiency anemias: Secondary | ICD-10-CM | POA: Diagnosis not present

## 2013-08-31 DIAGNOSIS — D519 Vitamin B12 deficiency anemia, unspecified: Secondary | ICD-10-CM

## 2013-08-31 MED ORDER — CYANOCOBALAMIN 1000 MCG/ML IJ SOLN
1000.0000 ug | Freq: Once | INTRAMUSCULAR | Status: AC
  Administered 2013-08-31: 1000 ug via INTRAMUSCULAR

## 2013-08-31 NOTE — Patient Instructions (Signed)
Return in 30 days for nurse visit for Vitamin B12 injection.

## 2013-08-31 NOTE — Progress Notes (Signed)
Patient ID: Jonathan [name], male   DOB: 09/16/1940, 73 y.o.   MRN: [id number] Patient seen in office for Vitamin B12 injection.   Tolerated administration well.

## 2013-10-05 ENCOUNTER — Ambulatory Visit (INDEPENDENT_AMBULATORY_CARE_PROVIDER_SITE_OTHER): Payer: Medicare Other | Admitting: Family Medicine

## 2013-10-05 DIAGNOSIS — E538 Deficiency of other specified B group vitamins: Secondary | ICD-10-CM | POA: Diagnosis not present

## 2013-10-05 MED ORDER — CYANOCOBALAMIN 1000 MCG/ML IJ SOLN
1000.0000 ug | Freq: Once | INTRAMUSCULAR | Status: AC
  Administered 2013-10-05: 1000 ug via INTRAMUSCULAR

## 2013-10-08 ENCOUNTER — Inpatient Hospital Stay (HOSPITAL_COMMUNITY): Payer: Medicare Other

## 2013-10-08 ENCOUNTER — Emergency Department (HOSPITAL_COMMUNITY): Payer: Medicare Other

## 2013-10-08 ENCOUNTER — Encounter (HOSPITAL_COMMUNITY): Payer: Self-pay | Admitting: Emergency Medicine

## 2013-10-08 ENCOUNTER — Inpatient Hospital Stay (HOSPITAL_COMMUNITY)
Admission: EM | Admit: 2013-10-08 | Discharge: 2013-10-12 | DRG: 689 | Disposition: A | Discharge status: Skilled Nursing Facility | Payer: Medicare Other | Attending: Internal Medicine | Admitting: Internal Medicine

## 2013-10-08 DIAGNOSIS — F0150 Vascular dementia without behavioral disturbance: Secondary | ICD-10-CM | POA: Diagnosis present

## 2013-10-08 DIAGNOSIS — B9620 Unspecified Escherichia coli [E. coli] as the cause of diseases classified elsewhere: Secondary | ICD-10-CM | POA: Diagnosis present

## 2013-10-08 DIAGNOSIS — E789 Disorder of lipoprotein metabolism, unspecified: Secondary | ICD-10-CM | POA: Diagnosis not present

## 2013-10-08 DIAGNOSIS — IMO0002 Reserved for concepts with insufficient information to code with codable children: Secondary | ICD-10-CM

## 2013-10-08 DIAGNOSIS — N289 Disorder of kidney and ureter, unspecified: Secondary | ICD-10-CM | POA: Diagnosis present

## 2013-10-08 DIAGNOSIS — Z9181 History of falling: Secondary | ICD-10-CM | POA: Diagnosis not present

## 2013-10-08 DIAGNOSIS — I672 Cerebral atherosclerosis: Secondary | ICD-10-CM | POA: Diagnosis present

## 2013-10-08 DIAGNOSIS — F0391 Unspecified dementia with behavioral disturbance: Secondary | ICD-10-CM

## 2013-10-08 DIAGNOSIS — Z23 Encounter for immunization: Secondary | ICD-10-CM

## 2013-10-08 DIAGNOSIS — Z794 Long term (current) use of insulin: Secondary | ICD-10-CM | POA: Diagnosis not present

## 2013-10-08 DIAGNOSIS — N200 Calculus of kidney: Secondary | ICD-10-CM | POA: Diagnosis present

## 2013-10-08 DIAGNOSIS — M25559 Pain in unspecified hip: Secondary | ICD-10-CM | POA: Diagnosis not present

## 2013-10-08 DIAGNOSIS — J449 Chronic obstructive pulmonary disease, unspecified: Secondary | ICD-10-CM | POA: Diagnosis present

## 2013-10-08 DIAGNOSIS — K219 Gastro-esophageal reflux disease without esophagitis: Secondary | ICD-10-CM | POA: Diagnosis present

## 2013-10-08 DIAGNOSIS — S3421XA Injury of nerve root of lumbar spine, initial encounter: Secondary | ICD-10-CM | POA: Diagnosis not present

## 2013-10-08 DIAGNOSIS — Z87442 Personal history of urinary calculi: Secondary | ICD-10-CM | POA: Diagnosis not present

## 2013-10-08 DIAGNOSIS — I2510 Atherosclerotic heart disease of native coronary artery without angina pectoris: Secondary | ICD-10-CM | POA: Diagnosis present

## 2013-10-08 DIAGNOSIS — R2689 Other abnormalities of gait and mobility: Secondary | ICD-10-CM | POA: Diagnosis not present

## 2013-10-08 DIAGNOSIS — M549 Dorsalgia, unspecified: Secondary | ICD-10-CM | POA: Diagnosis not present

## 2013-10-08 DIAGNOSIS — E1351 Other specified diabetes mellitus with diabetic peripheral angiopathy without gangrene: Secondary | ICD-10-CM | POA: Diagnosis present

## 2013-10-08 DIAGNOSIS — K449 Diaphragmatic hernia without obstruction or gangrene: Secondary | ICD-10-CM

## 2013-10-08 DIAGNOSIS — S0990XA Unspecified injury of head, initial encounter: Secondary | ICD-10-CM | POA: Diagnosis not present

## 2013-10-08 DIAGNOSIS — Z87891 Personal history of nicotine dependence: Secondary | ICD-10-CM | POA: Diagnosis not present

## 2013-10-08 DIAGNOSIS — R0989 Other specified symptoms and signs involving the circulatory and respiratory systems: Secondary | ICD-10-CM

## 2013-10-08 DIAGNOSIS — E785 Hyperlipidemia, unspecified: Secondary | ICD-10-CM | POA: Diagnosis present

## 2013-10-08 DIAGNOSIS — E1365 Other specified diabetes mellitus with hyperglycemia: Secondary | ICD-10-CM

## 2013-10-08 DIAGNOSIS — E876 Hypokalemia: Secondary | ICD-10-CM | POA: Diagnosis not present

## 2013-10-08 DIAGNOSIS — Z7982 Long term (current) use of aspirin: Secondary | ICD-10-CM | POA: Diagnosis not present

## 2013-10-08 DIAGNOSIS — K5900 Constipation, unspecified: Secondary | ICD-10-CM | POA: Diagnosis not present

## 2013-10-08 DIAGNOSIS — R269 Unspecified abnormalities of gait and mobility: Secondary | ICD-10-CM | POA: Diagnosis present

## 2013-10-08 DIAGNOSIS — A048 Other specified bacterial intestinal infections: Secondary | ICD-10-CM

## 2013-10-08 DIAGNOSIS — G9340 Encephalopathy, unspecified: Secondary | ICD-10-CM | POA: Diagnosis present

## 2013-10-08 DIAGNOSIS — K2970 Gastritis, unspecified, without bleeding: Secondary | ICD-10-CM

## 2013-10-08 DIAGNOSIS — E538 Deficiency of other specified B group vitamins: Secondary | ICD-10-CM

## 2013-10-08 DIAGNOSIS — D509 Iron deficiency anemia, unspecified: Secondary | ICD-10-CM

## 2013-10-08 DIAGNOSIS — I10 Essential (primary) hypertension: Secondary | ICD-10-CM | POA: Diagnosis present

## 2013-10-08 DIAGNOSIS — F03918 Unspecified dementia, unspecified severity, with other behavioral disturbance: Secondary | ICD-10-CM

## 2013-10-08 DIAGNOSIS — W19XXXA Unspecified fall, initial encounter: Secondary | ICD-10-CM | POA: Diagnosis present

## 2013-10-08 DIAGNOSIS — Z79899 Other long term (current) drug therapy: Secondary | ICD-10-CM | POA: Diagnosis not present

## 2013-10-08 DIAGNOSIS — R531 Weakness: Secondary | ICD-10-CM

## 2013-10-08 DIAGNOSIS — E119 Type 2 diabetes mellitus without complications: Secondary | ICD-10-CM | POA: Diagnosis present

## 2013-10-08 DIAGNOSIS — N390 Urinary tract infection, site not specified: Principal | ICD-10-CM

## 2013-10-08 DIAGNOSIS — Z720 Tobacco use: Secondary | ICD-10-CM | POA: Diagnosis not present

## 2013-10-08 DIAGNOSIS — S199XXA Unspecified injury of neck, initial encounter: Secondary | ICD-10-CM | POA: Diagnosis not present

## 2013-10-08 DIAGNOSIS — R296 Repeated falls: Secondary | ICD-10-CM

## 2013-10-08 DIAGNOSIS — S3993XA Unspecified injury of pelvis, initial encounter: Secondary | ICD-10-CM | POA: Diagnosis not present

## 2013-10-08 DIAGNOSIS — F0390 Unspecified dementia without behavioral disturbance: Secondary | ICD-10-CM

## 2013-10-08 DIAGNOSIS — R319 Hematuria, unspecified: Secondary | ICD-10-CM | POA: Diagnosis not present

## 2013-10-08 DIAGNOSIS — J439 Emphysema, unspecified: Secondary | ICD-10-CM | POA: Diagnosis present

## 2013-10-08 DIAGNOSIS — F17200 Nicotine dependence, unspecified, uncomplicated: Secondary | ICD-10-CM

## 2013-10-08 DIAGNOSIS — R0602 Shortness of breath: Secondary | ICD-10-CM

## 2013-10-08 DIAGNOSIS — Z8249 Family history of ischemic heart disease and other diseases of the circulatory system: Secondary | ICD-10-CM

## 2013-10-08 DIAGNOSIS — J45909 Unspecified asthma, uncomplicated: Secondary | ICD-10-CM | POA: Diagnosis not present

## 2013-10-08 LAB — BASIC METABOLIC PANEL
ANION GAP: 14 (ref 5–15)
BUN: 16 mg/dL (ref 6–23)
CO2: 25 mEq/L (ref 19–32)
CREATININE: 1.13 mg/dL (ref 0.50–1.35)
Calcium: 9.4 mg/dL (ref 8.4–10.5)
Chloride: 104 mEq/L (ref 96–112)
GFR calc Af Amer: 73 mL/min — ABNORMAL LOW (ref >90)
GFR, EST NON AFRICAN AMERICAN: 63 mL/min — AB (ref >90)
Glucose, Bld: 199 mg/dL — ABNORMAL HIGH (ref 70–99)
Potassium: 3.7 mEq/L (ref 3.7–5.3)
Sodium: 143 mEq/L (ref 137–147)

## 2013-10-08 LAB — PROTIME-INR
INR: 0.97 (ref 0.00–1.49)
PROTHROMBIN TIME: 13 s (ref 11.6–15.2)

## 2013-10-08 LAB — CBC WITH DIFFERENTIAL/PLATELET
BASOS ABS: 0 10*3/uL (ref 0.0–0.1)
BASOS PCT: 0 % (ref 0–1)
EOS ABS: 0 10*3/uL (ref 0.0–0.7)
EOS PCT: 0 % (ref 0–5)
HEMATOCRIT: 37.2 % — AB (ref 39.0–52.0)
Hemoglobin: 12.4 g/dL — ABNORMAL LOW (ref 13.0–17.0)
Lymphocytes Relative: 3 % — ABNORMAL LOW (ref 12–46)
Lymphs Abs: 0.5 10*3/uL — ABNORMAL LOW (ref 0.7–4.0)
MCH: 32.5 pg (ref 26.0–34.0)
MCHC: 33.3 g/dL (ref 30.0–36.0)
MCV: 97.6 fL (ref 78.0–100.0)
MONO ABS: 1.2 10*3/uL — AB (ref 0.1–1.0)
MONOS PCT: 7 % (ref 3–12)
Neutro Abs: 15.4 10*3/uL — ABNORMAL HIGH (ref 1.7–7.7)
Neutrophils Relative %: 90 % — ABNORMAL HIGH (ref 43–77)
Platelets: 372 10*3/uL (ref 150–400)
RBC: 3.81 MIL/uL — ABNORMAL LOW (ref 4.22–5.81)
RDW: 13 % (ref 11.5–15.5)
WBC: 17.1 10*3/uL — ABNORMAL HIGH (ref 4.0–10.5)

## 2013-10-08 LAB — CK: CK TOTAL: 1498 U/L — AB (ref 7–232)

## 2013-10-08 LAB — URINALYSIS, ROUTINE W REFLEX MICROSCOPIC
GLUCOSE, UA: 500 mg/dL — AB
Ketones, ur: 15 mg/dL — AB
Nitrite: NEGATIVE
PROTEIN: 100 mg/dL — AB
Specific Gravity, Urine: 1.018 (ref 1.005–1.030)
Urobilinogen, UA: 1 mg/dL (ref 0.0–1.0)
pH: 6 (ref 5.0–8.0)

## 2013-10-08 LAB — APTT: aPTT: 31 seconds (ref 24–37)

## 2013-10-08 LAB — URINE MICROSCOPIC-ADD ON

## 2013-10-08 LAB — GLUCOSE, CAPILLARY: GLUCOSE-CAPILLARY: 126 mg/dL — AB (ref 70–99)

## 2013-10-08 LAB — HEMOGLOBIN A1C
Hgb A1c MFr Bld: 6.4 % — ABNORMAL HIGH (ref <5.7)
MEAN PLASMA GLUCOSE: 137 mg/dL — AB (ref <117)

## 2013-10-08 LAB — MAGNESIUM: MAGNESIUM: 1.4 mg/dL — AB (ref 1.5–2.5)

## 2013-10-08 LAB — POC OCCULT BLOOD, ED: Fecal Occult Bld: NEGATIVE

## 2013-10-08 MED ORDER — SODIUM CHLORIDE 0.9 % IV SOLN
INTRAVENOUS | Status: AC
  Administered 2013-10-08: 21:00:00 via INTRAVENOUS

## 2013-10-08 MED ORDER — SODIUM CHLORIDE 0.9 % IJ SOLN
3.0000 mL | Freq: Two times a day (BID) | INTRAMUSCULAR | Status: DC
  Administered 2013-10-10 – 2013-10-12 (×4): 3 mL via INTRAVENOUS

## 2013-10-08 MED ORDER — SODIUM CHLORIDE 0.9 % IV BOLUS (SEPSIS)
500.0000 mL | Freq: Once | INTRAVENOUS | Status: AC
  Administered 2013-10-08: 500 mL via INTRAVENOUS

## 2013-10-08 MED ORDER — HALOPERIDOL 2 MG PO TABS
2.0000 mg | ORAL_TABLET | Freq: Two times a day (BID) | ORAL | Status: DC
  Filled 2013-10-08: qty 1

## 2013-10-08 MED ORDER — DEXTROSE 5 % IV SOLN
1.0000 g | Freq: Once | INTRAVENOUS | Status: AC
  Administered 2013-10-08: 1 g via INTRAVENOUS
  Filled 2013-10-08: qty 10

## 2013-10-08 MED ORDER — ONDANSETRON HCL 4 MG PO TABS: 4.0000 mg | ORAL_TABLET | Freq: Four times a day (QID) | ORAL | Status: DC | PRN

## 2013-10-08 MED ORDER — ONDANSETRON HCL 4 MG/2ML IJ SOLN: 4.0000 mg | Freq: Four times a day (QID) | INTRAMUSCULAR | Status: DC | PRN

## 2013-10-08 MED ORDER — AMLODIPINE BESYLATE 10 MG PO TABS
10.0000 mg | ORAL_TABLET | Freq: Every day | ORAL | Status: DC
  Administered 2013-10-08 – 2013-10-12 (×5): 10 mg via ORAL
  Filled 2013-10-08 (×5): qty 1

## 2013-10-08 MED ORDER — LORAZEPAM 2 MG/ML IJ SOLN
1.0000 mg | INTRAMUSCULAR | Status: DC | PRN
  Administered 2013-10-08 – 2013-10-11 (×7): 1 mg via INTRAVENOUS
  Filled 2013-10-08 (×7): qty 1

## 2013-10-08 MED ORDER — ACETAMINOPHEN 650 MG RE SUPP: 650.0000 mg | Freq: Four times a day (QID) | RECTAL | Status: DC | PRN

## 2013-10-08 MED ORDER — INSULIN ASPART 100 UNIT/ML ~~LOC~~ SOLN
0.0000 [IU] | Freq: Three times a day (TID) | SUBCUTANEOUS | Status: DC
  Administered 2013-10-08 – 2013-10-09 (×2): 2 [IU] via SUBCUTANEOUS
  Administered 2013-10-09: 1 [IU] via SUBCUTANEOUS
  Administered 2013-10-09: 2 [IU] via SUBCUTANEOUS
  Administered 2013-10-10: 1 [IU] via SUBCUTANEOUS
  Administered 2013-10-10: 2 [IU] via SUBCUTANEOUS
  Administered 2013-10-11 – 2013-10-12 (×4): 1 [IU] via SUBCUTANEOUS

## 2013-10-08 MED ORDER — ATORVASTATIN CALCIUM 40 MG PO TABS
40.0000 mg | ORAL_TABLET | Freq: Every day | ORAL | Status: DC
  Administered 2013-10-08: 40 mg via ORAL
  Filled 2013-10-08 (×2): qty 1

## 2013-10-08 MED ORDER — INFLUENZA VAC SPLIT QUAD 0.5 ML IM SUSY
0.5000 mL | PREFILLED_SYRINGE | INTRAMUSCULAR | Status: AC
  Administered 2013-10-09: 0.5 mL via INTRAMUSCULAR
  Filled 2013-10-08 (×2): qty 0.5

## 2013-10-08 MED ORDER — SODIUM CHLORIDE 0.9 % IV SOLN
Freq: Once | INTRAVENOUS | Status: AC
  Administered 2013-10-08: 100 mL/h via INTRAVENOUS

## 2013-10-08 MED ORDER — ACETAMINOPHEN 325 MG PO TABS: 650.0000 mg | ORAL_TABLET | Freq: Four times a day (QID) | ORAL | Status: DC | PRN

## 2013-10-08 MED ORDER — DONEPEZIL HCL 5 MG PO TABS
5.0000 mg | ORAL_TABLET | Freq: Every day | ORAL | Status: DC
  Administered 2013-10-08 – 2013-10-10 (×3): 5 mg via ORAL
  Filled 2013-10-08 (×5): qty 1

## 2013-10-08 MED ORDER — DEXTROSE 5 % IV SOLN
1.0000 g | INTRAVENOUS | Status: DC
  Administered 2013-10-09 – 2013-10-11 (×3): 1 g via INTRAVENOUS
  Filled 2013-10-08 (×3): qty 10

## 2013-10-08 MED ORDER — POLYSACCHARIDE IRON COMPLEX 150 MG PO CAPS
150.0000 mg | ORAL_CAPSULE | Freq: Two times a day (BID) | ORAL | Status: DC
  Administered 2013-10-08 – 2013-10-12 (×9): 150 mg via ORAL
  Filled 2013-10-08 (×10): qty 1

## 2013-10-08 NOTE — Progress Notes (Signed)
Wife refused haldol to be given to the patient, stated the more pt gets confused  And keeps falling at home.

## 2013-10-08 NOTE — ED Notes (Signed)
Consult at bedside.

## 2013-10-08 NOTE — ED Notes (Signed)
Bed: [id number] Expected date:  Expected time:  Means of arrival:  Comments: EMS-hematuria

## 2013-10-08 NOTE — Progress Notes (Signed)
UR completed 

## 2013-10-08 NOTE — ED Notes (Signed)
Patient taken to radiology Patient in NAD upon leaving ED for testing

## 2013-10-08 NOTE — ED Notes (Signed)
Patient off the floor for imaging Patient in NAD upon leaving for testing

## 2013-10-08 NOTE — ED Notes (Signed)
Patient incontinent of urine Linen changed and diaper placed on patient

## 2013-10-08 NOTE — ED Notes (Signed)
Asked pt if he was able to give a urine specimen he stated he could I gave him a urinal he stated he was able to stand, I let the side side railing down told pt I would step out to give him some privacy he said "ok" then he  got up as I was leaving the room I pulled the curtain and stepped out of the room to provide the pt with privacy

## 2013-10-08 NOTE — ED Notes (Addendum)
Patient arrives via EMS due to c/o "blood in my urine" that started at 0800 Per EMS, patient and family deny previous issue with hematuria Patient with hx of dementia--wife/caretaker is on the way to ED Patient arrived alert and oriented and is in NAD at this time Side rails up, call bell in reach

## 2013-10-08 NOTE — H&P (Addendum)
History and Physical  Jonathan [name] AVW:[id number] DOB: [birth year] DOA: 10/08/2013   PCP: [name], PA-C   Chief Complaint: lethargy and unwitnessed fall and hematuria  HPI:  73 year old male with a history of diabetes mellitus, nephrolithiasis, hypertension, hyperlipidemia, coronary artery disease presented after an unwitnessed fall on the morning of admission. The patient was found lying next to his bed by his wife when she came in from another part of the house. At baseline, the patient uses a cane and is able to get out of bed with minimal to no assistance. However, his wife relates that the patient frequently has mechanical falls. The patient has a history of vascular dementia and is unable to provide any history. All of this history is obtained from speaking with Dr. [name] and review of medical record. Upon finding the patient, the patient's wife noted that there was blood in the bed as well as near the commode where the patient apparently was trying to urinate. Wife states that the patient has had normal bowel movements without hematochezia or melena. Has not been any reports fevers, chills, vomiting, diarrhea, headache, abdominal pain, chest pain, shortness breath, hemoptysis. For the past 24 hours prior to admission, the patient's family has noted some increased lethargy. When the patient's wife tried to assist the patient in getting up today, he was "dead weight". As a result, EMS was activated. In emergency department, the patient was found to have WBC 17.1, hemoglobin 12.4, serum creatinine 1.1.. Remaining metabolic panel was unremarkable. CT of the brain was negative for any acute findings. CT of the cervical spine was negative for any acute findings. Pelvic x-ray was negative for any fractures. The patient was given 500 cc of fluid and started on Rocephin. Assessment/Plan: Acute encephalopathy/lethargy -Likely due to UTI -Continue ceftriaxone pending culture data -Further  workup if no improvement -EKG  Hematuria/Pyuria -Likely due to the patient should return to infection as the patient has significant pyuria -CT abdomen and pelvis without contrast as the patient has a history of nephrolithiasis -May need urology consultation if no improvement with antibiotics -Check INR, PTT -Hold aspirin Mechanical fall/gait instability/Generalized Weakness -PT evaluation -Obtain lumbar x-ray as the patient has question of back pain with right lower leg dragging -check magnesium -check CPK Dementia -Continue Aricept and Haldol -Patient may need a sitter at nighttime as family states he gets quite aggitated Diabetes mellitus, type II -Hemoglobin A1c -NovoLog sliding scale -Discontinue metformin and Actos for now Hypertension -Continue amlodipine -Hold benazepril as the patient has some mild renal insufficiency  Hyperlipidemia  -Continue Lipitor  History of COPD  -Stable       Past Medical History  Diagnosis Date  . ED (erectile dysfunction)   . Vitamin D deficiency   . Pancreatitis   . CAD (coronary artery disease)     cath 2001- one vessel occluded (circ); others patent.  EF 55%  . B12 deficiency 06/02/10    174  . Obesity   . Chronic bronchitis   . Emphysema of lung   . Blood transfusion   . Asthma   . Anemia, iron deficiency 06/02/10    heme negative, ferritin 5  . Diabetes mellitus   . GERD (gastroesophageal reflux disease)   . Hyperlipidemia   . Hypertension   . Nephrolithiasis   . Diverticulosis   . H. pylori infection 05/08/2010  . Hiatal hernia 05/08/2010    EGD  . Gastritis 05/08/2010    EGD  . Smoker   .  Dementia 10/19/2012   Past Surgical History  Procedure Laterality Date  . Knee cartilage surgery    . Cardiac catheterization  01/19/99    showed blockage on"back side"   no stent  . Colonoscopy  2005, 06/07/10    mild diverticulosis  . Upper gastrointestinal endoscopy  06/07/10    2 cm hiatal hernia, mild gastritis   Social  History:  reports that he quit smoking about 10 months ago. His smoking use included Cigarettes. He smoked 0.00 packs per day. He has never used smokeless tobacco. He reports that he does not drink alcohol or use illicit drugs.   Family History  Problem Relation Age of Onset  . Coronary artery disease Mother      No Known Allergies    Prior to Admission medications   Medication Sig Start Date End Date Taking? Authorizing Provider  albuterol (VENTOLIN HFA) 108 (90 BASE) MCG/ACT inhaler Inhale 2 puffs into the lungs every 6 (six) hours as needed.     Yes Historical Provider, MD  amLODipine (NORVASC) 10 MG tablet Take 1 tablet (10 mg total) by mouth daily. 06/07/13  Yes Dorena BodoMary B Dixon, PA-C  aspirin EC 81 MG tablet Take 81 mg by mouth daily.   Yes Historical Provider, MD  atorvastatin (LIPITOR) 40 MG tablet Take 40 mg by mouth daily at 6 PM.   Yes Historical Provider, MD  benazepril (LOTENSIN) 20 MG tablet Take 20 mg by mouth 2 (two) times daily.   Yes Historical Provider, MD  iron polysaccharides (NU-IRON) 150 MG capsule Take 1 capsule (150 mg total) by mouth 2 (two) times daily. 04/19/13  Yes Mary B Dixon, PA-C  losartan (COZAAR) 100 MG tablet Take 50 mg by mouth daily.   Yes Historical Provider, MD  metFORMIN (GLUCOPHAGE) 1000 MG tablet Take 1 tablet (1,000 mg total) by mouth 2 (two) times daily with a meal. 06/11/13  Yes Dorena BodoMary B Dixon, PA-C  pioglitazone (ACTOS) 45 MG tablet Take 1 tablet (45 mg total) by mouth daily. 04/19/13  Yes Mary B Dixon, PA-C  cyanocobalamin (,VITAMIN B-12,) 1000 MCG/ML injection Inject 1 mL (1,000 mcg total) into the muscle every 30 (thirty) days. 02/11/13   Patriciaann ClanMary B Dixon, PA-C  donepezil (ARICEPT) 5 MG tablet Take 1 tablet (5 mg total) by mouth at bedtime. 04/26/13   Patriciaann ClanMary B Dixon, PA-C  haloperidol (HALDOL) 2 MG tablet Take 1 tablet (2 mg total) by mouth 2 (two) times daily. 07/26/13   Patriciaann ClanMary B Dixon, PA-C  haloperidol (HALDOL) 2 MG tablet Take 1 tablet (2 mg total) by mouth 2  (two) times daily. 07/26/13   Dorena BodoMary B Dixon, PA-C    Review of Systems:  Unobtainable secondary to patient's mental status   Physical Exam: Filed Vitals:   10/08/13 1100 10/08/13 1115 10/08/13 1130 10/08/13 1145  BP: 145/57 146/61 145/60 144/61  Pulse: 73 75 72 71  Temp:      TempSrc:      Resp:      SpO2: 95% 94% 95% 93%   General:  Alert and awake, NAD, nontoxic, pleasant/cooperative Head/Eye: No conjunctival hemorrhage, no icterus, [place]/AT, No nystagmus ENT:  No icterus,  No thrush, no pharyngeal exudate Neck:  No masses, no lymphadenpathy, no meningismus CV:  RRR, no rub, no gallop, no S3 Lung:  CTAB, good air movement, no wheeze, no rhonchi Abdomen: soft/NT, +BS, nondistended, no peritoneal signs Ext: No cyanosis, No rashes, No petechiae, No lymphangitis, No edema Neuro: CNII-XII intact, strength 4/5 in bilateral  upper ext.  4/5 LLE, 3-/5 RLE, sensation intact bilat  Labs on Admission:  Basic Metabolic Panel:  Recent Labs Lab 10/08/13 1037  NA 143  K 3.7  CL 104  CO2 25  GLUCOSE 199*  BUN 16  CREATININE 1.13  CALCIUM 9.4   Liver Function Tests: No results found for this basename: AST, ALT, ALKPHOS, BILITOT, PROT, ALBUMIN,  in the last 168 hours No results found for this basename: LIPASE, AMYLASE,  in the last 168 hours No results found for this basename: AMMONIA,  in the last 168 hours CBC:  Recent Labs Lab 10/08/13 1037  WBC 17.1*  NEUTROABS 15.4*  HGB 12.4*  HCT 37.2*  MCV 97.6  PLT 372   Cardiac Enzymes: No results found for this basename: CKTOTAL, CKMB, CKMBINDEX, TROPONINI,  in the last 168 hours BNP: No components found with this basename: POCBNP,  CBG: No results found for this basename: GLUCAP,  in the last 168 hours  Radiological Exams on Admission: Dg Pelvis 1-2 Views  10/08/2013   CLINICAL DATA:  Fall.  All pelvic pain.  EXAM: PELVIS - 1-2 VIEW  COMPARISON:  None.  FINDINGS: There is no evidence of pelvic fracture or diastasis. No pelvic  bone lesions are seen. Atherosclerosis.  IMPRESSION: Negative.   Electronically Signed   By: Andreas Newport M.D.   On: 10/08/2013 11:11   Ct Head Wo Contrast  10/08/2013   CLINICAL DATA:  Dementia, hematuria, fell while in emergency room, frequent falls per family, history asthma, diabetes mellitus, hyperlipidemia, hypertension, coronary artery disease  EXAM: CT HEAD WITHOUT CONTRAST  CT CERVICAL SPINE WITHOUT CONTRAST  TECHNIQUE: Multidetector CT imaging of the head and cervical spine was performed following the standard protocol without intravenous contrast. Multiplanar CT image reconstructions of the cervical spine were also generated.  COMPARISON:  CT head 02/03/2013  FINDINGS: CT HEAD FINDINGS  Generalized atrophy.  Normal ventricular morphology.  No midline shift or mass effect.  Small vessel chronic ischemic changes of deep cerebral white matter.  No intracranial hemorrhage, mass lesion, or acute infarction.  Visualized paranasal sinuses and mastoid air cells clear.  Bones unremarkable.  Atherosclerotic calcifications at the carotid siphons.  CT CERVICAL SPINE FINDINGS  Extensive carotid arterial calcifications.  Prevertebral soft tissues normal thickness.  Bones appear mildly demineralized.  Minimal fluid in a few mastoid air cells, nonspecific.  Skullbase intact.  Mild scattered disc space narrowing with tiny endplate spurs.  Vertebral body heights maintained without fracture or subluxation.  Minor facet degenerative changes at scattered level.  Lung apices clear.  IMPRESSION: Atrophy with small vessel chronic ischemic changes of deep cerebral white matter.  No acute intracranial abnormalities.  No acute cervical spine abnormalities.  Extensive atherosclerotic calcification.   Electronically Signed   By: Ulyses Southward M.D.   On: 10/08/2013 11:07   Ct Cervical Spine Wo Contrast  10/08/2013   CLINICAL DATA:  Dementia, hematuria, fell while in emergency room, frequent falls per family, history asthma,  diabetes mellitus, hyperlipidemia, hypertension, coronary artery disease  EXAM: CT HEAD WITHOUT CONTRAST  CT CERVICAL SPINE WITHOUT CONTRAST  TECHNIQUE: Multidetector CT imaging of the head and cervical spine was performed following the standard protocol without intravenous contrast. Multiplanar CT image reconstructions of the cervical spine were also generated.  COMPARISON:  CT head 02/03/2013  FINDINGS: CT HEAD FINDINGS  Generalized atrophy.  Normal ventricular morphology.  No midline shift or mass effect.  Small vessel chronic ischemic changes of deep cerebral white matter.  No intracranial hemorrhage, mass lesion, or acute infarction.  Visualized paranasal sinuses and mastoid air cells clear.  Bones unremarkable.  Atherosclerotic calcifications at the carotid siphons.  CT CERVICAL SPINE FINDINGS  Extensive carotid arterial calcifications.  Prevertebral soft tissues normal thickness.  Bones appear mildly demineralized.  Minimal fluid in a few mastoid air cells, nonspecific.  Skullbase intact.  Mild scattered disc space narrowing with tiny endplate spurs.  Vertebral body heights maintained without fracture or subluxation.  Minor facet degenerative changes at scattered level.  Lung apices clear.  IMPRESSION: Atrophy with small vessel chronic ischemic changes of deep cerebral white matter.  No acute intracranial abnormalities.  No acute cervical spine abnormalities.  Extensive atherosclerotic calcification.   Electronically Signed   By: Ulyses Southward M.D.   On: 10/08/2013 11:07    EKG: Independently reviewed. pending    Time spent:60 minutes Code Status:   FULL Family Communication:   Family at bedside   Kallon Caylor, DO  [facility] Pager [phone]  If 7PM-7AM, please contact night-coverage [email or web address] Password [email or web address] 10/08/2013, 12:24 PM

## 2013-10-08 NOTE — ED Notes (Signed)
Patient back from both CT and radiology

## 2013-10-08 NOTE — ED Notes (Signed)
Per lab staff, machines went down at the same time but are now back up  Results should be posted 5-10 minutes

## 2013-10-08 NOTE — ED Notes (Signed)
Report called to [phone] Patient to have additional imaging prior to being transported upstairs

## 2013-10-08 NOTE — ED Provider Notes (Signed)
CSN: [id number]     Arrival date & time 10/08/13  [id number] History   First MD Initiated Contact with Patient 10/08/13 [phone]     Chief Complaint  Patient presents with  . Hematuria     (Consider location/radiation/quality/duration/timing/severity/associated sxs/prior Treatment) HPI Comments: 73 year old male with history of vascular dementia, diabetes, reflux, kidney stones, smoker, CAD, lives with his wife at home presents with blood in his underwear. Family feels likely from the urine however unsure. Patient was okay yesterday and then yesterday evening started noticing blood. No history of similar, no history of UTI. Patient had baseline mentally with dementia history. Patient was found this morning on the ground unwitnessed fall/general weakness. On arrival to the ED patient was tiny the urine sample and had another fall from general weakness unwitnessed. Patient normally can stand with assistance however currently generally weaker than normal. No history of GI bleeding issues. Patient on aspirin.  Patient is a 73 y.o. male presenting with hematuria. The history is provided by the patient, the spouse and medical records.  Hematuria    Past Medical History  Diagnosis Date  . ED (erectile dysfunction)   . Vitamin D deficiency   . Pancreatitis   . CAD (coronary artery disease)     cath 2001- one vessel occluded (circ); others patent.  EF 55%  . B12 deficiency 06/02/10    174  . Obesity   . Chronic bronchitis   . Emphysema of lung   . Blood transfusion   . Asthma   . Anemia, iron deficiency 06/02/10    heme negative, ferritin 5  . Diabetes mellitus   . GERD (gastroesophageal reflux disease)   . Hyperlipidemia   . Hypertension   . Nephrolithiasis   . Diverticulosis   . H. pylori infection 05/08/2010  . Hiatal hernia 05/08/2010    EGD  . Gastritis 05/08/2010    EGD  . Smoker   . Dementia 10/19/2012   Past Surgical History  Procedure Laterality Date  . Knee cartilage surgery    .  Cardiac catheterization  01/19/99    showed blockage on"back side"   no stent  . Colonoscopy  2005, 06/07/10    mild diverticulosis  . Upper gastrointestinal endoscopy  06/07/10    2 cm hiatal hernia, mild gastritis   Family History  Problem Relation Age of Onset  . Coronary artery disease Mother    History  Substance Use Topics  . Smoking status: Former Smoker    Types: Cigarettes    Quit date: 11/09/2012  . Smokeless tobacco: Never Used  . Alcohol Use: No    Review of Systems  Genitourinary: Positive for hematuria.  Musculoskeletal: Positive for gait problem.      Allergies  Review of patient's allergies indicates no known allergies.  Home Medications   Prior to Admission medications   Medication Sig Start Date End Date Taking? Authorizing Provider  albuterol (VENTOLIN HFA) 108 (90 BASE) MCG/ACT inhaler Inhale 2 puffs into the lungs every 6 (six) hours as needed.      Historical Provider, MD  amLODipine (NORVASC) 10 MG tablet Take 1 tablet (10 mg total) by mouth daily. 06/07/13   Dorena BodoMary B Dixon, PA-C  aspirin EC 81 MG tablet Take 81 mg by mouth daily.    Historical Provider, MD  atorvastatin (LIPITOR) 40 MG tablet Take 40 mg by mouth daily at 6 PM.    Historical Provider, MD  cyanocobalamin (,VITAMIN B-12,) 1000 MCG/ML injection Inject 1 mL (1,000 mcg total)  into the muscle every 30 (thirty) days. 02/11/13   Patriciaann Clan Dixon, PA-C  donepezil (ARICEPT) 5 MG tablet Take 1 tablet (5 mg total) by mouth at bedtime. 04/26/13   Patriciaann Clan Dixon, PA-C  haloperidol (HALDOL) 2 MG tablet Take 1 tablet (2 mg total) by mouth 2 (two) times daily. 07/26/13   Patriciaann Clan Dixon, PA-C  haloperidol (HALDOL) 2 MG tablet Take 1 tablet (2 mg total) by mouth 2 (two) times daily. 07/26/13   Patriciaann Clan Dixon, PA-C  iron polysaccharides (NU-IRON) 150 MG capsule Take 1 capsule (150 mg total) by mouth 2 (two) times daily. 04/19/13   Dorena Bodo, PA-C  metFORMIN (GLUCOPHAGE) 1000 MG tablet Take 1 tablet (1,000 mg total) by mouth  2 (two) times daily with a meal. 06/11/13   Dorena Bodo, PA-C  pioglitazone (ACTOS) 45 MG tablet Take 1 tablet (45 mg total) by mouth daily. 04/19/13   Mary B Dixon, PA-C   BP 130/53  Pulse 79  Temp(Src) 98.7 F (37.1 C) (Oral)  Resp 24  SpO2 95% Physical Exam  Nursing note and vitals reviewed. Constitutional: He appears well-developed and well-nourished. No distress.  HENT:  Head: Normocephalic and atraumatic.  Eyes: Right eye exhibits no discharge. Left eye exhibits no discharge.  Neck: Normal range of motion. Neck supple. No tracheal deviation present.  Cardiovascular: Normal rate and regular rhythm.   Pulmonary/Chest: Effort normal and breath sounds normal.  Abdominal: Soft. He exhibits no distension. There is no tenderness. There is no guarding.  Genitourinary:  Brown stool, light or colored blood in her underpants. No blood at urethral meatus.  Musculoskeletal: He exhibits no edema.  Neurological: He is alert. GCS eye subscore is 4. GCS verbal subscore is 4. GCS motor subscore is 6.  Pleasant dementia, pupils equal bilateral, horizontal eye movement intact.  Skin: Skin is warm. No rash noted. There is pallor.  Psychiatric:  Pleasant dementia    ED Course  Procedures (including critical care time) Labs Review Labs Reviewed  URINALYSIS, ROUTINE W REFLEX MICROSCOPIC - Abnormal; Notable for the following:    Color, Urine RED (*)    APPearance TURBID (*)    Glucose, UA 500 (*)    Hgb urine dipstick LARGE (*)    Bilirubin Urine MODERATE (*)    Ketones, ur 15 (*)    Protein, ur 100 (*)    Leukocytes, UA LARGE (*)    All other components within normal limits  CBC WITH DIFFERENTIAL - Abnormal; Notable for the following:    WBC 17.1 (*)    RBC 3.81 (*)    Hemoglobin 12.4 (*)    HCT 37.2 (*)    Neutrophils Relative % 90 (*)    Neutro Abs 15.4 (*)    Lymphocytes Relative 3 (*)    Lymphs Abs 0.5 (*)    Monocytes Absolute 1.2 (*)    All other components within normal limits   BASIC METABOLIC PANEL - Abnormal; Notable for the following:    Glucose, Bld 199 (*)    GFR calc non Af Amer 63 (*)    GFR calc Af Amer 73 (*)    All other components within normal limits  URINE MICROSCOPIC-ADD ON - Abnormal; Notable for the following:    Bacteria, UA MANY (*)    All other components within normal limits  HEMOGLOBIN A1C - Abnormal; Notable for the following:    Hemoglobin A1C 6.4 (*)    Mean Plasma Glucose 137 (*)  All other components within normal limits  MAGNESIUM - Abnormal; Notable for the following:    Magnesium 1.4 (*)    All other components within normal limits  CK - Abnormal; Notable for the following:    Total CK 1498 (*)    All other components within normal limits  BASIC METABOLIC PANEL - Abnormal; Notable for the following:    Potassium 3.1 (*)    Glucose, Bld 122 (*)    GFR calc non Af Amer 69 (*)    GFR calc Af Amer 80 (*)    All other components within normal limits  CBC - Abnormal; Notable for the following:    WBC 12.9 (*)    RBC 3.29 (*)    Hemoglobin 10.8 (*)    HCT 32.1 (*)    All other components within normal limits  GLUCOSE, CAPILLARY - Abnormal; Notable for the following:    Glucose-Capillary 126 (*)    All other components within normal limits  GLUCOSE, CAPILLARY - Abnormal; Notable for the following:    Glucose-Capillary 127 (*)    All other components within normal limits  GLUCOSE, CAPILLARY - Abnormal; Notable for the following:    Glucose-Capillary 151 (*)    All other components within normal limits  CK - Abnormal; Notable for the following:    Total CK 794 (*)    All other components within normal limits  URINE CULTURE  PROTIME-INR  APTT  POC OCCULT BLOOD, ED    Imaging Review Dg Lumbar Spine 2-3 Views  10/08/2013   CLINICAL DATA:  Back pain with right leg weakness following a fall ; history of previous knee surgery  EXAM: LUMBAR SPINE - 2-3 VIEW  COMPARISON:  None.  FINDINGS: The lumbar vertebral bodies are  preserved in height. The intervertebral disc space heights are well maintained. The pedicles and transverse processes are intact. There is mild facet joint hypertrophy at L4-5 and L5-S1. The observed portions of the sacrum are unremarkable. There are dense calcifications in the wall of the abdominal aorta and iliac arteries. There is a 6 mm diameter calcification projecting over the lower pole of the right kidney. A 2 mm diameter calcification projects over the mid to upper pole on the right.  IMPRESSION: 1. There is no acute abnormality of the lumbar spine. There is mild degenerative facet joint change at L4-5 and L5-S1. 2. There are calcified kidney stones on the right.   Electronically Signed   By: David  [place]   On: 10/08/2013 13:51   Dg Pelvis 1-2 Views  10/08/2013   CLINICAL DATA:  Fall.  All pelvic pain.  EXAM: PELVIS - 1-2 VIEW  COMPARISON:  None.  FINDINGS: There is no evidence of pelvic fracture or diastasis. No pelvic bone lesions are seen. Atherosclerosis.  IMPRESSION: Negative.   Electronically Signed   By: Andreas Newport M.D.   On: 10/08/2013 11:11   Ct Head Wo Contrast  10/08/2013   CLINICAL DATA:  Dementia, hematuria, fell while in emergency room, frequent falls per family, history asthma, diabetes mellitus, hyperlipidemia, hypertension, coronary artery disease  EXAM: CT HEAD WITHOUT CONTRAST  CT CERVICAL SPINE WITHOUT CONTRAST  TECHNIQUE: Multidetector CT imaging of the head and cervical spine was performed following the standard protocol without intravenous contrast. Multiplanar CT image reconstructions of the cervical spine were also generated.  COMPARISON:  CT head 02/03/2013  FINDINGS: CT HEAD FINDINGS  Generalized atrophy.  Normal ventricular morphology.  No midline shift or mass effect.  Small  vessel chronic ischemic changes of deep cerebral white matter.  No intracranial hemorrhage, mass lesion, or acute infarction.  Visualized paranasal sinuses and mastoid air cells clear.  Bones  unremarkable.  Atherosclerotic calcifications at the carotid siphons.  CT CERVICAL SPINE FINDINGS  Extensive carotid arterial calcifications.  Prevertebral soft tissues normal thickness.  Bones appear mildly demineralized.  Minimal fluid in a few mastoid air cells, nonspecific.  Skullbase intact.  Mild scattered disc space narrowing with tiny endplate spurs.  Vertebral body heights maintained without fracture or subluxation.  Minor facet degenerative changes at scattered level.  Lung apices clear.  IMPRESSION: Atrophy with small vessel chronic ischemic changes of deep cerebral white matter.  No acute intracranial abnormalities.  No acute cervical spine abnormalities.  Extensive atherosclerotic calcification.   Electronically Signed   By: Ulyses Southward M.D.   On: 10/08/2013 11:07   Ct Cervical Spine Wo Contrast  10/08/2013   CLINICAL DATA:  Dementia, hematuria, fell while in emergency room, frequent falls per family, history asthma, diabetes mellitus, hyperlipidemia, hypertension, coronary artery disease  EXAM: CT HEAD WITHOUT CONTRAST  CT CERVICAL SPINE WITHOUT CONTRAST  TECHNIQUE: Multidetector CT imaging of the head and cervical spine was performed following the standard protocol without intravenous contrast. Multiplanar CT image reconstructions of the cervical spine were also generated.  COMPARISON:  CT head 02/03/2013  FINDINGS: CT HEAD FINDINGS  Generalized atrophy.  Normal ventricular morphology.  No midline shift or mass effect.  Small vessel chronic ischemic changes of deep cerebral white matter.  No intracranial hemorrhage, mass lesion, or acute infarction.  Visualized paranasal sinuses and mastoid air cells clear.  Bones unremarkable.  Atherosclerotic calcifications at the carotid siphons.  CT CERVICAL SPINE FINDINGS  Extensive carotid arterial calcifications.  Prevertebral soft tissues normal thickness.  Bones appear mildly demineralized.  Minimal fluid in a few mastoid air cells, nonspecific.  Skullbase  intact.  Mild scattered disc space narrowing with tiny endplate spurs.  Vertebral body heights maintained without fracture or subluxation.  Minor facet degenerative changes at scattered level.  Lung apices clear.  IMPRESSION: Atrophy with small vessel chronic ischemic changes of deep cerebral white matter.  No acute intracranial abnormalities.  No acute cervical spine abnormalities.  Extensive atherosclerotic calcification.   Electronically Signed   By: Ulyses Southward M.D.   On: 10/08/2013 11:07   Ct Renal Stone Study  10/08/2013   CLINICAL DATA:  Hematuria with history of nephrolithiasis. Also history of pancreatitis and diabetes and dementia  EXAM: CT ABDOMEN AND PELVIS WITHOUT CONTRAST  TECHNIQUE: Multidetector CT imaging of the abdomen and pelvis was performed following the standard protocol without IV contrast.  COMPARISON:  Lumbar spine series of today's date  FINDINGS: The kidneys are normal in contour. There is no hydronephrosis. On the right there is a 2 mm upper pole stone. In the lower pole there is an 8 mm stone or group of stones. There is a 1 mm diameter upper pole stone and a 2 mm diameter midpole stone on the left. There are no ureteral stones. The enlarged prostate gland produces a mild impression upon the otherwise normal-appearing urinary bladder.  The liver, gallbladder, pancreas, spleen, nondistended stomach, and adrenal glands are unremarkable. There is atherosclerotic change of the abdominal aorta. There is a focal lateral bulge demonstrated on image 31 where the maximal transverse dimension of the aorta is 2.7 cm. Just above this the maximal dimension is 2.5 cm. There is no evidence of periaortic hemorrhage.  The small and large  bowel exhibit no evidence of ileus nor obstruction. The appendix is normal. There is sigmoid diverticulosis without evidence of acute diverticulitis. There is no ascites. There is no inguinal nor umbilical hernia.  The lung bases are clear. The lumbar spine and bony  pelvis exhibit no acute abnormalities. There is a unilateral pars defect on the right at L5. There is no spondylolisthesis.  IMPRESSION: 1. There are calcified nonobstructing kidney stones bilaterally. There is enlargement of the prostate gland. No acute abnormality of the kidneys or ureters or urinary bladder is demonstrated. 2. There is no acute hepatobiliary nor acute bowel abnormality. 3. There is atherosclerotic change of the abdominal aorta with failure to taper of its caliber but no definite aneurysm.   Electronically Signed   By: David  [place]   On: 10/08/2013 17:53     EKG Interpretation None      MDM   Final diagnoses:  General weakness  Falls frequently  Dementia, without behavioral disturbance  UTI (lower urinary tract infection)   Patient presents with general weakness, fall and likely hematuria. Assisted patient with tach to lift from the floor beside the ER bed, unwitnessed fall as tach left him to give a urine sample and did not realize it dementia. Patient helped to the bed in no focal tenderness or signs of injury from the fall however with unwitnessed fall this morning and in the ED plan for CT head neck and x-ray the pelvis. Blood work and urinalysis pending. Plan for admission for general weakness, hematuria. Abx given for UTI.  The patients results and plan were reviewed and discussed.   Any x-rays performed were personally reviewed by myself.   Differential diagnosis were considered with the presenting HPI.  Medications  amLODipine (NORVASC) tablet 10 mg (10 mg Oral Given 10/09/13 1050)  donepezil (ARICEPT) tablet 5 mg (5 mg Oral Given 10/08/13 2042)  iron polysaccharides (NIFEREX) capsule 150 mg (150 mg Oral Given 10/09/13 1050)  sodium chloride 0.9 % injection 3 mL (3 mLs Intravenous Not Given 10/09/13 1000)  acetaminophen (TYLENOL) tablet 650 mg (not administered)    Or  acetaminophen (TYLENOL) suppository 650 mg (not administered)  ondansetron (ZOFRAN) tablet 4  mg (not administered)    Or  ondansetron (ZOFRAN) injection 4 mg (not administered)  cefTRIAXone (ROCEPHIN) 1 g in dextrose 5 % 50 mL IVPB (1 g Intravenous Given 10/09/13 1306)  insulin aspart (novoLOG) injection 0-9 Units (2 Units Subcutaneous Given 10/09/13 1306)  LORazepam (ATIVAN) injection 1 mg (1 mg Intravenous Given 10/09/13 1457)  sodium chloride 0.9 % 1,000 mL with potassium chloride 20 mEq infusion ( Intravenous New Bag/Given 10/09/13 1452)  magnesium sulfate IVPB 2 g 50 mL (not administered)  sodium chloride 0.9 % bolus 500 mL (0 mLs Intravenous Stopped 10/08/13 1112)  cefTRIAXone (ROCEPHIN) 1 g in dextrose 5 % 50 mL IVPB (0 g Intravenous Stopped 10/08/13 1146)  0.9 %  sodium chloride infusion ( Intravenous Stopped 10/08/13 2053)  0.9 %  sodium chloride infusion ( Intravenous New Bag/Given 10/08/13 2054)  Influenza vac split quadrivalent PF (FLUARIX) injection 0.5 mL (0.5 mLs Intramuscular Given 10/09/13 1053)    Filed Vitals:   10/08/13 2140 10/09/13 0529 10/09/13 1030 10/09/13 1433  BP: 126/64 133/53 114/52 145/68  Pulse: 93 80 71 88  Temp: 99.1 F (37.3 C) 99.1 F (37.3 C) 98.1 F (36.7 C) 98.2 F (36.8 C)  TempSrc: Oral Axillary Axillary Axillary  Resp: 20 20 28 24   Height:      Weight:  158 lb 4.6 oz (71.8 kg)    SpO2: 94% 93% 93% 95%    Final diagnoses:  General weakness  Falls frequently  Dementia, without behavioral disturbance  UTI (lower urinary tract infection)    Admission/ observation were discussed with the admitting physician, patient and/or family and they are comfortable with the plan.      Enid Skeens, MD 10/09/13 [phone]

## 2013-10-09 DIAGNOSIS — N390 Urinary tract infection, site not specified: Secondary | ICD-10-CM | POA: Diagnosis not present

## 2013-10-09 DIAGNOSIS — F03918 Unspecified dementia, unspecified severity, with other behavioral disturbance: Secondary | ICD-10-CM

## 2013-10-09 DIAGNOSIS — F0391 Unspecified dementia with behavioral disturbance: Secondary | ICD-10-CM

## 2013-10-09 DIAGNOSIS — E876 Hypokalemia: Secondary | ICD-10-CM

## 2013-10-09 LAB — CBC
HCT: 32.1 % — ABNORMAL LOW (ref 39.0–52.0)
Hemoglobin: 10.8 g/dL — ABNORMAL LOW (ref 13.0–17.0)
MCH: 32.8 pg (ref 26.0–34.0)
MCHC: 33.6 g/dL (ref 30.0–36.0)
MCV: 97.6 fL (ref 78.0–100.0)
PLATELETS: 329 10*3/uL (ref 150–400)
RBC: 3.29 MIL/uL — ABNORMAL LOW (ref 4.22–5.81)
RDW: 13.3 % (ref 11.5–15.5)
WBC: 12.9 10*3/uL — ABNORMAL HIGH (ref 4.0–10.5)

## 2013-10-09 LAB — BASIC METABOLIC PANEL
Anion gap: 13 (ref 5–15)
BUN: 13 mg/dL (ref 6–23)
CO2: 25 mEq/L (ref 19–32)
CREATININE: 1.05 mg/dL (ref 0.50–1.35)
Calcium: 8.4 mg/dL (ref 8.4–10.5)
Chloride: 104 mEq/L (ref 96–112)
GFR, EST AFRICAN AMERICAN: 80 mL/min — AB (ref >90)
GFR, EST NON AFRICAN AMERICAN: 69 mL/min — AB (ref >90)
Glucose, Bld: 122 mg/dL — ABNORMAL HIGH (ref 70–99)
Potassium: 3.1 mEq/L — ABNORMAL LOW (ref 3.7–5.3)
Sodium: 142 mEq/L (ref 137–147)

## 2013-10-09 LAB — GLUCOSE, CAPILLARY
GLUCOSE-CAPILLARY: 194 mg/dL — AB (ref 70–99)
Glucose-Capillary: 127 mg/dL — ABNORMAL HIGH (ref 70–99)
Glucose-Capillary: 151 mg/dL — ABNORMAL HIGH (ref 70–99)
Glucose-Capillary: 99 mg/dL (ref 70–99)

## 2013-10-09 LAB — CK: CK TOTAL: 794 U/L — AB (ref 7–232)

## 2013-10-09 MED ORDER — MAGNESIUM SULFATE 40 MG/ML IJ SOLN
2.0000 g | Freq: Once | INTRAMUSCULAR | Status: AC
  Administered 2013-10-09: 2 g via INTRAVENOUS
  Filled 2013-10-09: qty 50

## 2013-10-09 MED ORDER — POTASSIUM CHLORIDE 2 MEQ/ML IV SOLN
INTRAVENOUS | Status: DC
  Administered 2013-10-09 – 2013-10-10 (×2): via INTRAVENOUS
  Filled 2013-10-09 (×3): qty 1000

## 2013-10-09 NOTE — Evaluation (Signed)
Physical Therapy Evaluation Patient Details Name: Jonathan DemarkDanny P Perreira MRN: [id number] DOB: 1940-09-17 Today's Date: 10/09/2013   History of Present Illness  73 yo male admitted with UTI, fall. Hx of multiple falls, dementia, DM, HTN.   Clinical Impression  On eval, pt required Mod assist +2 for mobility-stood x2 at EOB with 2HHA. Pt was lethargic likely due to meds (ativan given ~1 hour prior to session). Family present and state plan is likely for SNF. Recommend SNF.     Follow Up Recommendations SNF;Supervision/Assistance - 24 hour    Equipment Recommendations  None recommended by PT    Recommendations for Other Services       Precautions / Restrictions Precautions Precautions: Fall Restrictions Weight Bearing Restrictions: No      Mobility  Bed Mobility Overal bed mobility: Needs Assistance Bed Mobility: Supine to Sit;Sit to Supine     Supine to sit: Mod assist;+2 for physical assistance;HOB elevated Sit to supine: Mod assist;+2 for physical assistance;HOB elevated   General bed mobility comments: assist for trunk and bil LEs. Multimodal cueing needed.   Transfers Overall transfer level: Needs assistance Equipment used: 2 person hand held assist Transfers: Sit to/from Stand Sit to Stand: Mod assist;+2 physical assistance;+2 safety/equipment;From elevated surface         General transfer comment: assist to rise, stabilize, control descent. Multimodal cues for safety, technique  Ambulation/Gait             General Gait Details: unable to attempt due to lethargy likely from meds (ativan given ~1 hour prior to session)  Stairs            Wheelchair Mobility    Modified Rankin (Stroke Patients Only)       Balance Overall balance assessment: Needs assistance;History of Falls Sitting-balance support: Bilateral upper extremity supported;Feet supported Sitting balance-Leahy Scale: Poor Sitting balance - Comments: LOB to L side repeatedly. Overall  required Min-Mod assist for static sitting balance.                                      Pertinent Vitals/Pain Pain Assessment: Faces Faces Pain Scale: No hurt    Home Living Family/patient expects to be discharged to:: Private residence Living Arrangements: Spouse/significant other   Type of Home: House Home Access: Stairs to enter Entrance Stairs-Rails: Right Entrance Stairs-Number of Steps: 3 Home Layout: One level Home Equipment: Walker - 2 wheels;Cane - single point      Prior Function Level of Independence: Independent with assistive device(s)         Comments: using cane primarily for ambulation.      Hand Dominance        Extremity/Trunk Assessment   Upper Extremity Assessment: Generalized weakness           Lower Extremity Assessment: Generalized weakness      Cervical / Trunk Assessment: Kyphotic  Communication   Communication: No difficulties  Cognition Arousal/Alertness: Lethargic;Suspect due to medications Behavior During Therapy: Flat affect Overall Cognitive Status: History of cognitive impairments - at baseline                      General Comments      Exercises        Assessment/Plan    PT Assessment Patient needs continued PT services  PT Diagnosis Difficulty walking;Generalized weakness;Altered mental status   PT Problem List Decreased strength;Decreased activity tolerance;Decreased balance;Decreased  mobility;Decreased cognition;Decreased safety awareness;Decreased knowledge of use of DME  PT Treatment Interventions DME instruction;Gait training;Functional mobility training;Therapeutic activities;Therapeutic exercise;Patient/family education;Balance training   PT Goals (Current goals can be found in the Care Plan section) Acute Rehab PT Goals Patient Stated Goal: none stated by pt. family is considering snf placement PT Goal Formulation: With family Time For Goal Achievement: 10/23/13 Potential to  Achieve Goals: Fair    Frequency Min 3X/week   Barriers to discharge        Co-evaluation               End of Session Equipment Utilized During Treatment: Gait belt Activity Tolerance: Patient limited by lethargy Patient left: in bed;with call bell/phone within reach;with nursing/sitter in room;with family/visitor present           Time: 1700-1721 PT Time Calculation (min): 21 min   Charges:   PT Evaluation $Initial PT Evaluation Tier I: 1 Procedure PT Treatments $Therapeutic Activity: 8-22 mins   PT G Codes:          Rebeca Alert, MPT Pager: [phone]

## 2013-10-09 NOTE — Progress Notes (Signed)
PROGRESS NOTE  Jonathan [name]:[id number] DOB: June 08, 1940 DOA: 10/08/2013 PCP: [name] BETH, PA-C  Assessment/Plan: Acute encephalopathy/lethargy  -Likely due to UTI  -Continue ceftriaxone pending culture data  -Family states pt is close to baseline today -EKG  Hematuria/Pyuria  -Likely due to the infection as the patient has significant pyuria  -CT abdomen and pelvis--bilateral parenchymal nephrolithiasis without ureteral stones or hydronephrosis -May need urology consultation if no improvement with antibiotics  -Check INR, PTT--WNL  -Hold aspirin  Mechanical fall/gait instability/Generalized Weakness  -PT evaluation  -Obtain lumbar x-ray--neg -check magnesium--1.4 -check [id number] Hypomagnesemia -Replete Elevated CPK -Secondary to patient's recent falls -Discontinue Lipitor -Check serial CPKs -IV fluids Dementia  -Continue Aricept and Haldol  -Patient may need a sitter at nighttime as family states he gets quite aggitated  Diabetes mellitus, type II  -Hemoglobin A1c--6.4  -NovoLog sliding scale  -Discontinue metformin and Actos for now  Hypertension  -Continue amlodipine  -Hold benazepril as the patient has some mild renal insufficiency  -BP remains stable Hyperlipidemia  -d/c Lipitor due to elevated CPK History of COPD  -Stable    Family Communication:  Wife and son  updated at beside Disposition Plan:   SNF 10/5     Antibiotics:  Ceftriaxone 10/08/13>>>    Procedures/Studies: Dg Lumbar Spine 2-3 Views  10/08/2013   CLINICAL DATA:  Back pain with right leg weakness following a fall ; history of previous knee surgery  EXAM: LUMBAR SPINE - 2-3 VIEW  COMPARISON:  None.  FINDINGS: The lumbar vertebral bodies are preserved in height. The intervertebral disc space heights are well maintained. The pedicles and transverse processes are intact. There is mild facet joint hypertrophy at L4-5 and L5-S1. The observed portions of the sacrum are  unremarkable. There are dense calcifications in the wall of the abdominal aorta and iliac arteries. There is a 6 mm diameter calcification projecting over the lower pole of the right kidney. A 2 mm diameter calcification projects over the mid to upper pole on the right.  IMPRESSION: 1. There is no acute abnormality of the lumbar spine. There is mild degenerative facet joint change at L4-5 and L5-S1. 2. There are calcified kidney stones on the right.   Electronically Signed   By: Cambree Hendrix  [place]   On: 10/08/2013 13:51   Dg Pelvis 1-2 Views  10/08/2013   CLINICAL DATA:  Fall.  All pelvic pain.  EXAM: PELVIS - 1-2 VIEW  COMPARISON:  None.  FINDINGS: There is no evidence of pelvic fracture or diastasis. No pelvic bone lesions are seen. Atherosclerosis.  IMPRESSION: Negative.   Electronically Signed   By: Andreas Newport M.D.   On: 10/08/2013 11:11   Ct Head Wo Contrast  10/08/2013   CLINICAL DATA:  Dementia, hematuria, fell while in emergency room, frequent falls per family, history asthma, diabetes mellitus, hyperlipidemia, hypertension, coronary artery disease  EXAM: CT HEAD WITHOUT CONTRAST  CT CERVICAL SPINE WITHOUT CONTRAST  TECHNIQUE: Multidetector CT imaging of the head and cervical spine was performed following the standard protocol without intravenous contrast. Multiplanar CT image reconstructions of the cervical spine were also generated.  COMPARISON:  CT head 02/03/2013  FINDINGS: CT HEAD FINDINGS  Generalized atrophy.  Normal ventricular morphology.  No midline shift or mass effect.  Small vessel chronic ischemic changes of deep cerebral white matter.  No intracranial hemorrhage, mass lesion, or acute infarction.  Visualized paranasal sinuses and mastoid air cells clear.  Bones unremarkable.  Atherosclerotic calcifications at the carotid siphons.  CT CERVICAL SPINE FINDINGS  Extensive carotid arterial calcifications.  Prevertebral soft tissues normal thickness.  Bones appear mildly demineralized.   Minimal fluid in a few mastoid air cells, nonspecific.  Skullbase intact.  Mild scattered disc space narrowing with tiny endplate spurs.  Vertebral body heights maintained without fracture or subluxation.  Minor facet degenerative changes at scattered level.  Lung apices clear.  IMPRESSION: Atrophy with small vessel chronic ischemic changes of deep cerebral white matter.  No acute intracranial abnormalities.  No acute cervical spine abnormalities.  Extensive atherosclerotic calcification.   Electronically Signed   By: Ulyses SouthwardMark  Boles M.D.   On: 10/08/2013 11:07   Ct Cervical Spine Wo Contrast  10/08/2013   CLINICAL DATA:  Dementia, hematuria, fell while in emergency room, frequent falls per family, history asthma, diabetes mellitus, hyperlipidemia, hypertension, coronary artery disease  EXAM: CT HEAD WITHOUT CONTRAST  CT CERVICAL SPINE WITHOUT CONTRAST  TECHNIQUE: Multidetector CT imaging of the head and cervical spine was performed following the standard protocol without intravenous contrast. Multiplanar CT image reconstructions of the cervical spine were also generated.  COMPARISON:  CT head 02/03/2013  FINDINGS: CT HEAD FINDINGS  Generalized atrophy.  Normal ventricular morphology.  No midline shift or mass effect.  Small vessel chronic ischemic changes of deep cerebral white matter.  No intracranial hemorrhage, mass lesion, or acute infarction.  Visualized paranasal sinuses and mastoid air cells clear.  Bones unremarkable.  Atherosclerotic calcifications at the carotid siphons.  CT CERVICAL SPINE FINDINGS  Extensive carotid arterial calcifications.  Prevertebral soft tissues normal thickness.  Bones appear mildly demineralized.  Minimal fluid in a few mastoid air cells, nonspecific.  Skullbase intact.  Mild scattered disc space narrowing with tiny endplate spurs.  Vertebral body heights maintained without fracture or subluxation.  Minor facet degenerative changes at scattered level.  Lung apices clear.   IMPRESSION: Atrophy with small vessel chronic ischemic changes of deep cerebral white matter.  No acute intracranial abnormalities.  No acute cervical spine abnormalities.  Extensive atherosclerotic calcification.   Electronically Signed   By: Ulyses SouthwardMark  Boles M.D.   On: 10/08/2013 11:07   Ct Renal Stone Study  10/08/2013   CLINICAL DATA:  Hematuria with history of nephrolithiasis. Also history of pancreatitis and diabetes and dementia  EXAM: CT ABDOMEN AND PELVIS WITHOUT CONTRAST  TECHNIQUE: Multidetector CT imaging of the abdomen and pelvis was performed following the standard protocol without IV contrast.  COMPARISON:  Lumbar spine series of today's date  FINDINGS: The kidneys are normal in contour. There is no hydronephrosis. On the right there is a 2 mm upper pole stone. In the lower pole there is an 8 mm stone or group of stones. There is a 1 mm diameter upper pole stone and a 2 mm diameter midpole stone on the left. There are no ureteral stones. The enlarged prostate gland produces a mild impression upon the otherwise normal-appearing urinary bladder.  The liver, gallbladder, pancreas, spleen, nondistended stomach, and adrenal glands are unremarkable. There is atherosclerotic change of the abdominal aorta. There is a focal lateral bulge demonstrated on image 31 where the maximal transverse dimension of the aorta is 2.7 cm. Just above this the maximal dimension is 2.5 cm. There is no evidence of periaortic hemorrhage.  The small and large bowel exhibit no evidence of ileus nor obstruction. The appendix is normal. There is sigmoid diverticulosis without evidence of acute diverticulitis. There is no ascites. There is no inguinal nor umbilical  hernia.  The lung bases are clear. The lumbar spine and bony pelvis exhibit no acute abnormalities. There is a unilateral pars defect on the right at L5. There is no spondylolisthesis.  IMPRESSION: 1. There are calcified nonobstructing kidney stones bilaterally. There is  enlargement of the prostate gland. No acute abnormality of the kidneys or ureters or urinary bladder is demonstrated. 2. There is no acute hepatobiliary nor acute bowel abnormality. 3. There is atherosclerotic change of the abdominal aorta with failure to taper of its caliber but no definite aneurysm.   Electronically Signed   By: Berlyn Malina  [place]   On: 10/08/2013 17:53         Subjective: Patient is pleasantly confused. Less agitated than yesterday. Denies any fever, chills, chest pain, shortness breath, nausea, vomiting, diarrhea, abdominal pain.  Objective: Filed Vitals:   10/08/13 2140 10/09/13 0529 10/09/13 1030 10/09/13 1433  BP: 126/64 133/53 114/52 145/68  Pulse: 93 80 71 88  Temp: 99.1 F (37.3 C) 99.1 F (37.3 C) 98.1 F (36.7 C) 98.2 F (36.8 C)  TempSrc: Oral Axillary Axillary Axillary  Resp: 20 20 28 24   Height:      Weight:  71.8 kg (158 lb 4.6 oz)    SpO2: 94% 93% 93% 95%    Intake/Output Summary (Last 24 hours) at 10/09/13 1508 Last data filed at 10/09/13 1306  Gross per 24 hour  Intake    770 ml  Output   1300 ml  Net   -530 ml   Weight change:  Exam:   General:  Pt is alert, follows commands appropriately, not in acute distress  HEENT: No icterus, No thrush, [place]/AT  Cardiovascular: RRR, S1/S2, no rubs, no gallops  Respiratory: CTA bilaterally, no wheezing, no crackles, no rhonchi  Abdomen: Soft/+BS, non tender, non distended, no guarding  Extremities: trac edema, No lymphangitis, No petechiae, No rashes, no synovitis  Data Reviewed: Basic Metabolic Panel:  Recent Labs Lab 10/08/13 1037 10/08/13 1503 10/09/13 0413  NA 143  --  142  K 3.7  --  3.1*  CL 104  --  104  CO2 25  --  25  GLUCOSE 199*  --  122*  BUN 16  --  13  CREATININE 1.13  --  1.05  CALCIUM 9.4  --  8.4  MG  --  1.4*  --    Liver Function Tests: No results found for this basename: AST, ALT, ALKPHOS, BILITOT, PROT, ALBUMIN,  in the last 168 hours No results found for  this basename: LIPASE, AMYLASE,  in the last 168 hours No results found for this basename: AMMONIA,  in the last 168 hours CBC:  Recent Labs Lab 10/08/13 1037 10/09/13 0413  WBC 17.1* 12.9*  NEUTROABS 15.4*  --   HGB 12.4* 10.8*  HCT 37.2* 32.1*  MCV 97.6 97.6  PLT 372 329   Cardiac Enzymes:  Recent Labs Lab 10/08/13 1503 10/09/13 1418  CKTOTAL 1498* 794*   BNP: No components found with this basename: POCBNP,  CBG:  Recent Labs Lab 10/08/13 2143 10/09/13 0806 10/09/13 1145  GLUCAP 126* 127* 151*    Recent Results (from the past 240 hour(s))  URINE CULTURE     Status: None   Collection Time    10/08/13 10:53 AM      Result Value Ref Range Status   Specimen Description URINE, RANDOM   Final   Special Requests NONE   Final   Culture  Setup Time  Final   Value: 10/08/2013 14:44     Performed at [facility] Count     Final   Value: >=100,000 COLONIES/ML     Performed at [facility]   Culture     Final   Value: ESCHERICHIA COLI     Performed at [facility]   Report Status PENDING   Incomplete     Scheduled Meds: . amLODipine  10 mg Oral Daily  . cefTRIAXone (ROCEPHIN)  IV  1 g Intravenous Q24H  . donepezil  5 mg Oral QHS  . insulin aspart  0-9 Units Subcutaneous TID WC  . iron polysaccharides  150 mg Oral BID  . sodium chloride  3 mL Intravenous Q12H   Continuous Infusions: . sodium chloride 0.9 % 1,000 mL with potassium chloride 20 mEq infusion 75 mL/hr at 10/09/13 1452     Kaliah Haddaway, DO  [facility] Pager [phone]  If 7PM-7AM, please contact night-coverage [email or web address] Password [email or web address] 10/09/2013, 3:08 PM   LOS: 1 day

## 2013-10-10 LAB — CBC
HEMATOCRIT: 34.4 % — AB (ref 39.0–52.0)
HEMOGLOBIN: 11.5 g/dL — AB (ref 13.0–17.0)
MCH: 32.8 pg (ref 26.0–34.0)
MCHC: 33.4 g/dL (ref 30.0–36.0)
MCV: 98 fL (ref 78.0–100.0)
Platelets: 336 10*3/uL (ref 150–400)
RBC: 3.51 MIL/uL — ABNORMAL LOW (ref 4.22–5.81)
RDW: 13.3 % (ref 11.5–15.5)
WBC: 10.7 10*3/uL — AB (ref 4.0–10.5)

## 2013-10-10 LAB — URINE CULTURE

## 2013-10-10 LAB — BASIC METABOLIC PANEL
ANION GAP: 12 (ref 5–15)
BUN: 12 mg/dL (ref 6–23)
CHLORIDE: 106 meq/L (ref 96–112)
CO2: 25 mEq/L (ref 19–32)
CREATININE: 1.08 mg/dL (ref 0.50–1.35)
Calcium: 8.4 mg/dL (ref 8.4–10.5)
GFR calc non Af Amer: 67 mL/min — ABNORMAL LOW (ref >90)
GFR, EST AFRICAN AMERICAN: 77 mL/min — AB (ref >90)
Glucose, Bld: 116 mg/dL — ABNORMAL HIGH (ref 70–99)
Potassium: 3.4 mEq/L — ABNORMAL LOW (ref 3.7–5.3)
Sodium: 143 mEq/L (ref 137–147)

## 2013-10-10 LAB — GLUCOSE, CAPILLARY
GLUCOSE-CAPILLARY: 177 mg/dL — AB (ref 70–99)
Glucose-Capillary: 115 mg/dL — ABNORMAL HIGH (ref 70–99)
Glucose-Capillary: 136 mg/dL — ABNORMAL HIGH (ref 70–99)
Glucose-Capillary: 163 mg/dL — ABNORMAL HIGH (ref 70–99)

## 2013-10-10 LAB — MAGNESIUM: MAGNESIUM: 2 mg/dL (ref 1.5–2.5)

## 2013-10-10 LAB — CK: Total CK: 418 U/L — ABNORMAL HIGH (ref 7–232)

## 2013-10-10 MED ORDER — SENNA 8.6 MG PO TABS
2.0000 | ORAL_TABLET | Freq: Every day | ORAL | Status: DC
  Administered 2013-10-10 – 2013-10-12 (×3): 17.2 mg via ORAL
  Filled 2013-10-10 (×3): qty 2

## 2013-10-10 MED ORDER — DOCUSATE SODIUM 100 MG PO CAPS
100.0000 mg | ORAL_CAPSULE | Freq: Two times a day (BID) | ORAL | Status: DC
  Administered 2013-10-10 – 2013-10-12 (×3): 100 mg via ORAL
  Filled 2013-10-10 (×5): qty 1

## 2013-10-10 MED ORDER — POTASSIUM CHLORIDE CRYS ER 20 MEQ PO TBCR
20.0000 meq | EXTENDED_RELEASE_TABLET | Freq: Once | ORAL | Status: AC
  Administered 2013-10-10: 20 meq via ORAL
  Filled 2013-10-10: qty 1

## 2013-10-10 NOTE — Discharge Summary (Signed)
Physician Discharge Summary  Jonathan DemarkDanny P [name] ZOX:[id number] DOB: 1940/03/02 DOA: 10/08/2013  PCP: Frazier RichardsIXON,MARY BETH, PA-C  Admit date: 10/08/2013 Discharge date: 10/11/13 Recommendations for Outpatient Follow-up:  1. Pt will need to follow up with PCP in 2 weeks post discharge 2. Please obtain BMP and CBC in one week   Discharge Diagnoses:  Acute encephalopathy/lethargy  -Likely due to UTI  -initially started on ceftriaxone -Family states pt mentation has returned to baseline Hematuria/Pyuria/UTI--EColi  -due to the infection as the patient has significant pyuria  -hematuria has resolved -CT abdomen and pelvis--bilateral parenchymal nephrolithiasis without ureteral stones or hydronephrosis  -Check INR, PTT--WNL  -Hold aspirin  -Hgb stable  -Switch to oral amoxicillin after susceptibilities returned--3 more days to complete 7 days of therapy Mechanical fall/gait instability/Generalized Weakness  -PT evaluation--SNF  -Obtain lumbar x-ray--neg  -check magnesium--1.4-->2.0  -check [id number]-->[id number]-->303  Hypomagnesemia  -Repleted-->2.0  Elevated CPK  -Secondary to patient's recent falls  -Discontinue Lipitor--will remain off lipitor until he follows up with his PCP  -Check serial CPK--1498-->303  -IV fluids  Dementia  -Continue Aricept and Haldol   Diabetes mellitus, type II  -Hemoglobin A1c--6.4  -NovoLog sliding scale  -Discontinue metformin and Actos for now--restart after d/c  Hypertension  -Continue amlodipine  -Hold benazepril as the patient has some mild renal insufficiency  -pt was also on losartan as outpt--this was d/ced -BP remains stable/acceptable Hyperlipidemia  -d/c Lipitor due to elevated CPK  History of COPD  -Stable  Constipation  -Start cathartics  Family Communication: Wife and son updated at beside  Disposition Plan: SNF 10/5  Antibiotics:  Ceftriaxone 10/08/13>>>10/11/13 Amoxicillin 10/11/13>>>  Discharge Condition: stable  Disposition:  SNF  Diet:regular Wt Readings from Last 3 Encounters:  10/09/13 71.8 kg (158 lb 4.6 oz)  08/18/13 72.576 kg (160 lb)  07/26/13 77.111 kg (170 lb)    History of present illness:  73 year old male with a history of diabetes mellitus, nephrolithiasis, hypertension, hyperlipidemia, coronary artery disease presented after an unwitnessed fall on the morning of admission. The patient was found lying next to his bed by his wife when she came in from another part of the house. At baseline, the patient uses a cane and is able to get out of bed with minimal to no assistance. However, his wife relates that the patient frequently has mechanical falls. The patient has a history of vascular dementia and is unable to provide any history. For the past 24 hours prior to admission, the patient's family has noted some increased lethargy. When the patient's wife tried to assist the patient in getting up today, he was "dead weight". In emergency department, the patient was found to have WBC 17.1, hemoglobin 12.4, serum creatinine 1.1.. Remaining metabolic panel was unremarkable. CT of the brain was negative for any acute findings. CT of the cervical spine was negative for any acute findings. Pelvic x-ray was negative for any fractures. The patient was given 500 cc of fluid and started on Rocephin. The patient's CPK was noted to be 1498, likely due to his mechanical falls. The patient will start intravenous fluids. His CPK improved. The patient was continued on Rocephin empirically. His urine culture returned to be Escherichia coli. The patient was subsequently switched to oral therapy. He'll finish 3 additional days oral antibiotics to complete a seven-day course. During the hospitalization, the patient's mentation significantly improved. PT evaluation was ordered. They recommended skilled nursing facility. Social work assistance with placement.      Discharge Exam: Filed Vitals:   10/11/13  0546  BP: 156/75  Pulse:  85  Temp: 97.7 F (36.5 C)  Resp: 20   Filed Vitals:   10/10/13 0627 10/10/13 1404 10/10/13 2110 10/11/13 0546  BP: 132/57 139/60 143/67 156/75  Pulse: 75 71 75 85  Temp: 98.4 F (36.9 C) 98.7 F (37.1 C) 98.2 F (36.8 C) 97.7 F (36.5 C)  TempSrc: Oral Oral Oral Oral  Resp: 20 20 20 20   Height:      Weight:      SpO2: 92% 94% 95% 96%   General: Alert and awake, NAD, pleasant, cooperative Cardiovascular: RRR, no rub, no gallop, no S3 Respiratory: Bibasilar crackles. No wheezing. Good air movement. Abdomen:soft, nontender, nondistended, positive bowel sounds Extremities: trace LE edema, No lymphangitis, no petechiae  Discharge Instructions      Discharge Instructions   Diet - low sodium heart healthy    Complete by:  As directed      Increase activity slowly    Complete by:  As directed             Medication List    STOP taking these medications       benazepril 20 MG tablet  Commonly known as:  LOTENSIN     losartan 100 MG tablet  Commonly known as:  COZAAR      TAKE these medications       amLODipine 10 MG tablet  Commonly known as:  NORVASC  Take 1 tablet (10 mg total) by mouth daily.     amoxicillin 500 MG capsule  Commonly known as:  AMOXIL  Take 1 capsule (500 mg total) by mouth every 8 (eight) hours.     aspirin EC 81 MG tablet  Take 81 mg by mouth daily.     atorvastatin 40 MG tablet  Commonly known as:  LIPITOR  Take 40 mg by mouth daily at 6 PM.     cyanocobalamin 1000 MCG/ML injection  Commonly known as:  (VITAMIN B-12)  Inject 1 mL (1,000 mcg total) into the muscle every 30 (thirty) days.     donepezil 5 MG tablet  Commonly known as:  ARICEPT  Take 1 tablet (5 mg total) by mouth at bedtime.     iron polysaccharides 150 MG capsule  Commonly known as:  NU-IRON  Take 1 capsule (150 mg total) by mouth 2 (two) times daily.     metFORMIN 1000 MG tablet  Commonly known as:  GLUCOPHAGE  Take 1 tablet (1,000 mg total) by mouth 2  (two) times daily with a meal.     pioglitazone 45 MG tablet  Commonly known as:  ACTOS  Take 1 tablet (45 mg total) by mouth daily.     VENTOLIN HFA 108 (90 BASE) MCG/ACT inhaler  Generic drug:  albuterol  Inhale 2 puffs into the lungs every 6 (six) hours as needed.         The results of significant diagnostics from this hospitalization (including imaging, microbiology, ancillary and laboratory) are listed below for reference.    Significant Diagnostic Studies: Dg Lumbar Spine 2-3 Views  10/08/2013   CLINICAL DATA:  Back pain with right leg weakness following a fall ; history of previous knee surgery  EXAM: LUMBAR SPINE - 2-3 VIEW  COMPARISON:  None.  FINDINGS: The lumbar vertebral bodies are preserved in height. The intervertebral disc space heights are well maintained. The pedicles and transverse processes are intact. There is mild facet joint hypertrophy at L4-5 and L5-S1. The observed portions of  the sacrum are unremarkable. There are dense calcifications in the wall of the abdominal aorta and iliac arteries. There is a 6 mm diameter calcification projecting over the lower pole of the right kidney. A 2 mm diameter calcification projects over the mid to upper pole on the right.  IMPRESSION: 1. There is no acute abnormality of the lumbar spine. There is mild degenerative facet joint change at L4-5 and L5-S1. 2. There are calcified kidney stones on the right.   Electronically Signed   By: Suzanne Kho  [place]   On: 10/08/2013 13:51   Dg Pelvis 1-2 Views  10/08/2013   CLINICAL DATA:  Fall.  All pelvic pain.  EXAM: PELVIS - 1-2 VIEW  COMPARISON:  None.  FINDINGS: There is no evidence of pelvic fracture or diastasis. No pelvic bone lesions are seen. Atherosclerosis.  IMPRESSION: Negative.   Electronically Signed   By: Andreas Newport M.D.   On: 10/08/2013 11:11   Ct Head Wo Contrast  10/08/2013   CLINICAL DATA:  Dementia, hematuria, fell while in emergency room, frequent falls per family, history  asthma, diabetes mellitus, hyperlipidemia, hypertension, coronary artery disease  EXAM: CT HEAD WITHOUT CONTRAST  CT CERVICAL SPINE WITHOUT CONTRAST  TECHNIQUE: Multidetector CT imaging of the head and cervical spine was performed following the standard protocol without intravenous contrast. Multiplanar CT image reconstructions of the cervical spine were also generated.  COMPARISON:  CT head 02/03/2013  FINDINGS: CT HEAD FINDINGS  Generalized atrophy.  Normal ventricular morphology.  No midline shift or mass effect.  Small vessel chronic ischemic changes of deep cerebral white matter.  No intracranial hemorrhage, mass lesion, or acute infarction.  Visualized paranasal sinuses and mastoid air cells clear.  Bones unremarkable.  Atherosclerotic calcifications at the carotid siphons.  CT CERVICAL SPINE FINDINGS  Extensive carotid arterial calcifications.  Prevertebral soft tissues normal thickness.  Bones appear mildly demineralized.  Minimal fluid in a few mastoid air cells, nonspecific.  Skullbase intact.  Mild scattered disc space narrowing with tiny endplate spurs.  Vertebral body heights maintained without fracture or subluxation.  Minor facet degenerative changes at scattered level.  Lung apices clear.  IMPRESSION: Atrophy with small vessel chronic ischemic changes of deep cerebral white matter.  No acute intracranial abnormalities.  No acute cervical spine abnormalities.  Extensive atherosclerotic calcification.   Electronically Signed   By: Ulyses Southward M.D.   On: 10/08/2013 11:07   Ct Cervical Spine Wo Contrast  10/08/2013   CLINICAL DATA:  Dementia, hematuria, fell while in emergency room, frequent falls per family, history asthma, diabetes mellitus, hyperlipidemia, hypertension, coronary artery disease  EXAM: CT HEAD WITHOUT CONTRAST  CT CERVICAL SPINE WITHOUT CONTRAST  TECHNIQUE: Multidetector CT imaging of the head and cervical spine was performed following the standard protocol without intravenous  contrast. Multiplanar CT image reconstructions of the cervical spine were also generated.  COMPARISON:  CT head 02/03/2013  FINDINGS: CT HEAD FINDINGS  Generalized atrophy.  Normal ventricular morphology.  No midline shift or mass effect.  Small vessel chronic ischemic changes of deep cerebral white matter.  No intracranial hemorrhage, mass lesion, or acute infarction.  Visualized paranasal sinuses and mastoid air cells clear.  Bones unremarkable.  Atherosclerotic calcifications at the carotid siphons.  CT CERVICAL SPINE FINDINGS  Extensive carotid arterial calcifications.  Prevertebral soft tissues normal thickness.  Bones appear mildly demineralized.  Minimal fluid in a few mastoid air cells, nonspecific.  Skullbase intact.  Mild scattered disc space narrowing with tiny endplate spurs.  Vertebral  body heights maintained without fracture or subluxation.  Minor facet degenerative changes at scattered level.  Lung apices clear.  IMPRESSION: Atrophy with small vessel chronic ischemic changes of deep cerebral white matter.  No acute intracranial abnormalities.  No acute cervical spine abnormalities.  Extensive atherosclerotic calcification.   Electronically Signed   By: Ulyses Southward M.D.   On: 10/08/2013 11:07   Ct Renal Stone Study  10/08/2013   CLINICAL DATA:  Hematuria with history of nephrolithiasis. Also history of pancreatitis and diabetes and dementia  EXAM: CT ABDOMEN AND PELVIS WITHOUT CONTRAST  TECHNIQUE: Multidetector CT imaging of the abdomen and pelvis was performed following the standard protocol without IV contrast.  COMPARISON:  Lumbar spine series of today's date  FINDINGS: The kidneys are normal in contour. There is no hydronephrosis. On the right there is a 2 mm upper pole stone. In the lower pole there is an 8 mm stone or group of stones. There is a 1 mm diameter upper pole stone and a 2 mm diameter midpole stone on the left. There are no ureteral stones. The enlarged prostate gland produces a  mild impression upon the otherwise normal-appearing urinary bladder.  The liver, gallbladder, pancreas, spleen, nondistended stomach, and adrenal glands are unremarkable. There is atherosclerotic change of the abdominal aorta. There is a focal lateral bulge demonstrated on image 31 where the maximal transverse dimension of the aorta is 2.7 cm. Just above this the maximal dimension is 2.5 cm. There is no evidence of periaortic hemorrhage.  The small and large bowel exhibit no evidence of ileus nor obstruction. The appendix is normal. There is sigmoid diverticulosis without evidence of acute diverticulitis. There is no ascites. There is no inguinal nor umbilical hernia.  The lung bases are clear. The lumbar spine and bony pelvis exhibit no acute abnormalities. There is a unilateral pars defect on the right at L5. There is no spondylolisthesis.  IMPRESSION: 1. There are calcified nonobstructing kidney stones bilaterally. There is enlargement of the prostate gland. No acute abnormality of the kidneys or ureters or urinary bladder is demonstrated. 2. There is no acute hepatobiliary nor acute bowel abnormality. 3. There is atherosclerotic change of the abdominal aorta with failure to taper of its caliber but no definite aneurysm.   Electronically Signed   By: Quatisha Zylka  [place]   On: 10/08/2013 17:53     Microbiology: Recent Results (from the past 240 hour(s))  URINE CULTURE     Status: None   Collection Time    10/08/13 10:53 AM      Result Value Ref Range Status   Specimen Description URINE, RANDOM   Final   Special Requests NONE   Final   Culture  Setup Time     Final   Value: 10/08/2013 14:44     Performed at [facility]   Colony Count     Final   Value: >=100,000 COLONIES/ML     Performed at [facility]   Culture     Final   Value: ESCHERICHIA COLI     Performed at [facility]   Report Status 10/10/2013 FINAL   Final   Organism ID, Bacteria ESCHERICHIA COLI   Final      Labs: Basic Metabolic Panel:  Recent Labs Lab 10/08/13 1037 10/08/13 1503 10/09/13 0413 10/10/13 0447 10/11/13 0450  NA 143  --  142 143 140  K 3.7  --  3.1* 3.4* 3.2*  CL 104  --  104 106 102  CO2 25  --  25 25 24   GLUCOSE 199*  --  122* 116* 192*  BUN 16  --  13 12 11   CREATININE 1.13  --  1.05 1.08 0.89  CALCIUM 9.4  --  8.4 8.4 8.7  MG  --  1.4*  --  2.0  --    Liver Function Tests: No results found for this basename: AST, ALT, ALKPHOS, BILITOT, PROT, ALBUMIN,  in the last 168 hours No results found for this basename: LIPASE, AMYLASE,  in the last 168 hours No results found for this basename: AMMONIA,  in the last 168 hours CBC:  Recent Labs Lab 10/08/13 1037 10/09/13 0413 10/10/13 0447 10/11/13 0450  WBC 17.1* 12.9* 10.7* 9.3  NEUTROABS 15.4*  --   --   --   HGB 12.4* 10.8* 11.5* 12.8*  HCT 37.2* 32.1* 34.4* 38.9*  MCV 97.6 97.6 98.0 98.5  PLT 372 329 336 357   Cardiac Enzymes:  Recent Labs Lab 10/08/13 1503 10/09/13 1418 10/10/13 0447 10/11/13 0450  CKTOTAL 1498* 794* 418* 303*   BNP: No components found with this basename: POCBNP,  CBG:  Recent Labs Lab 10/10/13 1139 10/10/13 1642 10/10/13 2151 10/11/13 0738 10/11/13 1236  GLUCAP 177* 136* 163* 150* 130*    Time coordinating discharge:  Greater than 30 minutes  Signed:  Trendon Zaring, DO [facility] Pager: [phone] 10/11/2013, 1:17 PM

## 2013-10-10 NOTE — Progress Notes (Signed)
2:15pm Spoke with Marylu LundJanet from social services regarding speaking with pt's wife about transferring pt to SNF/rehab facility. Pt's wife eager to speak with social services to set up D/C plans for pt. Marylu LundJanet unable to return to pt's room to speak with wife at this time and stated it would either be later today or tomorrow before she could speak with pt's wife. Pt's wife made aware.

## 2013-10-10 NOTE — Clinical Social Work Psychosocial (Signed)
Clinical Social Work Department BRIEF PSYCHOSOCIAL ASSESSMENT 10/10/2013  Patient:  Jonathan [name],Jonathan [name]     Account Number:  [id number]     Admit date:  10/08/2013  Clinical Social Worker:  Robin SearingALDWELL,Grayer Sproles, LCSWA  Date/Time:  10/10/2013 04:01 PM  Referred by:  Physician  Date Referred:  10/10/2013 Referred for  SNF Placement   Other Referral:   Interview type:  Family Other interview type:   spoke with wife at bedside    PSYCHOSOCIAL DATA Living Status:  FAMILY Admitted from facility:   Level of care:   Primary support name:  wife- Jonathan [name] Primary support relationship to patient:  FAMILY Degree of support available:   good    CURRENT CONCERNS Current Concerns  Post-Acute Placement   Other Concerns:    SOCIAL WORK ASSESSMENT / PLAN Referred to CSW for ?SNF- per wife, she is [facility]-  PT is also recommending SNF-  Discussed SNF search process and coverage- she is agreeable to SNF search at this time.   Assessment/plan status:  Other - See comment Other assessment/ plan:   FL2 and PASARR for SNF search   Information/referral to community resources:   SNF list    PATIENT'S/FAMILY'S RESPONSE TO PLAN OF CARE: Patient awake but not engaged nor communicating during my visit- he has vascular dementia and wife is the spokes person at this time. She is agreeable to SNF search and I will proceed with this today-  [facility]-      Reece LevyJanet Dayshawn Irizarry, MSW, [place] [phone]

## 2013-10-10 NOTE — Clinical Social Work Placement (Signed)
Clinical Social Work Department CLINICAL SOCIAL WORK PLACEMENT NOTE 10/10/2013  Patient:  Clent DemarkREDDICK,Jurgen P  Account Number:  [id number] Admit date:  10/08/2013  Clinical Social Worker:  Robin SearingJANET Arthuro Canelo, LCSWA  Date/time:  10/10/2013 04:10 PM  Clinical Social Work is seeking post-discharge placement for this patient at the following level of care:   SKILLED NURSING   (*CSW will update this form in Epic as items are completed)   10/10/2013  Patient/family provided with [facility] Department of Clinical Social Work's list of facilities offering this level of care within the geographic area requested by the patient (or if unable, by the patient's family).  10/10/2013  Patient/family informed of their freedom to choose among providers that offer the needed level of care, that participate in Medicare, Medicaid or managed care program needed by the patient, have an available bed and are willing to accept the patient.  10/10/2013  Patient/family informed of [facility]' ownership interest in [facility], as well as of the fact that they are under no obligation to receive care at this facility.  PASARR submitted to EDS on 10/10/2013 PASARR number received on 10/10/2013  FL2 transmitted to all facilities in geographic area requested by pt/family on  10/10/2013 FL2 transmitted to all facilities within larger geographic area on   Patient informed that his/her managed care company has contracts with or will negotiate with  certain facilities, including the following:     Patient/family informed of bed offers received:   Patient chooses bed at  Physician recommends and patient chooses bed at    Patient to be transferred to  on   Patient to be transferred to facility by  Patient and family notified of transfer on  Name of family member notified:    The following physician request were entered in Epic:   Additional Comments: Reece LevyJanet Ollie Esty, MSW, Theresia MajorsLCSWA [phone] weekend  coverage

## 2013-10-10 NOTE — Progress Notes (Signed)
PROGRESS NOTE  Jonathan [name]:[id number] DOB: September 03, 1940 DOA: 10/08/2013 PCP: [name] BETH, PA-C  Assessment/Plan: Acute encephalopathy/lethargy  -Likely due to UTI  -Continue ceftriaxone pending culture data  -Family states pt is at baseline today  Hematuria/Pyuria/UTI--EColi -Likely due to the infection as the patient has significant pyuria  -CT abdomen and pelvis--bilateral parenchymal nephrolithiasis without ureteral stones or hydronephrosis  -May need urology consultation if no improvement with antibiotics  -Check INR, PTT--WNL  -Hold aspirin  -Hgb stable Mechanical fall/gait instability/Generalized Weakness  -PT evaluation--SNF -Obtain lumbar x-ray--neg  -check magnesium--1.4-->2.0  -check [id number]  Hypomagnesemia  -Repleted-->2.0 Elevated CPK  -Secondary to patient's recent falls  -Discontinue Lipitor  -Check serial CPK--1498-->418  -IV fluids  Dementia  -Continue Aricept and Haldol  -Patient may need a sitter at nighttime as family states he gets quite aggitated  Diabetes mellitus, type II  -Hemoglobin A1c--6.4  -NovoLog sliding scale  -Discontinue metformin and Actos for now  Hypertension  -Continue amlodipine  -Hold benazepril as the patient has some mild renal insufficiency  -BP remains stable  Hyperlipidemia  -d/c Lipitor due to elevated CPK  History of COPD  -Stable  Constipation -Start cathartics Family Communication: Wife and son updated at beside  Disposition Plan: SNF 10/5  Antibiotics:  Ceftriaxone 10/08/13>>>         Procedures/Studies: Dg Lumbar Spine 2-3 Views  10/08/2013   CLINICAL DATA:  Back pain with right leg weakness following a fall ; history of previous knee surgery  EXAM: LUMBAR SPINE - 2-3 VIEW  COMPARISON:  None.  FINDINGS: The lumbar vertebral bodies are preserved in height. The intervertebral disc space heights are well maintained. The pedicles and transverse processes are intact. There is mild facet joint  hypertrophy at L4-5 and L5-S1. The observed portions of the sacrum are unremarkable. There are dense calcifications in the wall of the abdominal aorta and iliac arteries. There is a 6 mm diameter calcification projecting over the lower pole of the right kidney. A 2 mm diameter calcification projects over the mid to upper pole on the right.  IMPRESSION: 1. There is no acute abnormality of the lumbar spine. There is mild degenerative facet joint change at L4-5 and L5-S1. 2. There are calcified kidney stones on the right.   Electronically Signed   By: Evva Din  [place]   On: 10/08/2013 13:51   Dg Pelvis 1-2 Views  10/08/2013   CLINICAL DATA:  Fall.  All pelvic pain.  EXAM: PELVIS - 1-2 VIEW  COMPARISON:  None.  FINDINGS: There is no evidence of pelvic fracture or diastasis. No pelvic bone lesions are seen. Atherosclerosis.  IMPRESSION: Negative.   Electronically Signed   By: Andreas Newport M.D.   On: 10/08/2013 11:11   Ct Head Wo Contrast  10/08/2013   CLINICAL DATA:  Dementia, hematuria, fell while in emergency room, frequent falls per family, history asthma, diabetes mellitus, hyperlipidemia, hypertension, coronary artery disease  EXAM: CT HEAD WITHOUT CONTRAST  CT CERVICAL SPINE WITHOUT CONTRAST  TECHNIQUE: Multidetector CT imaging of the head and cervical spine was performed following the standard protocol without intravenous contrast. Multiplanar CT image reconstructions of the cervical spine were also generated.  COMPARISON:  CT head 02/03/2013  FINDINGS: CT HEAD FINDINGS  Generalized atrophy.  Normal ventricular morphology.  No midline shift or mass effect.  Small vessel chronic ischemic changes of deep cerebral white matter.  No intracranial hemorrhage, mass lesion, or acute infarction.  Visualized paranasal  sinuses and mastoid air cells clear.  Bones unremarkable.  Atherosclerotic calcifications at the carotid siphons.  CT CERVICAL SPINE FINDINGS  Extensive carotid arterial calcifications.  Prevertebral  soft tissues normal thickness.  Bones appear mildly demineralized.  Minimal fluid in a few mastoid air cells, nonspecific.  Skullbase intact.  Mild scattered disc space narrowing with tiny endplate spurs.  Vertebral body heights maintained without fracture or subluxation.  Minor facet degenerative changes at scattered level.  Lung apices clear.  IMPRESSION: Atrophy with small vessel chronic ischemic changes of deep cerebral white matter.  No acute intracranial abnormalities.  No acute cervical spine abnormalities.  Extensive atherosclerotic calcification.   Electronically Signed   By: Ulyses Southward M.D.   On: 10/08/2013 11:07   Ct Cervical Spine Wo Contrast  10/08/2013   CLINICAL DATA:  Dementia, hematuria, fell while in emergency room, frequent falls per family, history asthma, diabetes mellitus, hyperlipidemia, hypertension, coronary artery disease  EXAM: CT HEAD WITHOUT CONTRAST  CT CERVICAL SPINE WITHOUT CONTRAST  TECHNIQUE: Multidetector CT imaging of the head and cervical spine was performed following the standard protocol without intravenous contrast. Multiplanar CT image reconstructions of the cervical spine were also generated.  COMPARISON:  CT head 02/03/2013  FINDINGS: CT HEAD FINDINGS  Generalized atrophy.  Normal ventricular morphology.  No midline shift or mass effect.  Small vessel chronic ischemic changes of deep cerebral white matter.  No intracranial hemorrhage, mass lesion, or acute infarction.  Visualized paranasal sinuses and mastoid air cells clear.  Bones unremarkable.  Atherosclerotic calcifications at the carotid siphons.  CT CERVICAL SPINE FINDINGS  Extensive carotid arterial calcifications.  Prevertebral soft tissues normal thickness.  Bones appear mildly demineralized.  Minimal fluid in a few mastoid air cells, nonspecific.  Skullbase intact.  Mild scattered disc space narrowing with tiny endplate spurs.  Vertebral body heights maintained without fracture or subluxation.  Minor facet  degenerative changes at scattered level.  Lung apices clear.  IMPRESSION: Atrophy with small vessel chronic ischemic changes of deep cerebral white matter.  No acute intracranial abnormalities.  No acute cervical spine abnormalities.  Extensive atherosclerotic calcification.   Electronically Signed   By: Ulyses Southward M.D.   On: 10/08/2013 11:07   Ct Renal Stone Study  10/08/2013   CLINICAL DATA:  Hematuria with history of nephrolithiasis. Also history of pancreatitis and diabetes and dementia  EXAM: CT ABDOMEN AND PELVIS WITHOUT CONTRAST  TECHNIQUE: Multidetector CT imaging of the abdomen and pelvis was performed following the standard protocol without IV contrast.  COMPARISON:  Lumbar spine series of today's date  FINDINGS: The kidneys are normal in contour. There is no hydronephrosis. On the right there is a 2 mm upper pole stone. In the lower pole there is an 8 mm stone or group of stones. There is a 1 mm diameter upper pole stone and a 2 mm diameter midpole stone on the left. There are no ureteral stones. The enlarged prostate gland produces a mild impression upon the otherwise normal-appearing urinary bladder.  The liver, gallbladder, pancreas, spleen, nondistended stomach, and adrenal glands are unremarkable. There is atherosclerotic change of the abdominal aorta. There is a focal lateral bulge demonstrated on image 31 where the maximal transverse dimension of the aorta is 2.7 cm. Just above this the maximal dimension is 2.5 cm. There is no evidence of periaortic hemorrhage.  The small and large bowel exhibit no evidence of ileus nor obstruction. The appendix is normal. There is sigmoid diverticulosis without evidence of acute diverticulitis.  There is no ascites. There is no inguinal nor umbilical hernia.  The lung bases are clear. The lumbar spine and bony pelvis exhibit no acute abnormalities. There is a unilateral pars defect on the right at L5. There is no spondylolisthesis.  IMPRESSION: 1. There are  calcified nonobstructing kidney stones bilaterally. There is enlargement of the prostate gland. No acute abnormality of the kidneys or ureters or urinary bladder is demonstrated. 2. There is no acute hepatobiliary nor acute bowel abnormality. 3. There is atherosclerotic change of the abdominal aorta with failure to taper of its caliber but no definite aneurysm.   Electronically Signed   By: Rejeana Fadness  [place]   On: 10/08/2013 17:53         Subjective: Patient denies fevers, chills, headache, chest pain, dyspnea, nausea, vomiting, diarrhea, abdominal pain, dysuria, hematuria   Objective: Filed Vitals:   10/09/13 1433 10/09/13 2233 10/10/13 0627 10/10/13 1404  BP: 145/68 144/60 132/57 139/60  Pulse: 88 77 75 71  Temp: 98.2 F (36.8 C) 98.2 F (36.8 C) 98.4 F (36.9 C) 98.7 F (37.1 C)  TempSrc: Axillary  Oral Oral  Resp: 24 20 20 20   Height:      Weight:      SpO2: 95% 96% 92% 94%    Intake/Output Summary (Last 24 hours) at 10/10/13 1657 Last data filed at 10/10/13 1500  Gross per 24 hour  Intake   2490 ml  Output    750 ml  Net   1740 ml   Weight change:  Exam:   General:  Pt is alert, follows commands appropriately, not in acute distress  HEENT: No icterus, No thrush,[place]/AT  Cardiovascular: RRR, S1/S2, no rubs, no gallops  Respiratory: Bibasilar crackles No wheezing. good air movement  Abdomen: Soft/+BS, non tender, non distended, no guarding  Extremities: trace LE edema, No lymphangitis, No petechiae, No rashes, no synovitis  Data Reviewed: Basic Metabolic Panel:  Recent Labs Lab 10/08/13 1037 10/08/13 1503 10/09/13 0413 10/10/13 0447  NA 143  --  142 143  K 3.7  --  3.1* 3.4*  CL 104  --  104 106  CO2 25  --  25 25  GLUCOSE 199*  --  122* 116*  BUN 16  --  13 12  CREATININE 1.13  --  1.05 1.08  CALCIUM 9.4  --  8.4 8.4  MG  --  1.4*  --  2.0   Liver Function Tests: No results found for this basename: AST, ALT, ALKPHOS, BILITOT, PROT, ALBUMIN,  in  the last 168 hours No results found for this basename: LIPASE, AMYLASE,  in the last 168 hours No results found for this basename: AMMONIA,  in the last 168 hours CBC:  Recent Labs Lab 10/08/13 1037 10/09/13 0413 10/10/13 0447  WBC 17.1* 12.9* 10.7*  NEUTROABS 15.4*  --   --   HGB 12.4* 10.8* 11.5*  HCT 37.2* 32.1* 34.4*  MCV 97.6 97.6 98.0  PLT 372 329 336   Cardiac Enzymes:  Recent Labs Lab 10/08/13 1503 10/09/13 1418 10/10/13 0447  CKTOTAL 1498* 794* 418*   BNP: No components found with this basename: POCBNP,  CBG:  Recent Labs Lab 10/09/13 1638 10/09/13 2244 10/10/13 0720 10/10/13 1139 10/10/13 1642  GLUCAP 194* 99 115* 177* 136*    Recent Results (from the past 240 hour(s))  URINE CULTURE     Status: None   Collection Time    10/08/13 10:53 AM      Result Value Ref  Range Status   Specimen Description URINE, RANDOM   Final   Special Requests NONE   Final   Culture  Setup Time     Final   Value: 10/08/2013 14:44     Performed at [facility]   Colony Count     Final   Value: >=100,000 COLONIES/ML     Performed at [facility]   Culture     Final   Value: ESCHERICHIA COLI     Performed at [facility]   Report Status 10/10/2013 FINAL   Final   Organism ID, Bacteria ESCHERICHIA COLI   Final     Scheduled Meds: . amLODipine  10 mg Oral Daily  . cefTRIAXone (ROCEPHIN)  IV  1 g Intravenous Q24H  . docusate sodium  100 mg Oral BID  . donepezil  5 mg Oral QHS  . insulin aspart  0-9 Units Subcutaneous TID WC  . iron polysaccharides  150 mg Oral BID  . potassium chloride  20 mEq Oral Once  . senna  2 tablet Oral Daily  . sodium chloride  3 mL Intravenous Q12H   Continuous Infusions: . sodium chloride 0.9 % 1,000 mL with potassium chloride 20 mEq infusion 75 mL/hr at 10/10/13 0441     Makyi Ledo, DO  [facility] Pager [phone]  If 7PM-7AM, please contact night-coverage [email or web address] Password [email or web address] 10/10/2013,  4:57 PM   LOS: 2 days

## 2013-10-11 LAB — CBC
HCT: 38.9 % — ABNORMAL LOW (ref 39.0–52.0)
HEMOGLOBIN: 12.8 g/dL — AB (ref 13.0–17.0)
MCH: 32.4 pg (ref 26.0–34.0)
MCHC: 32.9 g/dL (ref 30.0–36.0)
MCV: 98.5 fL (ref 78.0–100.0)
Platelets: 357 10*3/uL (ref 150–400)
RBC: 3.95 MIL/uL — ABNORMAL LOW (ref 4.22–5.81)
RDW: 12.9 % (ref 11.5–15.5)
WBC: 9.3 10*3/uL (ref 4.0–10.5)

## 2013-10-11 LAB — BASIC METABOLIC PANEL
Anion gap: 14 (ref 5–15)
BUN: 11 mg/dL (ref 6–23)
CO2: 24 mEq/L (ref 19–32)
CREATININE: 0.89 mg/dL (ref 0.50–1.35)
Calcium: 8.7 mg/dL (ref 8.4–10.5)
Chloride: 102 mEq/L (ref 96–112)
GFR calc non Af Amer: 83 mL/min — ABNORMAL LOW (ref >90)
Glucose, Bld: 192 mg/dL — ABNORMAL HIGH (ref 70–99)
Potassium: 3.2 mEq/L — ABNORMAL LOW (ref 3.7–5.3)
Sodium: 140 mEq/L (ref 137–147)

## 2013-10-11 LAB — GLUCOSE, CAPILLARY
GLUCOSE-CAPILLARY: 130 mg/dL — AB (ref 70–99)
Glucose-Capillary: 150 mg/dL — ABNORMAL HIGH (ref 70–99)
Glucose-Capillary: 148 mg/dL — ABNORMAL HIGH (ref 70–99)
Glucose-Capillary: 146 mg/dL — ABNORMAL HIGH (ref 70–99)

## 2013-10-11 LAB — CK: Total CK: 303 U/L — ABNORMAL HIGH (ref 7–232)

## 2013-10-11 MED ORDER — POTASSIUM CHLORIDE CRYS ER 20 MEQ PO TBCR
40.0000 meq | EXTENDED_RELEASE_TABLET | Freq: Once | ORAL | Status: AC
  Administered 2013-10-11: 40 meq via ORAL
  Filled 2013-10-11: qty 2

## 2013-10-11 MED ORDER — AMOXICILLIN 500 MG PO CAPS: 500.0000 mg | ORAL_CAPSULE | Freq: Three times a day (TID) | ORAL | Status: DC

## 2013-10-11 MED ORDER — AMOXICILLIN 500 MG PO CAPS
500.0000 mg | ORAL_CAPSULE | Freq: Three times a day (TID) | ORAL | Status: DC
  Administered 2013-10-11 – 2013-10-12 (×3): 500 mg via ORAL
  Filled 2013-10-11 (×7): qty 1

## 2013-10-11 NOTE — Progress Notes (Signed)
Physical Therapy Treatment Patient Details Name: Jonathan [name] MRN: [id number] DOB: 12/01/1940 Today's Date: 10/11/2013    History of Present Illness 73 yo male admitted with UTI, fall. Hx of multiple falls, dementia, DM, HTN.     PT Comments    Spouse reports pt was up and restless most of the night, then sleeping all this morning.  Pt still groggy/sleepy but arrousable.  Assisted OOB to amb in hallway then positioned in recliner.    Follow Up Recommendations  SNF     Equipment Recommendations       Recommendations for Other Services       Precautions / Restrictions Precautions Precautions: Fall Restrictions Weight Bearing Restrictions: No    Mobility  Bed Mobility Overal bed mobility: Needs Assistance Bed Mobility: Supine to Sit     Supine to sit: Mod assist;+2 for physical assistance;HOB elevated     General bed mobility comments: assist for trunk and bil LEs. Multimodal cueing needed to complete task.  Pt slow and groggy.  Transfers Overall transfer level: Needs assistance Equipment used: Rolling walker (2 wheeled) Transfers: Sit to/from Stand Sit to Stand: Mod assist;+2 physical assistance;+2 safety/equipment;From elevated surface         General transfer comment: assist to rise, stabilize, control descent. Multimodal cues for safety, technique  Ambulation/Gait Ambulation/Gait assistance: +2 safety/equipment;+2 physical assistance;Min assist;Mod assist Ambulation Distance (Feet): 85 Feet Assistive device: Rolling walker (2 wheeled)   Gait velocity: decreased   General Gait Details: slow/groggy/sleepy.  Amb in hallway + 2 asssit for safety then had spouse follow with recliner.  unsteady gait with eyes partially open but verbally responsive.     Stairs            Wheelchair Mobility    Modified Rankin (Stroke Patients Only)       Balance                                    Cognition                            Exercises      General Comments        Pertinent Vitals/Pain      Home Living                      Prior Function            PT Goals (current goals can now be found in the care plan section) Progress towards PT goals: Progressing toward goals    Frequency  Min 3X/week    PT Plan      Co-evaluation             End of Session Equipment Utilized During Treatment: Gait belt Activity Tolerance: Patient limited by fatigue Patient left: in chair;with bed alarm set     Time: 1203-1230 PT Time Calculation (min): 27 min  Charges:  $Gait Training: 8-22 mins $Therapeutic Activity: 8-22 mins                    G Codes:      Jonathan [name]  PTA [facility]  Acute  Rehab Pager      [phone]

## 2013-10-11 NOTE — Progress Notes (Signed)
Chaplain met pt's wife. Pt asleep. Pt's wife stated she was doing fine. Chaplain let her know chaplains are available if need arises.  Jonathan [name] 10/11/2013 10:55 AM

## 2013-10-11 NOTE — Care Management Note (Addendum)
    Page 1 of 1   10/12/2013     1:13:20 PM CARE MANAGEMENT NOTE 10/12/2013  Patient:  Clent DemarkREDDICK,Tailor P   Account Number:  [id number]  Date Initiated:  10/11/2013  Documentation initiated by:  [facility],Zilphia Kozinski  Subjective/Objective Assessment:   73 Y/O M ADMITTED W/UTI.     Action/Plan:   FROM HOME.   Anticipated DC Date:  10/12/2013   Anticipated DC Plan:  SKILLED NURSING FACILITY      DC Planning Services  CM consult      Choice offered to / List presented to:             Status of service:  Completed, signed off Medicare Important Message given?  YES (If response is "NO", the following Medicare IM given date fields will be blank) Date Medicare IM given:  10/11/2013 Medicare IM given by:  [facility],Malayjah Otoole Date Additional Medicare IM given:   Additional Medicare IM given by:    Discharge Disposition:  SKILLED NURSING FACILITY  Per UR Regulation:  Reviewed for med. necessity/level of care/duration of stay  If discussed at Long Length of Stay Meetings, dates discussed:    Comments:  10/12/13 Frady Taddeo RN,BSN NCM [phone] SITTER D/C YESTERDAY OVER 24HRS.D/C SNF TODAY.  10/11/13 Davon Folta RN,BSN NCM [phone] D/C SNF.

## 2013-10-12 DIAGNOSIS — G9340 Encephalopathy, unspecified: Secondary | ICD-10-CM | POA: Diagnosis not present

## 2013-10-12 DIAGNOSIS — F0150 Vascular dementia without behavioral disturbance: Secondary | ICD-10-CM | POA: Diagnosis not present

## 2013-10-12 DIAGNOSIS — J45909 Unspecified asthma, uncomplicated: Secondary | ICD-10-CM | POA: Diagnosis not present

## 2013-10-12 DIAGNOSIS — J449 Chronic obstructive pulmonary disease, unspecified: Secondary | ICD-10-CM | POA: Diagnosis not present

## 2013-10-12 DIAGNOSIS — N200 Calculus of kidney: Secondary | ICD-10-CM | POA: Diagnosis not present

## 2013-10-12 DIAGNOSIS — R2689 Other abnormalities of gait and mobility: Secondary | ICD-10-CM | POA: Diagnosis not present

## 2013-10-12 DIAGNOSIS — I10 Essential (primary) hypertension: Secondary | ICD-10-CM | POA: Diagnosis not present

## 2013-10-12 DIAGNOSIS — N390 Urinary tract infection, site not specified: Secondary | ICD-10-CM | POA: Diagnosis not present

## 2013-10-12 DIAGNOSIS — Z7982 Long term (current) use of aspirin: Secondary | ICD-10-CM | POA: Diagnosis not present

## 2013-10-12 DIAGNOSIS — E1351 Other specified diabetes mellitus with diabetic peripheral angiopathy without gangrene: Secondary | ICD-10-CM | POA: Diagnosis not present

## 2013-10-12 DIAGNOSIS — E789 Disorder of lipoprotein metabolism, unspecified: Secondary | ICD-10-CM | POA: Diagnosis not present

## 2013-10-12 DIAGNOSIS — Z720 Tobacco use: Secondary | ICD-10-CM | POA: Diagnosis not present

## 2013-10-12 DIAGNOSIS — I2510 Atherosclerotic heart disease of native coronary artery without angina pectoris: Secondary | ICD-10-CM | POA: Diagnosis not present

## 2013-10-12 DIAGNOSIS — E119 Type 2 diabetes mellitus without complications: Secondary | ICD-10-CM | POA: Diagnosis not present

## 2013-10-12 DIAGNOSIS — E876 Hypokalemia: Secondary | ICD-10-CM | POA: Diagnosis not present

## 2013-10-12 DIAGNOSIS — Z794 Long term (current) use of insulin: Secondary | ICD-10-CM | POA: Diagnosis not present

## 2013-10-12 DIAGNOSIS — K5900 Constipation, unspecified: Secondary | ICD-10-CM | POA: Diagnosis not present

## 2013-10-12 DIAGNOSIS — Z9181 History of falling: Secondary | ICD-10-CM | POA: Diagnosis not present

## 2013-10-12 DIAGNOSIS — E785 Hyperlipidemia, unspecified: Secondary | ICD-10-CM | POA: Diagnosis not present

## 2013-10-12 DIAGNOSIS — F0390 Unspecified dementia without behavioral disturbance: Secondary | ICD-10-CM | POA: Diagnosis not present

## 2013-10-12 DIAGNOSIS — R296 Repeated falls: Secondary | ICD-10-CM | POA: Diagnosis not present

## 2013-10-12 DIAGNOSIS — R531 Weakness: Secondary | ICD-10-CM | POA: Diagnosis not present

## 2013-10-12 LAB — GLUCOSE, CAPILLARY: Glucose-Capillary: 131 mg/dL — ABNORMAL HIGH (ref 70–99)

## 2013-10-12 MED ORDER — LORAZEPAM 1 MG PO TABS: 1.0000 mg | ORAL_TABLET | Freq: Every evening | ORAL | Status: DC | PRN

## 2013-10-12 NOTE — Progress Notes (Signed)
[facility]ley [facility]. All questions answered.

## 2013-10-12 NOTE — Progress Notes (Signed)
[facility]. Patient and wife at bedside aware. DC packet given to RN, Jonathan [name]. [facility] called for transport.   Clinical Social Work Department CLINICAL SOCIAL WORK PLACEMENT NOTE 10/12/2013  Patient:  Jonathan [name],Jonathan [name]  Account Number:  [id number] Admit date:  10/08/2013  Clinical Social Worker:  Jonathan [name], Jonathan [name]  Date/time:  10/10/2013 04:10 PM  Clinical Social Work is seeking post-discharge placement for this patient at the following level of care:   SKILLED NURSING   (*CSW will update this form in Epic as items are completed)   10/10/2013  Patient/family provided with [facility] Department of Clinical Social Work's list of facilities offering this level of care within the geographic area requested by the patient (or if unable, by the patient's family).  10/10/2013  Patient/family informed of their freedom to choose among providers that offer the needed level of care, that participate in Medicare, Medicaid or managed care program needed by the patient, have an available bed and are willing to accept the patient.  10/10/2013  Patient/family informed of [facility]' ownership interest in [facility], as well as of the fact that they are under no obligation to receive care at this facility.  PASARR submitted to EDS on 10/10/2013 PASARR number received on 10/10/2013  FL2 transmitted to all facilities in geographic area requested by pt/family on  10/10/2013 FL2 transmitted to all facilities within larger geographic area on   Patient informed that his/her managed care company has contracts with or will negotiate with  certain facilities, including the following:     Patient/family informed of bed offers received:  10/11/2013 [facility] Physician recommends and patient chooses bed at    [facility] on  10/12/2013 Patient to be transferred to facility  by [facility] Patient and family notified of transfer on 10/12/2013 Name of family member notified:  Wife at bedside  The following physician request were entered in Epic:   Additional Comments:   Jonathan MaxinKelly Askari Kinley, LCSW [facility] Clinical Social Worker cell #: [phone]

## 2013-10-12 NOTE — Progress Notes (Signed)
[facility] at [facility]. Pt transferred w/ transfer paperwork and scripts. Wife in possession of pt's personal belongings. Pt in stable condition at time of d/c.

## 2013-10-12 NOTE — Discharge Summary (Addendum)
Physician Discharge Summary  WALLICE GRANVILLE WUJ:[id number] DOB: [birth year] DOA: 10/08/2013  PCP: [name], PA-C  Admit date: 10/08/2013 Discharge date: 10/12/13 Recommendations for Outpatient Follow-up:  1. Pt will need to follow up with PCP in 2 weeks post discharge 2. Please obtain BMP and CBC and CPK in one week and restart Lipitor 40 mg q hs if clinically indicated   Discharge Diagnoses:  Acute encephalopathy/lethargy  -Likely due to UTI  -initially started on ceftriaxone -Family states pt mentation has returned to baseline -Ativan 1mg  po qhs prn aggitation worked well Hematuria/Pyuria/UTI--EColi  -due to the infection as the patient has significant pyuria  -hematuria has resolved -CT abdomen and pelvis--bilateral parenchymal nephrolithiasis without ureteral stones or hydronephrosis  -Check INR, PTT--WNL  -Held aspirin during hospitalization which will be restarted after d/c -Hgb stable  -Switch to oral amoxicillin after susceptibilities returned--3 more days to complete 7 days of therapy Mechanical fall/gait instability/Generalized Weakness  -PT evaluation--SNF  -Obtain lumbar x-ray--neg  -check magnesium--1.4-->2.0  -check [id number]-->865  Hypomagnesemia  -Repleted-->2.0  Elevated CPK  -Secondary to patient's recent falls  -Discontinue Lipitor--will remain off lipitor until he follows up with his PCP  -Check serial CPK--1498-->303  -IV fluids  Dementia  -Continue Aricept and Haldol   Diabetes mellitus, type II  -Hemoglobin A1c--6.4  -NovoLog sliding scale  -Discontinue metformin and Actos for now--restart after d/c  Hypertension  -Continue amlodipine  -Hold benazepril as the patient has some mild renal insufficiency  -pt was also on losartan as outpt--this was d/ced -BP remains stable/acceptable Hyperlipidemia  -d/c Lipitor due to elevated CPK -PCP to determine optimal time to restart lipitor  History of COPD  -Stable  Constipation  -Start cathartics    Family Communication: Wife and son updated at beside  Disposition Plan: SNF 10/5  Antibiotics:  Ceftriaxone 10/08/13>>>10/11/13 Amoxicillin 10/11/13>>>  Discharge Condition: stable  Disposition: SNF  Diet:regular Wt Readings from Last 3 Encounters:  10/09/13 71.8 kg (158 lb 4.6 oz)  08/18/13 72.576 kg (160 lb)  07/26/13 77.111 kg (170 lb)    History of present illness:  73 year old male with a history of diabetes mellitus, nephrolithiasis, hypertension, hyperlipidemia, coronary artery disease presented after an unwitnessed fall on the morning of admission. The patient was found lying next to his bed by his wife when she came in from another part of the house. At baseline, the patient uses a cane and is able to get out of bed with minimal to no assistance. However, his wife relates that the patient frequently has mechanical falls. The patient has a history of vascular dementia and is unable to provide any history. For the past 24 hours prior to admission, the patient's family has noted some increased lethargy. When the patient's wife tried to assist the patient in getting up today, he was "dead weight". In emergency department, the patient was found to have WBC 17.1, hemoglobin 12.4, serum creatinine 1.1.. Remaining metabolic panel was unremarkable. CT of the brain was negative for any acute findings. CT of the cervical spine was negative for any acute findings. Pelvic x-ray was negative for any fractures. The patient was given 500 cc of fluid and started on Rocephin. The patient's CPK was noted to be 1498, likely due to his mechanical falls. The patient will start intravenous fluids. His CPK improved. The patient was continued on Rocephin empirically. His urine culture returned to be Escherichia coli. The patient was subsequently switched to oral therapy. He'll finish 3 additional days oral antibiotics to complete a  seven-day course. During the hospitalization, the patient's mentation significantly  improved. PT evaluation was ordered. They recommended skilled nursing facility. Social work assistance with placement.      Discharge Exam: Filed Vitals:   10/12/13 0428  BP: 142/73  Pulse: 72  Temp: 98 F (36.7 C)  Resp: 20   Filed Vitals:   10/11/13 0546 10/11/13 1324 10/11/13 2209 10/12/13 0428  BP: 156/75 134/78 136/57 142/73  Pulse: 85 74 63 72  Temp: 97.7 F (36.5 C) 97.4 F (36.3 C) 98.3 F (36.8 C) 98 F (36.7 C)  TempSrc: Oral Axillary Axillary Oral  Resp: 20 20 20 20   Height:      Weight:      SpO2: 96% 96% 94% 93%   General: Alert and awake, NAD, pleasant, cooperative Cardiovascular: RRR, no rub, no gallop, no S3 Respiratory: Bibasilar crackles. No wheezing. Good air movement. Abdomen:soft, nontender, nondistended, positive bowel sounds Extremities: trace LE edema, No lymphangitis, no petechiae  Discharge Instructions      Discharge Instructions   Diet - low sodium heart healthy    Complete by:  As directed      Increase activity slowly    Complete by:  As directed             Medication List    STOP taking these medications       atorvastatin 40 MG tablet  Commonly known as:  LIPITOR     benazepril 20 MG tablet  Commonly known as:  LOTENSIN     losartan 100 MG tablet  Commonly known as:  COZAAR      TAKE these medications       amLODipine 10 MG tablet  Commonly known as:  NORVASC  Take 1 tablet (10 mg total) by mouth daily.     amoxicillin 500 MG capsule  Commonly known as:  AMOXIL  Take 1 capsule (500 mg total) by mouth every 8 (eight) hours.     aspirin EC 81 MG tablet  Take 81 mg by mouth daily.     cyanocobalamin 1000 MCG/ML injection  Commonly known as:  (VITAMIN B-12)  Inject 1 mL (1,000 mcg total) into the muscle every 30 (thirty) days.     donepezil 5 MG tablet  Commonly known as:  ARICEPT  Take 1 tablet (5 mg total) by mouth at bedtime.     iron polysaccharides 150 MG capsule  Commonly known as:  NU-IRON  Take  1 capsule (150 mg total) by mouth 2 (two) times daily.     LORazepam 1 MG tablet  Commonly known as:  ATIVAN  Take 1 tablet (1 mg total) by mouth at bedtime as needed (aggitation).     metFORMIN 1000 MG tablet  Commonly known as:  GLUCOPHAGE  Take 1 tablet (1,000 mg total) by mouth 2 (two) times daily with a meal.     pioglitazone 45 MG tablet  Commonly known as:  ACTOS  Take 1 tablet (45 mg total) by mouth daily.     VENTOLIN HFA 108 (90 BASE) MCG/ACT inhaler  Generic drug:  albuterol  Inhale 2 puffs into the lungs every 6 (six) hours as needed.         The results of significant diagnostics from this hospitalization (including imaging, microbiology, ancillary and laboratory) are listed below for reference.    Significant Diagnostic Studies: Dg Lumbar Spine 2-3 Views  10/08/2013   CLINICAL DATA:  Back pain with right leg weakness following a fall ; history  of previous knee surgery  EXAM: LUMBAR SPINE - 2-3 VIEW  COMPARISON:  None.  FINDINGS: The lumbar vertebral bodies are preserved in height. The intervertebral disc space heights are well maintained. The pedicles and transverse processes are intact. There is mild facet joint hypertrophy at L4-5 and L5-S1. The observed portions of the sacrum are unremarkable. There are dense calcifications in the wall of the abdominal aorta and iliac arteries. There is a 6 mm diameter calcification projecting over the lower pole of the right kidney. A 2 mm diameter calcification projects over the mid to upper pole on the right.  IMPRESSION: 1. There is no acute abnormality of the lumbar spine. There is mild degenerative facet joint change at L4-5 and L5-S1. 2. There are calcified kidney stones on the right.   Electronically Signed   By: Chantia Amalfitano  [place]   On: 10/08/2013 13:51   Dg Pelvis 1-2 Views  10/08/2013   CLINICAL DATA:  Fall.  All pelvic pain.  EXAM: PELVIS - 1-2 VIEW  COMPARISON:  None.  FINDINGS: There is no evidence of pelvic fracture or  diastasis. No pelvic bone lesions are seen. Atherosclerosis.  IMPRESSION: Negative.   Electronically Signed   By: Andreas Newport M.D.   On: 10/08/2013 11:11   Ct Head Wo Contrast  10/08/2013   CLINICAL DATA:  Dementia, hematuria, fell while in emergency room, frequent falls per family, history asthma, diabetes mellitus, hyperlipidemia, hypertension, coronary artery disease  EXAM: CT HEAD WITHOUT CONTRAST  CT CERVICAL SPINE WITHOUT CONTRAST  TECHNIQUE: Multidetector CT imaging of the head and cervical spine was performed following the standard protocol without intravenous contrast. Multiplanar CT image reconstructions of the cervical spine were also generated.  COMPARISON:  CT head 02/03/2013  FINDINGS: CT HEAD FINDINGS  Generalized atrophy.  Normal ventricular morphology.  No midline shift or mass effect.  Small vessel chronic ischemic changes of deep cerebral white matter.  No intracranial hemorrhage, mass lesion, or acute infarction.  Visualized paranasal sinuses and mastoid air cells clear.  Bones unremarkable.  Atherosclerotic calcifications at the carotid siphons.  CT CERVICAL SPINE FINDINGS  Extensive carotid arterial calcifications.  Prevertebral soft tissues normal thickness.  Bones appear mildly demineralized.  Minimal fluid in a few mastoid air cells, nonspecific.  Skullbase intact.  Mild scattered disc space narrowing with tiny endplate spurs.  Vertebral body heights maintained without fracture or subluxation.  Minor facet degenerative changes at scattered level.  Lung apices clear.  IMPRESSION: Atrophy with small vessel chronic ischemic changes of deep cerebral white matter.  No acute intracranial abnormalities.  No acute cervical spine abnormalities.  Extensive atherosclerotic calcification.   Electronically Signed   By: Ulyses Southward M.D.   On: 10/08/2013 11:07   Ct Cervical Spine Wo Contrast  10/08/2013   CLINICAL DATA:  Dementia, hematuria, fell while in emergency room, frequent falls per  family, history asthma, diabetes mellitus, hyperlipidemia, hypertension, coronary artery disease  EXAM: CT HEAD WITHOUT CONTRAST  CT CERVICAL SPINE WITHOUT CONTRAST  TECHNIQUE: Multidetector CT imaging of the head and cervical spine was performed following the standard protocol without intravenous contrast. Multiplanar CT image reconstructions of the cervical spine were also generated.  COMPARISON:  CT head 02/03/2013  FINDINGS: CT HEAD FINDINGS  Generalized atrophy.  Normal ventricular morphology.  No midline shift or mass effect.  Small vessel chronic ischemic changes of deep cerebral white matter.  No intracranial hemorrhage, mass lesion, or acute infarction.  Visualized paranasal sinuses and mastoid air cells clear.  Bones  unremarkable.  Atherosclerotic calcifications at the carotid siphons.  CT CERVICAL SPINE FINDINGS  Extensive carotid arterial calcifications.  Prevertebral soft tissues normal thickness.  Bones appear mildly demineralized.  Minimal fluid in a few mastoid air cells, nonspecific.  Skullbase intact.  Mild scattered disc space narrowing with tiny endplate spurs.  Vertebral body heights maintained without fracture or subluxation.  Minor facet degenerative changes at scattered level.  Lung apices clear.  IMPRESSION: Atrophy with small vessel chronic ischemic changes of deep cerebral white matter.  No acute intracranial abnormalities.  No acute cervical spine abnormalities.  Extensive atherosclerotic calcification.   Electronically Signed   By: Ulyses Southward M.D.   On: 10/08/2013 11:07   Ct Renal Stone Study  10/08/2013   CLINICAL DATA:  Hematuria with history of nephrolithiasis. Also history of pancreatitis and diabetes and dementia  EXAM: CT ABDOMEN AND PELVIS WITHOUT CONTRAST  TECHNIQUE: Multidetector CT imaging of the abdomen and pelvis was performed following the standard protocol without IV contrast.  COMPARISON:  Lumbar spine series of today's date  FINDINGS: The kidneys are normal in  contour. There is no hydronephrosis. On the right there is a 2 mm upper pole stone. In the lower pole there is an 8 mm stone or group of stones. There is a 1 mm diameter upper pole stone and a 2 mm diameter midpole stone on the left. There are no ureteral stones. The enlarged prostate gland produces a mild impression upon the otherwise normal-appearing urinary bladder.  The liver, gallbladder, pancreas, spleen, nondistended stomach, and adrenal glands are unremarkable. There is atherosclerotic change of the abdominal aorta. There is a focal lateral bulge demonstrated on image 31 where the maximal transverse dimension of the aorta is 2.7 cm. Just above this the maximal dimension is 2.5 cm. There is no evidence of periaortic hemorrhage.  The small and large bowel exhibit no evidence of ileus nor obstruction. The appendix is normal. There is sigmoid diverticulosis without evidence of acute diverticulitis. There is no ascites. There is no inguinal nor umbilical hernia.  The lung bases are clear. The lumbar spine and bony pelvis exhibit no acute abnormalities. There is a unilateral pars defect on the right at L5. There is no spondylolisthesis.  IMPRESSION: 1. There are calcified nonobstructing kidney stones bilaterally. There is enlargement of the prostate gland. No acute abnormality of the kidneys or ureters or urinary bladder is demonstrated. 2. There is no acute hepatobiliary nor acute bowel abnormality. 3. There is atherosclerotic change of the abdominal aorta with failure to taper of its caliber but no definite aneurysm.   Electronically Signed   By: Golden Emile  [place]   On: 10/08/2013 17:53     Microbiology: Recent Results (from the past 240 hour(s))  URINE CULTURE     Status: None   Collection Time    10/08/13 10:53 AM      Result Value Ref Range Status   Specimen Description URINE, RANDOM   Final   Special Requests NONE   Final   Culture  Setup Time     Final   Value: 10/08/2013 14:44     Performed at  [facility]   Colony Count     Final   Value: >=100,000 COLONIES/ML     Performed at [facility]   Culture     Final   Value: ESCHERICHIA COLI     Performed at [facility]   Report Status 10/10/2013 FINAL   Final   Organism ID, Bacteria  ESCHERICHIA COLI   Final     Labs: Basic Metabolic Panel:  Recent Labs Lab 10/08/13 1037 10/08/13 1503 10/09/13 0413 10/10/13 0447 10/11/13 0450  NA 143  --  142 143 140  K 3.7  --  3.1* 3.4* 3.2*  CL 104  --  104 106 102  CO2 25  --  25 25 24   GLUCOSE 199*  --  122* 116* 192*  BUN 16  --  13 12 11   CREATININE 1.13  --  1.05 1.08 0.89  CALCIUM 9.4  --  8.4 8.4 8.7  MG  --  1.4*  --  2.0  --    Liver Function Tests: No results found for this basename: AST, ALT, ALKPHOS, BILITOT, PROT, ALBUMIN,  in the last 168 hours No results found for this basename: LIPASE, AMYLASE,  in the last 168 hours No results found for this basename: AMMONIA,  in the last 168 hours CBC:  Recent Labs Lab 10/08/13 1037 10/09/13 0413 10/10/13 0447 10/11/13 0450  WBC 17.1* 12.9* 10.7* 9.3  NEUTROABS 15.4*  --   --   --   HGB 12.4* 10.8* 11.5* 12.8*  HCT 37.2* 32.1* 34.4* 38.9*  MCV 97.6 97.6 98.0 98.5  PLT 372 329 336 357   Cardiac Enzymes:  Recent Labs Lab 10/08/13 1503 10/09/13 1418 10/10/13 0447 10/11/13 0450  CKTOTAL 1498* 794* 418* 303*   BNP: No components found with this basename: POCBNP,  CBG:  Recent Labs Lab 10/11/13 0738 10/11/13 1236 10/11/13 1634 10/11/13 2207 10/12/13 0802  GLUCAP 150* 130* 148* 146* 131*    Time coordinating discharge:  Greater than 30 minutes  Signed:  Ricard Faulkner, DO [facility] Pager: [phone] 10/12/2013, 10:16 AM

## 2013-10-20 DIAGNOSIS — E119 Type 2 diabetes mellitus without complications: Secondary | ICD-10-CM | POA: Diagnosis not present

## 2013-10-20 DIAGNOSIS — R41841 Cognitive communication deficit: Secondary | ICD-10-CM | POA: Diagnosis not present

## 2013-10-20 DIAGNOSIS — Z79899 Other long term (current) drug therapy: Secondary | ICD-10-CM | POA: Diagnosis not present

## 2013-10-20 DIAGNOSIS — N200 Calculus of kidney: Secondary | ICD-10-CM | POA: Diagnosis not present

## 2013-10-20 DIAGNOSIS — F0151 Vascular dementia with behavioral disturbance: Secondary | ICD-10-CM | POA: Diagnosis not present

## 2013-10-20 DIAGNOSIS — I10 Essential (primary) hypertension: Secondary | ICD-10-CM | POA: Diagnosis not present

## 2013-10-20 DIAGNOSIS — M6281 Muscle weakness (generalized): Secondary | ICD-10-CM | POA: Diagnosis not present

## 2013-10-20 DIAGNOSIS — F0150 Vascular dementia without behavioral disturbance: Secondary | ICD-10-CM | POA: Diagnosis not present

## 2013-10-20 DIAGNOSIS — E1142 Type 2 diabetes mellitus with diabetic polyneuropathy: Secondary | ICD-10-CM | POA: Diagnosis not present

## 2013-10-20 DIAGNOSIS — N390 Urinary tract infection, site not specified: Secondary | ICD-10-CM | POA: Diagnosis not present

## 2013-10-20 DIAGNOSIS — R278 Other lack of coordination: Secondary | ICD-10-CM | POA: Diagnosis not present

## 2013-10-20 DIAGNOSIS — J449 Chronic obstructive pulmonary disease, unspecified: Secondary | ICD-10-CM | POA: Diagnosis not present

## 2013-10-20 DIAGNOSIS — I2510 Atherosclerotic heart disease of native coronary artery without angina pectoris: Secondary | ICD-10-CM | POA: Diagnosis not present

## 2013-10-20 DIAGNOSIS — R279 Unspecified lack of coordination: Secondary | ICD-10-CM | POA: Diagnosis not present

## 2013-10-20 DIAGNOSIS — D649 Anemia, unspecified: Secondary | ICD-10-CM | POA: Diagnosis not present

## 2013-10-25 ENCOUNTER — Non-Acute Institutional Stay (SKILLED_NURSING_FACILITY): Payer: Medicare Other | Admitting: Internal Medicine

## 2013-10-25 DIAGNOSIS — F01518 Vascular dementia, unspecified severity, with other behavioral disturbance: Secondary | ICD-10-CM

## 2013-10-25 DIAGNOSIS — F0151 Vascular dementia with behavioral disturbance: Secondary | ICD-10-CM | POA: Diagnosis not present

## 2013-10-25 DIAGNOSIS — E1142 Type 2 diabetes mellitus with diabetic polyneuropathy: Secondary | ICD-10-CM | POA: Diagnosis not present

## 2013-10-25 DIAGNOSIS — N200 Calculus of kidney: Secondary | ICD-10-CM

## 2013-10-27 ENCOUNTER — Ambulatory Visit: Payer: Medicare Other | Admitting: Physician Assistant

## 2013-11-01 NOTE — Progress Notes (Addendum)
Patient ID: Jonathan [name], male   DOB: December 20, 1940, 73 y.o.   MRN: [id number]               HISTORY & PHYSICAL  DATE:  10/25/2013    [facility]    LEVEL OF CARE:   SNF   CHIEF COMPLAINT:  Admission to his facility, post [facility].    HISTORY OF PRESENT ILLNESS:  This is a 73 year-old man who was [facility] I believe also their skilled unit.   This was done as this facility has a locked unit.  He is listed as having vascular dementia.     He was recently hospitalized at [facility] from 10/08/2013 through 10/12/2013 with acute delirium and lethargy, felt secondary to a UTI.  His urine culture grew greater than 100,000 E.coli, treated with Rocephin.  He had hematuria at that point.  A CT scan of the abdomen and pelvis showed bilateral parenchymal nephrolithiasis without ureteral stones or hydronephrosis.    During this admission, he was also found to have a very significantly elevated CPK, felt to be secondary to the trauma of falling.  He was also noted to be hypomagnesemic.    PAST MEDICAL HISTORY/PROBLEM LIST:  Includes:    Recent hospitalization for E.coli pyelonephritis.    Hematuria secondary to #1, and also bilateral nephrolithiasis.    Mechanical falls, generalized weakness.    Hypomagnesemia.    Rhabdomyolysis secondary to trauma.    Dementia.    Type 2 diabetes.  On oral agents with a hemoglobin A1c of 6.4.    Hypertension.    Hyperlipidemia.  Lipitor was discontinued due to the elevated CPK with instructions for Primary Care to determine the optimal time to restart the Lipitor.    History of COPD.    History of constipation.    CURRENT MEDICATIONS:  Medication list is reviewed.    Amlodipine 10 q.d.    Enteric-coated aspirin 81 q.d.    Vitamin B12, 1000 mg IM q.month (noted to have B12 deficiency, skimming [facility]).   Aricept 5 q.h.s.     Nu-Iron 150  b.i.d.    Metformin 1000 b.i.d.    Actos 45 q.d.    Lorazepam 1 mg q.h.s. p.r.n.     Ventolin inhaler 2 puffs q.6 p.r.n.     SOCIAL HISTORY:  I have no information on this man.   ADVANCED DIRECTIVES:  He comes with a healthcare power of attorney paperwork, but no advanced directives.  In fact, he is stated as being Full Code.      FAMILY HISTORY:  No information.    REVIEW OF SYSTEMS:  Not really possible from this man.     PHYSICAL EXAMINATION:   VITAL SIGNS:   O2 SATURATIONS:  94% on room air.   RESPIRATIONS:  16 and unlabored.   PULSE:  67.   GENERAL APPEARANCE:  The patient is lying in bed, not in any distress.   HEENT:   MOUTH/THROAT:   Few remaining teeth.   LYMPHATICS:  None palpable in the cervical, clavicular or axillary regions.   CHEST/RESPIRATORY:  Chest:  Barrel-shaped chest.  Reasonably clear air entry bilaterally.   CARDIOVASCULAR:  CARDIAC:   Heart sounds are normal.  There are no murmurs.  He appears to be euvolemic.   GASTROINTESTINAL:  LIVER/SPLEEN/KIDNEYS:  No liver, no spleen.  No tenderness.   GENITOURINARY:  BLADDER:   Not enlarged.  There is no CVA tenderness.   CIRCULATION:   ARTERIAL:  Extremities:  Peripheral pulses are palpable.   EDEMA/VARICOSITIES:  No edema.   NEUROLOGICAL:   He speaks in a very low monotone voice.  He is listed as having vascular dementia.  This would seem reasonably likely.   SENSATION/STRENGTH:  He has antigravity strength in all his limbs.    DEEP TENDON REFLEXES:  He is diffusely hyporeflexic, probably secondary to diabetic neuropathy.  PSYCHIATRIC:   MENTAL STATUS:   Stated he was [age], that he was born in [birth year].   He states he has been in this building for a month.  Cannot remember where he was beforehand.  States he is married with two children.  He does not know the year or month.    ASSESSMENT/PLAN:  Dementia, vascular.  Listed as the primary diagnosis.  This would seem likely to be correct, or at least a prominent  vascular component.  He is on aspirin.  This is reasonable.    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  At some point should be checked for autonomic neuropathy.  He is on both Actos and metformin.   Recent lab work from the hospital showed a potassium of 3.2 on 10/11/2013.  Sodium, BUN and creatinine were all within the normal range.  His hemoglobin A1c was 6.4.    Hypomagnesemia.  His admission magnesium level was 1.4.  His potassium was 3.1.  The reasons behind this are not clear, but they should be rechecked.    Moderate to severe dementia.  He is on a small dose of Aricept.  This probably should be increased.    History of nephrolithiasis; ?symtomatic

## 2013-11-10 ENCOUNTER — Ambulatory Visit: Payer: Medicare Other | Admitting: Physician Assistant

## 2013-12-16 DIAGNOSIS — N390 Urinary tract infection, site not specified: Secondary | ICD-10-CM | POA: Diagnosis not present

## 2013-12-27 ENCOUNTER — Non-Acute Institutional Stay (SKILLED_NURSING_FACILITY): Payer: Medicare Other | Admitting: Internal Medicine

## 2013-12-27 DIAGNOSIS — N10 Acute tubulo-interstitial nephritis: Secondary | ICD-10-CM | POA: Diagnosis not present

## 2013-12-27 DIAGNOSIS — F0151 Vascular dementia with behavioral disturbance: Secondary | ICD-10-CM | POA: Diagnosis not present

## 2013-12-27 DIAGNOSIS — F01518 Vascular dementia, unspecified severity, with other behavioral disturbance: Secondary | ICD-10-CM

## 2013-12-31 DIAGNOSIS — I10 Essential (primary) hypertension: Secondary | ICD-10-CM | POA: Diagnosis not present

## 2013-12-31 DIAGNOSIS — I2510 Atherosclerotic heart disease of native coronary artery without angina pectoris: Secondary | ICD-10-CM | POA: Diagnosis not present

## 2013-12-31 DIAGNOSIS — E119 Type 2 diabetes mellitus without complications: Secondary | ICD-10-CM | POA: Diagnosis not present

## 2013-12-31 DIAGNOSIS — Z9181 History of falling: Secondary | ICD-10-CM | POA: Diagnosis not present

## 2013-12-31 DIAGNOSIS — R41841 Cognitive communication deficit: Secondary | ICD-10-CM | POA: Diagnosis not present

## 2013-12-31 DIAGNOSIS — F0150 Vascular dementia without behavioral disturbance: Secondary | ICD-10-CM | POA: Diagnosis not present

## 2013-12-31 DIAGNOSIS — J449 Chronic obstructive pulmonary disease, unspecified: Secondary | ICD-10-CM | POA: Diagnosis not present

## 2013-12-31 DIAGNOSIS — Z87891 Personal history of nicotine dependence: Secondary | ICD-10-CM | POA: Diagnosis not present

## 2013-12-31 DIAGNOSIS — D649 Anemia, unspecified: Secondary | ICD-10-CM | POA: Diagnosis not present

## 2014-01-03 DIAGNOSIS — R41841 Cognitive communication deficit: Secondary | ICD-10-CM | POA: Diagnosis not present

## 2014-01-03 DIAGNOSIS — F0150 Vascular dementia without behavioral disturbance: Secondary | ICD-10-CM | POA: Diagnosis not present

## 2014-01-03 DIAGNOSIS — I10 Essential (primary) hypertension: Secondary | ICD-10-CM | POA: Diagnosis not present

## 2014-01-03 DIAGNOSIS — E119 Type 2 diabetes mellitus without complications: Secondary | ICD-10-CM | POA: Diagnosis not present

## 2014-01-03 DIAGNOSIS — I2510 Atherosclerotic heart disease of native coronary artery without angina pectoris: Secondary | ICD-10-CM | POA: Diagnosis not present

## 2014-01-03 DIAGNOSIS — J449 Chronic obstructive pulmonary disease, unspecified: Secondary | ICD-10-CM | POA: Diagnosis not present

## 2014-01-03 NOTE — Progress Notes (Signed)
Patient ID: Jonathan [name], male   DOB: 03/16/1940, 73 y.o.   MRN: [id number]                 PROGRESS NOTE  DATE:  12/27/2013      [facility]    LEVEL OF CARE:   SNF   Acute Visit/Discharge Visit       CHIEF COMPLAINT:  Pre-discharge review.       HISTORY OF PRESENT ILLNESS:  This is a patient who came here after [facility].  I think this was done as this facility has a locked unit.    He has severe vascular dementia.    He has type 2 diabetes with neuropathy, on Actos and metformin.  His hemoglobin A1c in October was 6.1.    He was recently treated for a UTI with decreased appetite, increased confusion, and a temperature of 100.3.  He was treated empirically with Cipro.  I do not see the final C&S results, although this seems to have solved the problem.    He apparently walks in his room unassisted.    CURRENT MEDICATIONS:  Medication list is reviewed.     Norvasc 10 q.d.    Enteric-coated aspirin 81 q.d.    Aricept 10 q.h.s.     Vitamin B12, 1000 mg monthly.    Ferrex 150 b.i.d.    Metformin 1000 b.i.d.    Actos 45 q.d.    BLOOD SUGARS:  Accu-Cheks generally in the low 100s twice daily.    PHYSICAL EXAMINATION:                 GENERAL APPEARANCE:  The patient does not look to be in any distress.   CHEST/RESPIRATORY:  Clear air entry bilaterally.   CARDIOVASCULAR:  CARDIAC:   Heart sounds are normal.  He appears to be euvolemic.   GASTROINTESTINAL:  LIVER/SPLEEN/KIDNEYS:  No liver, no spleen.   ABDOMEN:  No masses.     GENITOURINARY:  BLADDER:   Not enlarged.    NEUROLOGICAL:    BALANCE/GAIT:   I did not attempt to ambulate him.    ASSESSMENT/PLAN:                       Vascular dementia.  This is severe.  He apparently is going home with his wife, who has care arranged for him at home.    Recently treated for presumed pyelonephritis with Cipro.  I do not actually see the culture result here,  although he appears to be better.  He has a history of nephrolithiasis.    Significant dementia.  No behavioral disturbances noted, although he does episodically receive Ativan at h.s.            CPT CODE: [id number]

## 2014-01-04 DIAGNOSIS — I2510 Atherosclerotic heart disease of native coronary artery without angina pectoris: Secondary | ICD-10-CM | POA: Diagnosis not present

## 2014-01-04 DIAGNOSIS — I10 Essential (primary) hypertension: Secondary | ICD-10-CM | POA: Diagnosis not present

## 2014-01-04 DIAGNOSIS — J449 Chronic obstructive pulmonary disease, unspecified: Secondary | ICD-10-CM | POA: Diagnosis not present

## 2014-01-04 DIAGNOSIS — F0150 Vascular dementia without behavioral disturbance: Secondary | ICD-10-CM | POA: Diagnosis not present

## 2014-01-04 DIAGNOSIS — E119 Type 2 diabetes mellitus without complications: Secondary | ICD-10-CM | POA: Diagnosis not present

## 2014-01-04 DIAGNOSIS — R41841 Cognitive communication deficit: Secondary | ICD-10-CM | POA: Diagnosis not present

## 2014-01-05 ENCOUNTER — Telehealth: Payer: Self-pay | Admitting: Family Medicine

## 2014-01-05 DIAGNOSIS — I2510 Atherosclerotic heart disease of native coronary artery without angina pectoris: Secondary | ICD-10-CM | POA: Diagnosis not present

## 2014-01-05 DIAGNOSIS — F0150 Vascular dementia without behavioral disturbance: Secondary | ICD-10-CM | POA: Diagnosis not present

## 2014-01-05 DIAGNOSIS — R41841 Cognitive communication deficit: Secondary | ICD-10-CM | POA: Diagnosis not present

## 2014-01-05 DIAGNOSIS — I10 Essential (primary) hypertension: Secondary | ICD-10-CM | POA: Diagnosis not present

## 2014-01-05 DIAGNOSIS — J449 Chronic obstructive pulmonary disease, unspecified: Secondary | ICD-10-CM | POA: Diagnosis not present

## 2014-01-05 DIAGNOSIS — E119 Type 2 diabetes mellitus without complications: Secondary | ICD-10-CM | POA: Diagnosis not present

## 2014-01-05 NOTE — Telephone Encounter (Signed)
Agree with the treatment plan documented.

## 2014-01-05 NOTE — Telephone Encounter (Signed)
Home Health nurse calls to report patient has had three falls recently at home.  She assessed him for any injuries.  He C/O elbow pain.  Nurse stated elbow had full ROM and was just sore with ROM.  Wife said he had a nodule on his neck.  Nurse felt possible infected hair follicle.  Small spot, did drain some pus. Nurse told wife to keep area clean, allow it to drain if more pus.  Put some warm compresses to site.  Let her or us know if site worsens.  Has appt schedule for 01/25/14.

## 2014-01-06 ENCOUNTER — Encounter: Payer: Self-pay | Admitting: Physician Assistant

## 2014-01-06 ENCOUNTER — Telehealth: Payer: Self-pay | Admitting: Physician Assistant

## 2014-01-06 ENCOUNTER — Telehealth: Payer: Self-pay | Admitting: Family Medicine

## 2014-01-06 DIAGNOSIS — I2510 Atherosclerotic heart disease of native coronary artery without angina pectoris: Secondary | ICD-10-CM | POA: Diagnosis not present

## 2014-01-06 DIAGNOSIS — J449 Chronic obstructive pulmonary disease, unspecified: Secondary | ICD-10-CM | POA: Diagnosis not present

## 2014-01-06 DIAGNOSIS — E119 Type 2 diabetes mellitus without complications: Secondary | ICD-10-CM | POA: Diagnosis not present

## 2014-01-06 DIAGNOSIS — I10 Essential (primary) hypertension: Secondary | ICD-10-CM | POA: Diagnosis not present

## 2014-01-06 DIAGNOSIS — R41841 Cognitive communication deficit: Secondary | ICD-10-CM | POA: Diagnosis not present

## 2014-01-06 DIAGNOSIS — F0150 Vascular dementia without behavioral disturbance: Secondary | ICD-10-CM | POA: Diagnosis not present

## 2014-01-06 MED ORDER — SULFAMETHOXAZOLE-TRIMETHOPRIM 800-160 MG PO TABS: 1.0000 | ORAL_TABLET | Freq: Two times a day (BID) | ORAL | Status: DC

## 2014-01-06 NOTE — Telephone Encounter (Signed)
See phone note from yesterday.  Nurse at home and says site much worse today.  Now size bigger then half dollar. Site dry, hard, nickel size red bump in middle.  Nurse feels it needs to be lanced or patient needs antibiotics.

## 2014-01-06 NOTE — Telephone Encounter (Signed)
Tried to call [facility] nurse, left message.  Spoke to wife, aware of antibiotic order. If worsens over weekend will need to go UC or ED.

## 2014-01-06 NOTE — Telephone Encounter (Signed)
Begin bactrim ds pobid for 7 days.  NTBS if worse or no better.

## 2014-01-06 NOTE — Telephone Encounter (Signed)
Fax to [facility]  Phone # [phone]  Just can't handle him at home anymore.

## 2014-01-06 NOTE — Telephone Encounter (Signed)
[phone] PT is wanting to have a FL2 form filled out

## 2014-01-10 ENCOUNTER — Encounter: Payer: Self-pay | Admitting: Physician Assistant

## 2014-01-10 ENCOUNTER — Ambulatory Visit (INDEPENDENT_AMBULATORY_CARE_PROVIDER_SITE_OTHER): Payer: Medicare Other | Admitting: Physician Assistant

## 2014-01-10 VITALS — BP 104/56 | HR 64 | Temp 97.6°F | Resp 18 | Wt 147.0 lb

## 2014-01-10 DIAGNOSIS — L0291 Cutaneous abscess, unspecified: Secondary | ICD-10-CM | POA: Diagnosis not present

## 2014-01-10 DIAGNOSIS — F0391 Unspecified dementia with behavioral disturbance: Secondary | ICD-10-CM | POA: Diagnosis not present

## 2014-01-10 MED ORDER — DOXYCYCLINE HYCLATE 100 MG PO TABS: 100.0000 mg | ORAL_TABLET | Freq: Two times a day (BID) | ORAL | Status: DC

## 2014-01-10 NOTE — Telephone Encounter (Signed)
FL-2 completed and signed by Dr Tanya Nones.  Faxed to [facility].  Wife is aware and has original FL-2 with her.

## 2014-01-10 NOTE — Telephone Encounter (Signed)
Pt wife called and left voicemail over the holiday stating that she had the fax number for [facility] [phone]  And was wanting to know if she could have the FL2 form filled out.

## 2014-01-10 NOTE — Progress Notes (Signed)
Patient ID: Jonathan [name] MRN: [id number], DOB: [birth year], 74 y.o. Date of Encounter: 01/10/2014, 10:47 AM    Chief Complaint:  Chief Complaint  Patient presents with  . infection on back of neck  . needs FL2 form     HPI: 74 y.o. year old white male here with his wife.  He has severe dementia. She says that he was hospitalized October 2 with a UTI and since the time of that hospital discharge he has been in a facility called [facility]. She says that the Medicare coverage ran out. Says it is extremely expensive for self-pay. She is in the process of trying to get Medicaid coverage for it. In the meanwhile she does have to pay self-pay for it. So far she paid to self-pay for 16 days worth of it. Then she paid a price for them to hold his bed but she brought him home with her from December 24th until now.  However she is taking him back there to the facility. She says that she was hoping she could care for him at home but she says she can't. He is combative. He was up and down all night Friday night. Never went to bed / to sleep Saturday night. Says that he woke up soaking wet with urine.  She is here for [place] to sign his FL 2 form and she is taking him back to the facility  once FL 2 form is completed and turned into them.  This past Thursday a home health nurse saw that he had an abscess on his neck. They called here and Dr. Tanya Nones called in Bactrim which she has been taking by mouth twice a day.     Home Meds:   Outpatient Prescriptions Prior to Visit  Medication Sig Dispense Refill  . albuterol (VENTOLIN HFA) 108 (90 BASE) MCG/ACT inhaler Inhale 2 puffs into the lungs every 6 (six) hours as needed.      Marland Kitchen amLODipine (NORVASC) 10 MG tablet Take 1 tablet (10 mg total) by mouth daily. 90 tablet 3  . aspirin EC 81 MG tablet Take 81 mg by mouth daily.    . cyanocobalamin (,VITAMIN B-12,) 1000 MCG/ML injection Inject 1 mL (1,000 mcg total) into the muscle every 30 (thirty)  days. 1 mL 3  . LORazepam (ATIVAN) 1 MG tablet Take 1 tablet (1 mg total) by mouth at bedtime as needed (aggitation). 30 tablet 0  . metFORMIN (GLUCOPHAGE) 1000 MG tablet Take 1 tablet (1,000 mg total) by mouth 2 (two) times daily with a meal. 180 tablet 3  . pioglitazone (ACTOS) 45 MG tablet Take 1 tablet (45 mg total) by mouth daily. 90 tablet 3  . sulfamethoxazole-trimethoprim (BACTRIM DS,SEPTRA DS) 800-160 MG per tablet Take 1 tablet by mouth 2 (two) times daily. 14 tablet 0  . iron polysaccharides (NU-IRON) 150 MG capsule Take 1 capsule (150 mg total) by mouth 2 (two) times daily. 180 capsule 3  . amoxicillin (AMOXIL) 500 MG capsule Take 1 capsule (500 mg total) by mouth every 8 (eight) hours. 9 capsule 0  . donepezil (ARICEPT) 5 MG tablet Take 1 tablet (5 mg total) by mouth at bedtime. 90 tablet 2   No facility-administered medications prior to visit.    Allergies: No Known Allergies    Review of Systems: See HPI for pertinent ROS. All other ROS negative.    Physical Exam: Blood pressure 104/56, pulse 64, temperature 97.6 F (36.4 C), temperature source Oral, resp. rate 18,  weight 147 lb (66.679 kg)., Body mass index is 21.09 kg/(m^2). General: WM. Demented.  Appears in no acute distress. Neck: Supple. No thyromegaly. No lymphadenopathy. Lungs: Clear bilaterally to auscultation without wheezes, rales, or rhonchi. Breathing is unlabored. Heart: Regular rhythm. No murmurs, rubs, or gallops. Msk:  Strength and tone normal for age. Skin: Left Posterior Neck: Approx 1cm abscess--firm--open and draining--purulent drainage.  Approx 1 cm below this, there is a small 3 mm papule--tip of this white/beige with drainage.  Neuro: Alert and oriented X 3. Moves all extremities spontaneously. Gait is normal. CNII-XII grossly in tact. Psych:  Severe Dementia.     ASSESSMENT AND PLAN:  74 y.o. year old male with  1. Dementia, with behavioral disturbance FL2 completed.   2. Abscess Continue  Bactrim BID. Add Doxy in addition to cover MRSA.  Will not I & D as pt combative. To apply warm compress and drain.  Discussed this is infection and discussed proper hygiene to prevent spread of infection.  - doxycycline (VIBRA-TABS) 100 MG tablet; Take 1 tablet (100 mg total) by mouth 2 (two) times daily.  Dispense: 28 tablet; Refill: 0   Signed, [address] [place], [name], [facility] 01/10/2014 10:47 AM

## 2014-01-12 ENCOUNTER — Non-Acute Institutional Stay (SKILLED_NURSING_FACILITY): Payer: Medicare Other | Admitting: Internal Medicine

## 2014-01-12 DIAGNOSIS — E1142 Type 2 diabetes mellitus with diabetic polyneuropathy: Secondary | ICD-10-CM

## 2014-01-12 DIAGNOSIS — N200 Calculus of kidney: Secondary | ICD-10-CM

## 2014-01-12 DIAGNOSIS — F01518 Vascular dementia, unspecified severity, with other behavioral disturbance: Secondary | ICD-10-CM

## 2014-01-12 DIAGNOSIS — F0151 Vascular dementia with behavioral disturbance: Secondary | ICD-10-CM | POA: Diagnosis not present

## 2014-01-17 ENCOUNTER — Inpatient Hospital Stay: Payer: Medicare Other | Admitting: Physician Assistant

## 2014-01-17 NOTE — Progress Notes (Addendum)
Patient ID: Jonathan [name], male   DOB: 03/27/1940, 74 y.o.   MRN: [id number]               HISTORY & PHYSICAL  DATE:  01/12/2014                   FACILITY: [facility]    LEVEL OF CARE:   SNF   CHIEF COMPLAINT:  Readmission to the facility.    HISTORY OF PRESENT ILLNESS:  This is a man whom I initially admitted to this building in mid October 2015.  He came here, I think, from [facility].  I think this was done as the facility here has a locked unit.  He has severe vascular dementia.    I last saw him on 12/27/2013 to discharge back to home with his wife.  Apparently, she could not manage his care and has brought him back to the facility for readmission.    PAST MEDICAL HISTORY/PROBLEM LIST:                E.coli pyelonephritis.    Bilateral nephrolithiasis.    Mechanical falls.    Hypomagnesemia.    Rhabdomyolysis secondary to trauma.    Dementia.    Type 2 diabetes.  On oral agents.    Hypertension.     Hyperlipidemia, with a history of statin-induced CPK elevation.    History of COPD.    CURRENT MEDICATIONS:  Medication list is reviewed.                Aricept 10 q.h.s.     Metformin 1000 b.i.d.    Amlodipine 10 q.d.    Enteric-coated aspirin 81 q.d.    Iron 150 b.i.d.    Vitamin B12, 1000 IM monthly.    Bactrim DS 1 p.o. b.i.d.    Doxycycline 100 b.i.d.     Pioglitazone 45 mg daily.    SOCIAL HISTORY:                  ADVANCED DIRECTIVES:  I believe he is a DNR.    REVIEW OF SYSTEMS:  Not possible due to advanced dementia.    PHYSICAL EXAMINATION:   GENERAL APPEARANCE:  The patient is not in any distress.   HEENT:   MOUTH/THROAT:   He will not open his mouth.   CHEST/RESPIRATORY:  Clear air entry bilaterally.  Chest is slightly barrel-shaped, making me wonder about underlying COPD.         CARDIOVASCULAR:  CARDIAC:   Heart sounds are normal.   He appears to be euvolemic.      GASTROINTESTINAL:  LIVER/SPLEEN/KIDNEYS:  No liver,  no spleen.  No tenderness.   GENITOURINARY:  BLADDER:   Not enlarged.   CIRCULATION:   EDEMA/VARICOSITIES:   Extremities:  No edema.   SKIN:  INSPECTION:  He does not appear to have any pressure areas.  At the hairline on the back of his neck is a still draining abscess.  There is a smaller area just near this that appears to be resolving.     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  The patient is not verbal.  He does not look any different than when he left the facility two weeks or so ago.    Type 2 diabetes.  On oral agents.  If I remember correctly, his hemoglobin A1c  was actually quite low, making me think we could probably back off some on the Actos.  I think it was 6.1, if I am not mistaken.  Recently treated for a UTI with Cipro.  I never did see the urine culture.  He does have a history of nephrolithiasis.    Skin abscesses.  I am going to apply topical Bactroban to this, dry dressing.  The one at the hairline on the back of his neck may need to be I&D'd.

## 2014-01-19 ENCOUNTER — Non-Acute Institutional Stay (SKILLED_NURSING_FACILITY): Payer: Medicare Other | Admitting: Internal Medicine

## 2014-01-19 DIAGNOSIS — B9789 Other viral agents as the cause of diseases classified elsewhere: Principal | ICD-10-CM

## 2014-01-19 DIAGNOSIS — J069 Acute upper respiratory infection, unspecified: Secondary | ICD-10-CM | POA: Diagnosis not present

## 2014-01-20 DIAGNOSIS — R0989 Other specified symptoms and signs involving the circulatory and respiratory systems: Secondary | ICD-10-CM | POA: Diagnosis not present

## 2014-01-20 DIAGNOSIS — R05 Cough: Secondary | ICD-10-CM | POA: Diagnosis not present

## 2014-01-24 NOTE — Progress Notes (Addendum)
Patient ID: Jonathan [name], male   DOB: [birth year], 74 y.o.   MRN: [id number]               PROGRESS NOTE  DATE:  01/19/2014                               [facility]    LEVEL OF CARE:   SNF   Acute Visit   CHIEF COMPLAINT:  Cough and congestion.    HISTORY OF PRESENT ILLNESS:  This is a patient whom I readmitted to the facility last week.  He had been sent home just before Christmas.  However, his wife returned him here, I think being unable to look after him at home.    He has moderate to severe vascular dementia.  He is also a type 2 diabetic.    Noted by the staff today to have a loose cough.    REVIEW OF SYSTEMS:  Really not possible in this man.  He only states, "I feel fine."    PHYSICAL EXAMINATION:   VITAL SIGNS:   BLOOD PRESSURE:  148/64.   TEMPERATURE:  98.2.    PULSE:  74.   RESPIRATIONS:  20.   O2 SATURATIONS:  Room air sats are 94%.   GENERAL APPEARANCE:   The patient does not look to be in any distress.    CHEST/RESPIRATORY:  Clear air entry bilaterally.    CARDIOVASCULAR:  CARDIAC:   Heart sounds are normal.  He appears to be euvolemic.   GASTROINTESTINAL:  ABDOMEN:   Soft.  There are no masses.  No tenderness.   GENITOURINARY:  BLADDER:   No suprapubic or costovertebral angle tenderness.    ASSESSMENT/PLAN:    I suspect this is a viral upper respiratory tract issue.  He appears to have some congestion.  Aspiration is possible.  However, right now I think observation without further imaging studies or antibiotics is the route to go here.  I see no reason for empiric studies.  His respiratory status seems stable.  Certainly, no indication at this point for empiric antibiotics.

## 2014-01-25 ENCOUNTER — Inpatient Hospital Stay: Payer: Medicare Other | Admitting: Physician Assistant

## 2014-02-07 DIAGNOSIS — Z9181 History of falling: Secondary | ICD-10-CM | POA: Diagnosis not present

## 2014-02-07 DIAGNOSIS — J439 Emphysema, unspecified: Secondary | ICD-10-CM | POA: Diagnosis not present

## 2014-02-07 DIAGNOSIS — M6281 Muscle weakness (generalized): Secondary | ICD-10-CM | POA: Diagnosis not present

## 2014-02-08 DIAGNOSIS — J439 Emphysema, unspecified: Secondary | ICD-10-CM | POA: Diagnosis not present

## 2014-02-08 DIAGNOSIS — Z9181 History of falling: Secondary | ICD-10-CM | POA: Diagnosis not present

## 2014-02-08 DIAGNOSIS — M6281 Muscle weakness (generalized): Secondary | ICD-10-CM | POA: Diagnosis not present

## 2014-02-09 DIAGNOSIS — M6281 Muscle weakness (generalized): Secondary | ICD-10-CM | POA: Diagnosis not present

## 2014-02-09 DIAGNOSIS — J439 Emphysema, unspecified: Secondary | ICD-10-CM | POA: Diagnosis not present

## 2014-02-09 DIAGNOSIS — Z9181 History of falling: Secondary | ICD-10-CM | POA: Diagnosis not present

## 2014-02-11 DIAGNOSIS — J439 Emphysema, unspecified: Secondary | ICD-10-CM | POA: Diagnosis not present

## 2014-02-11 DIAGNOSIS — Z9181 History of falling: Secondary | ICD-10-CM | POA: Diagnosis not present

## 2014-02-11 DIAGNOSIS — M6281 Muscle weakness (generalized): Secondary | ICD-10-CM | POA: Diagnosis not present

## 2014-02-12 DIAGNOSIS — Z9181 History of falling: Secondary | ICD-10-CM | POA: Diagnosis not present

## 2014-02-12 DIAGNOSIS — M6281 Muscle weakness (generalized): Secondary | ICD-10-CM | POA: Diagnosis not present

## 2014-02-12 DIAGNOSIS — J439 Emphysema, unspecified: Secondary | ICD-10-CM | POA: Diagnosis not present

## 2014-02-14 DIAGNOSIS — J439 Emphysema, unspecified: Secondary | ICD-10-CM | POA: Diagnosis not present

## 2014-02-14 DIAGNOSIS — Z9181 History of falling: Secondary | ICD-10-CM | POA: Diagnosis not present

## 2014-02-14 DIAGNOSIS — M6281 Muscle weakness (generalized): Secondary | ICD-10-CM | POA: Diagnosis not present

## 2014-02-15 DIAGNOSIS — Z9181 History of falling: Secondary | ICD-10-CM | POA: Diagnosis not present

## 2014-02-15 DIAGNOSIS — M6281 Muscle weakness (generalized): Secondary | ICD-10-CM | POA: Diagnosis not present

## 2014-02-15 DIAGNOSIS — J439 Emphysema, unspecified: Secondary | ICD-10-CM | POA: Diagnosis not present

## 2014-02-16 DIAGNOSIS — F0151 Vascular dementia with behavioral disturbance: Secondary | ICD-10-CM | POA: Diagnosis not present

## 2014-02-16 DIAGNOSIS — M6281 Muscle weakness (generalized): Secondary | ICD-10-CM | POA: Diagnosis not present

## 2014-02-16 DIAGNOSIS — Z9181 History of falling: Secondary | ICD-10-CM | POA: Diagnosis not present

## 2014-02-16 DIAGNOSIS — J439 Emphysema, unspecified: Secondary | ICD-10-CM | POA: Diagnosis not present

## 2014-02-17 ENCOUNTER — Non-Acute Institutional Stay (SKILLED_NURSING_FACILITY): Payer: Medicare Other | Admitting: Internal Medicine

## 2014-02-17 DIAGNOSIS — F01518 Vascular dementia, unspecified severity, with other behavioral disturbance: Secondary | ICD-10-CM

## 2014-02-17 DIAGNOSIS — M6281 Muscle weakness (generalized): Secondary | ICD-10-CM | POA: Diagnosis not present

## 2014-02-17 DIAGNOSIS — Z9181 History of falling: Secondary | ICD-10-CM | POA: Diagnosis not present

## 2014-02-17 DIAGNOSIS — J439 Emphysema, unspecified: Secondary | ICD-10-CM | POA: Diagnosis not present

## 2014-02-17 DIAGNOSIS — F0151 Vascular dementia with behavioral disturbance: Secondary | ICD-10-CM

## 2014-02-18 DIAGNOSIS — M6281 Muscle weakness (generalized): Secondary | ICD-10-CM | POA: Diagnosis not present

## 2014-02-18 DIAGNOSIS — Z9181 History of falling: Secondary | ICD-10-CM | POA: Diagnosis not present

## 2014-02-18 DIAGNOSIS — J439 Emphysema, unspecified: Secondary | ICD-10-CM | POA: Diagnosis not present

## 2014-02-21 DIAGNOSIS — J439 Emphysema, unspecified: Secondary | ICD-10-CM | POA: Diagnosis not present

## 2014-02-21 DIAGNOSIS — M6281 Muscle weakness (generalized): Secondary | ICD-10-CM | POA: Diagnosis not present

## 2014-02-21 DIAGNOSIS — Z9181 History of falling: Secondary | ICD-10-CM | POA: Diagnosis not present

## 2014-02-21 NOTE — Progress Notes (Addendum)
Patient ID: Jonathan [name], male   DOB: [birth year], 74 y.o.   MRN: [id number]                PROGRESS NOTE  DATE:  02/16/2014                 [facility]                                LEVEL OF CARE:   SNF   Acute Visit                                CHIEF COMPLAINT:  Review of recurrent cough and congestion.      HISTORY OF PRESENT ILLNESS:  This is a patient whom I readmitted to the facility in early January.  He had just been sent home over Christmas.  I think his wife was unable to look after him.    He has vascular dementia.    He is also a type 2 diabetic.    I have episodically received notifications of coughing, of concern to the family.  In January, I thought he had a viral upper respiratory tract infection.  I have also wondered about aspiration.  I have simply been observing this episodically and I am here to see him today because of this.    REVIEW OF SYSTEMS:   Not really possible from this man.  However, nursing reports that he is still able to walk with his walker.  He appears to be continent of urine.  Walks up to the dining area for his meals.    PHYSICAL EXAMINATION:   VITAL SIGNS:   O2 SATURATIONS:  95% on room air.   RESPIRATIONS:   18 and unlabored.   PULSE:  71.   GENERAL APPEARANCE:  The patient does not look to be in any distress.   CHEST/RESPIRATORY:  Clear air entry bilaterally.    CARDIOVASCULAR:   CARDIAC:  Heart sounds are normal.  He appears to be euvolemic.        GASTROINTESTINAL:   ABDOMEN:  Soft.  There are no masses.  No tenderness.    GENITOURINARY:   BLADDER:  No suprapubic or costovertebral angle tenderness.    ASSESSMENT/PLAN:                                        Vascular dementia.  He appears to be functional in the facility.    I wonder about recurrent aspiration events.  However, right now his lungs are clear.  I see no reason at this point for empiric studies.  If I continue to hear this, I will ask for a Speech  Therapy review.  For now, he seems very stable.   He apparently still is able to ambulate to the bathroom with his walker. I found this somewhat surprising

## 2014-02-22 DIAGNOSIS — D649 Anemia, unspecified: Secondary | ICD-10-CM | POA: Diagnosis not present

## 2014-02-22 DIAGNOSIS — Z79899 Other long term (current) drug therapy: Secondary | ICD-10-CM | POA: Diagnosis not present

## 2014-02-23 ENCOUNTER — Non-Acute Institutional Stay (SKILLED_NURSING_FACILITY): Payer: Medicare Other | Admitting: Internal Medicine

## 2014-02-23 DIAGNOSIS — R260 Ataxic gait: Secondary | ICD-10-CM | POA: Diagnosis not present

## 2014-02-23 DIAGNOSIS — M6281 Muscle weakness (generalized): Secondary | ICD-10-CM | POA: Diagnosis not present

## 2014-02-23 DIAGNOSIS — N10 Acute tubulo-interstitial nephritis: Secondary | ICD-10-CM

## 2014-02-23 DIAGNOSIS — N390 Urinary tract infection, site not specified: Secondary | ICD-10-CM | POA: Diagnosis not present

## 2014-02-23 DIAGNOSIS — R938 Abnormal findings on diagnostic imaging of other specified body structures: Secondary | ICD-10-CM | POA: Diagnosis not present

## 2014-02-23 DIAGNOSIS — R509 Fever, unspecified: Secondary | ICD-10-CM | POA: Diagnosis not present

## 2014-02-23 DIAGNOSIS — R0989 Other specified symptoms and signs involving the circulatory and respiratory systems: Secondary | ICD-10-CM | POA: Diagnosis not present

## 2014-02-23 DIAGNOSIS — Z9181 History of falling: Secondary | ICD-10-CM | POA: Diagnosis not present

## 2014-02-23 DIAGNOSIS — R9389 Abnormal findings on diagnostic imaging of other specified body structures: Secondary | ICD-10-CM

## 2014-02-23 DIAGNOSIS — J439 Emphysema, unspecified: Secondary | ICD-10-CM | POA: Diagnosis not present

## 2014-02-24 DIAGNOSIS — Z9181 History of falling: Secondary | ICD-10-CM | POA: Diagnosis not present

## 2014-02-24 DIAGNOSIS — M6281 Muscle weakness (generalized): Secondary | ICD-10-CM | POA: Diagnosis not present

## 2014-02-24 DIAGNOSIS — J439 Emphysema, unspecified: Secondary | ICD-10-CM | POA: Diagnosis not present

## 2014-02-25 DIAGNOSIS — J439 Emphysema, unspecified: Secondary | ICD-10-CM | POA: Diagnosis not present

## 2014-02-25 DIAGNOSIS — M6281 Muscle weakness (generalized): Secondary | ICD-10-CM | POA: Diagnosis not present

## 2014-02-25 DIAGNOSIS — Z9181 History of falling: Secondary | ICD-10-CM | POA: Diagnosis not present

## 2014-02-26 DIAGNOSIS — J439 Emphysema, unspecified: Secondary | ICD-10-CM | POA: Diagnosis not present

## 2014-02-26 DIAGNOSIS — Z9181 History of falling: Secondary | ICD-10-CM | POA: Diagnosis not present

## 2014-02-26 DIAGNOSIS — M6281 Muscle weakness (generalized): Secondary | ICD-10-CM | POA: Diagnosis not present

## 2014-02-28 DIAGNOSIS — M6281 Muscle weakness (generalized): Secondary | ICD-10-CM | POA: Diagnosis not present

## 2014-02-28 DIAGNOSIS — Z9181 History of falling: Secondary | ICD-10-CM | POA: Diagnosis not present

## 2014-02-28 DIAGNOSIS — J439 Emphysema, unspecified: Secondary | ICD-10-CM | POA: Diagnosis not present

## 2014-02-28 NOTE — Progress Notes (Addendum)
Patient ID: Jonathan [name], male   DOB: Apr 25, 1940, 74 y.o.   MRN: [id number]                PROGRESS NOTE  DATE:  02/23/2014              [facility]                       LEVEL OF CARE:   SNF   Acute Visit   CHIEF COMPLAINT:  Gait ataxia, low-grade fever.    HISTORY OF PRESENT ILLNESS:  I was called two days ago to a report that this patient had had a change in functional status.  He is normally able to walk with his walker.   Apparently, he was leaning to the left, banging into the doorframe.  He no longer seemed able to feed himself.    Yesterday, he apparently had a low-grade temperature.  A chest x-ray was done that showed platelike atelectasis bilaterally, but no other findings.  A urinalysis was done that showed really cloudy urine, but a surprisingly normal urinalysis.  A culture is pending.    LABORATORY DATA:  Lab work shows a slightly elevated white count at 11.3.  His neutrophils were 80%.  Hemoglobin was 12.5, platelet count 510.    Comprehensive metabolic panel was completely normal other than a slightly elevated glucose at 117.  His BUN and creatinine were normal.    Liver function tests normal.    Albumin was 3.6.    REVIEW OF SYSTEMS:   Really not meaningful in this man who really never says that anything is wrong.    PHYSICAL EXAMINATION:   VITAL SIGNS:   O2 SATURATIONS:  94% on room air.   RESPIRATIONS:    18 and unlabored.   PULSE:  88.   GENERAL APPEARANCE:  The patient is seen lying in bed.  He is able to bring himself to a sitting position.   CHEST/RESPIRATORY:  Shallow, but otherwise clear air entry bilaterally.    CARDIOVASCULAR:   CARDIAC:  Heart sounds are normal.  There are no murmurs.   No elevation of jugular venous pressure.  No carotid bruits.    GASTROINTESTINAL:   ABDOMEN:  No masses.  No tenderness.   LIVER/SPLEEN/KIDNEYS:  No liver, no spleen.   GENITOURINARY:   BLADDER:   No suprapubic or costovertebral angle tenderness.      NEUROLOGICAL:    DEEP TENDON REFLEXES:  He is diffusely hyporeflexic.   BALANCE/GAIT:  However, he is able to bring himself to a standing position from a low bed with minimal assistance.  Once he is up, he walks well, turns normally.   PSYCHIATRIC:   MENTAL STATUS:  I see no real difference here.  He has a multi-infarct state.  Somewhat flat affect, but really no evidence of depression or delirium.    ASSESSMENT/PLAN:                                 Gait ataxia two days ago.  The cause here is not really clear.  I suppose it is possible that he has had another stroke/TIA, although it seems to have resolved and I do not see anything at the bedside to really suggest a cause.  Staff report that he is still not eating or feeding himself as well, although I think we should follow this for now.  Low-grade fever yesterday.  A urine culture is pending.  I suppose, given everything else, I will put him on an antibiotic.   He has had issues here before.    Platelike atelectasis bilaterally on his chest x-ray.  At this point, I think there is no evidence that he has pneumonia.  His respiratory exam is really stable at the bedside.  His oxygen saturations are normal.    I am going to put him empirically on an antibiotic while I look for the result of his cloudy urine in spite of a normal urinalysis.  I will continue to monitor him the next time I am in the building.  Ask the staff to report on his status.  If he continues to decline, I will discuss neuroimaging with his family although I am not leaning heavily towards this at the moment.

## 2014-03-01 DIAGNOSIS — M6281 Muscle weakness (generalized): Secondary | ICD-10-CM | POA: Diagnosis not present

## 2014-03-01 DIAGNOSIS — J439 Emphysema, unspecified: Secondary | ICD-10-CM | POA: Diagnosis not present

## 2014-03-01 DIAGNOSIS — Z9181 History of falling: Secondary | ICD-10-CM | POA: Diagnosis not present

## 2014-03-03 DIAGNOSIS — Z9181 History of falling: Secondary | ICD-10-CM | POA: Diagnosis not present

## 2014-03-03 DIAGNOSIS — J439 Emphysema, unspecified: Secondary | ICD-10-CM | POA: Diagnosis not present

## 2014-03-03 DIAGNOSIS — M6281 Muscle weakness (generalized): Secondary | ICD-10-CM | POA: Diagnosis not present

## 2014-03-04 DIAGNOSIS — M6281 Muscle weakness (generalized): Secondary | ICD-10-CM | POA: Diagnosis not present

## 2014-03-04 DIAGNOSIS — J439 Emphysema, unspecified: Secondary | ICD-10-CM | POA: Diagnosis not present

## 2014-03-04 DIAGNOSIS — Z9181 History of falling: Secondary | ICD-10-CM | POA: Diagnosis not present

## 2014-03-05 DIAGNOSIS — Z9181 History of falling: Secondary | ICD-10-CM | POA: Diagnosis not present

## 2014-03-05 DIAGNOSIS — M6281 Muscle weakness (generalized): Secondary | ICD-10-CM | POA: Diagnosis not present

## 2014-03-05 DIAGNOSIS — J439 Emphysema, unspecified: Secondary | ICD-10-CM | POA: Diagnosis not present

## 2014-03-07 DIAGNOSIS — M6281 Muscle weakness (generalized): Secondary | ICD-10-CM | POA: Diagnosis not present

## 2014-03-07 DIAGNOSIS — Z9181 History of falling: Secondary | ICD-10-CM | POA: Diagnosis not present

## 2014-03-07 DIAGNOSIS — J439 Emphysema, unspecified: Secondary | ICD-10-CM | POA: Diagnosis not present

## 2014-03-08 DIAGNOSIS — Z9181 History of falling: Secondary | ICD-10-CM | POA: Diagnosis not present

## 2014-03-09 DIAGNOSIS — Z9181 History of falling: Secondary | ICD-10-CM | POA: Diagnosis not present

## 2014-03-10 DIAGNOSIS — Z9181 History of falling: Secondary | ICD-10-CM | POA: Diagnosis not present

## 2014-03-11 DIAGNOSIS — Z9181 History of falling: Secondary | ICD-10-CM | POA: Diagnosis not present

## 2014-03-14 DIAGNOSIS — Z9181 History of falling: Secondary | ICD-10-CM | POA: Diagnosis not present

## 2014-03-15 DIAGNOSIS — Z9181 History of falling: Secondary | ICD-10-CM | POA: Diagnosis not present

## 2014-03-16 DIAGNOSIS — Z9181 History of falling: Secondary | ICD-10-CM | POA: Diagnosis not present

## 2014-03-17 DIAGNOSIS — Z9181 History of falling: Secondary | ICD-10-CM | POA: Diagnosis not present

## 2014-03-18 DIAGNOSIS — Z9181 History of falling: Secondary | ICD-10-CM | POA: Diagnosis not present

## 2014-04-14 DIAGNOSIS — R41841 Cognitive communication deficit: Secondary | ICD-10-CM | POA: Diagnosis not present

## 2014-04-14 DIAGNOSIS — Z9181 History of falling: Secondary | ICD-10-CM | POA: Diagnosis not present

## 2014-04-15 DIAGNOSIS — R41841 Cognitive communication deficit: Secondary | ICD-10-CM | POA: Diagnosis not present

## 2014-04-15 DIAGNOSIS — Z9181 History of falling: Secondary | ICD-10-CM | POA: Diagnosis not present

## 2014-04-16 DIAGNOSIS — R41841 Cognitive communication deficit: Secondary | ICD-10-CM | POA: Diagnosis not present

## 2014-04-16 DIAGNOSIS — Z9181 History of falling: Secondary | ICD-10-CM | POA: Diagnosis not present

## 2014-04-17 DIAGNOSIS — R41841 Cognitive communication deficit: Secondary | ICD-10-CM | POA: Diagnosis not present

## 2014-04-17 DIAGNOSIS — Z9181 History of falling: Secondary | ICD-10-CM | POA: Diagnosis not present

## 2014-04-18 DIAGNOSIS — R41841 Cognitive communication deficit: Secondary | ICD-10-CM | POA: Diagnosis not present

## 2014-04-18 DIAGNOSIS — Z9181 History of falling: Secondary | ICD-10-CM | POA: Diagnosis not present

## 2014-04-19 DIAGNOSIS — R41841 Cognitive communication deficit: Secondary | ICD-10-CM | POA: Diagnosis not present

## 2014-04-19 DIAGNOSIS — Z9181 History of falling: Secondary | ICD-10-CM | POA: Diagnosis not present

## 2014-04-20 DIAGNOSIS — Z9181 History of falling: Secondary | ICD-10-CM | POA: Diagnosis not present

## 2014-04-20 DIAGNOSIS — R41841 Cognitive communication deficit: Secondary | ICD-10-CM | POA: Diagnosis not present

## 2014-04-22 DIAGNOSIS — R41841 Cognitive communication deficit: Secondary | ICD-10-CM | POA: Diagnosis not present

## 2014-04-22 DIAGNOSIS — Z9181 History of falling: Secondary | ICD-10-CM | POA: Diagnosis not present

## 2014-04-25 DIAGNOSIS — R41841 Cognitive communication deficit: Secondary | ICD-10-CM | POA: Diagnosis not present

## 2014-04-25 DIAGNOSIS — Z9181 History of falling: Secondary | ICD-10-CM | POA: Diagnosis not present

## 2014-04-26 DIAGNOSIS — R41841 Cognitive communication deficit: Secondary | ICD-10-CM | POA: Diagnosis not present

## 2014-04-26 DIAGNOSIS — Z9181 History of falling: Secondary | ICD-10-CM | POA: Diagnosis not present

## 2014-04-27 DIAGNOSIS — R41841 Cognitive communication deficit: Secondary | ICD-10-CM | POA: Diagnosis not present

## 2014-04-27 DIAGNOSIS — Z9181 History of falling: Secondary | ICD-10-CM | POA: Diagnosis not present

## 2014-04-28 DIAGNOSIS — Z9181 History of falling: Secondary | ICD-10-CM | POA: Diagnosis not present

## 2014-04-28 DIAGNOSIS — R41841 Cognitive communication deficit: Secondary | ICD-10-CM | POA: Diagnosis not present

## 2014-04-29 DIAGNOSIS — R41841 Cognitive communication deficit: Secondary | ICD-10-CM | POA: Diagnosis not present

## 2014-04-29 DIAGNOSIS — Z9181 History of falling: Secondary | ICD-10-CM | POA: Diagnosis not present

## 2014-05-02 DIAGNOSIS — R41841 Cognitive communication deficit: Secondary | ICD-10-CM | POA: Diagnosis not present

## 2014-05-02 DIAGNOSIS — Z9181 History of falling: Secondary | ICD-10-CM | POA: Diagnosis not present

## 2014-05-03 DIAGNOSIS — Z9181 History of falling: Secondary | ICD-10-CM | POA: Diagnosis not present

## 2014-05-03 DIAGNOSIS — R41841 Cognitive communication deficit: Secondary | ICD-10-CM | POA: Diagnosis not present

## 2014-05-04 DIAGNOSIS — R41841 Cognitive communication deficit: Secondary | ICD-10-CM | POA: Diagnosis not present

## 2014-05-04 DIAGNOSIS — Z9181 History of falling: Secondary | ICD-10-CM | POA: Diagnosis not present

## 2014-05-05 DIAGNOSIS — R41841 Cognitive communication deficit: Secondary | ICD-10-CM | POA: Diagnosis not present

## 2014-05-05 DIAGNOSIS — Z9181 History of falling: Secondary | ICD-10-CM | POA: Diagnosis not present

## 2014-05-06 DIAGNOSIS — Z9181 History of falling: Secondary | ICD-10-CM | POA: Diagnosis not present

## 2014-05-06 DIAGNOSIS — R41841 Cognitive communication deficit: Secondary | ICD-10-CM | POA: Diagnosis not present

## 2014-05-11 DIAGNOSIS — R278 Other lack of coordination: Secondary | ICD-10-CM | POA: Diagnosis not present

## 2014-05-13 DIAGNOSIS — R278 Other lack of coordination: Secondary | ICD-10-CM | POA: Diagnosis not present

## 2014-05-16 DIAGNOSIS — R278 Other lack of coordination: Secondary | ICD-10-CM | POA: Diagnosis not present

## 2014-05-20 DIAGNOSIS — R278 Other lack of coordination: Secondary | ICD-10-CM | POA: Diagnosis not present

## 2014-05-23 DIAGNOSIS — R278 Other lack of coordination: Secondary | ICD-10-CM | POA: Diagnosis not present

## 2014-05-25 DIAGNOSIS — E119 Type 2 diabetes mellitus without complications: Secondary | ICD-10-CM | POA: Diagnosis not present

## 2014-06-03 DIAGNOSIS — N390 Urinary tract infection, site not specified: Secondary | ICD-10-CM | POA: Diagnosis not present

## 2014-06-08 ENCOUNTER — Non-Acute Institutional Stay (SKILLED_NURSING_FACILITY): Payer: Medicare Other | Admitting: Internal Medicine

## 2014-06-08 DIAGNOSIS — F0151 Vascular dementia with behavioral disturbance: Secondary | ICD-10-CM | POA: Diagnosis not present

## 2014-06-08 DIAGNOSIS — F01518 Vascular dementia, unspecified severity, with other behavioral disturbance: Secondary | ICD-10-CM

## 2014-06-08 DIAGNOSIS — E1142 Type 2 diabetes mellitus with diabetic polyneuropathy: Secondary | ICD-10-CM | POA: Diagnosis not present

## 2014-06-08 DIAGNOSIS — R634 Abnormal weight loss: Secondary | ICD-10-CM

## 2014-06-10 DIAGNOSIS — M7989 Other specified soft tissue disorders: Secondary | ICD-10-CM | POA: Diagnosis not present

## 2014-06-10 NOTE — Progress Notes (Addendum)
Patient ID: Jonathan [name], male   DOB: [birth year], 74 y.o.   MRN: [id number]                PROGRESS NOTE  DATE:  06/08/2014         FACILITY: [facility]           LEVEL OF CARE:   SNF   Routine Visit                CHIEF COMPLAINT:  Routine visit/medical issues.       HISTORY OF PRESENT ILLNESS:  Mr. Dorrance is a gentleman who came to [place] in 2015 after being transferred from [facility] skilled unit.  I think this was largely due to wandering.    He has severe vascular dementia, type 2 diabetes with neuropathy.    He still walks with his walker in the room, somewhat unusual for this degree of dementia.    PAST MEDICAL HISTORY/PROBLEM LIST:           Hospitalization in 2015 for E.coli pyelonephritis.    History of bilateral nephrolithiasis.    Mechanical falls.    Hypomagnesemia.    Vascular dementia.    Type 2 diabetes.     On oral agents.    Hypertension.    Hyperlipidemia.    History of COPD.    CURRENT MEDICATIONS:  Medication list is reviewed.                  Norvasc 10 q.d.      Enteric-coated aspirin 81 q.d.         Ferrex 150 b.i.d.        Aricept 10 q.d.                  Vitamin B12 monthly.       Metformin 1000 b.i.d.         Actos 30 q.d.        LABORATORY DATA:   Recent lab work includes a urine for C&S on 06/03/2014 which showed 1000 colonies of Enterobacter, non-significant growth.  I am assuming that this was done for behavioral issues, although I am not completely certain.     Last lab work was in February at which time white count was 11.3, hemoglobin 12.5, platelet count 510.     Comprehensive metabolic panel was normal.  Sodium was 139, potassium 4.4, carbon dioxide 21, albumin 3.6.    PHYSICAL EXAMINATION:   GENERAL APPEARANCE:  The patient is not in any distress.    He talks in a very soft monotone, difficult to understand.   CHEST/RESPIRATORY:  Clear air entry bilaterally.    CARDIOVASCULAR:   CARDIAC:  Heart  sounds are normal.  I would wonder about some degree of volume deficiency.        GASTROINTESTINAL:   ABDOMEN:  ?Weight loss.  No masses.    LIVER/SPLEEN/KIDNEYS:  No liver, no spleen.  No tenderness.      CIRCULATION:   EDEMA/VARICOSITIES:  Extremities:   No evidence of a DVT.      ASSESSMENT/PLAN:                                  Vascular dementia.  This is quite significant.  I cannot really understand him enough to really gauge this, although I find it somewhat unusual that he still gets up with a walker to go  to the bathroom.      ?Weight loss.    I will monitor him when I am next in the building.  Lab work is indicated.    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  I will check a hemoglobin A1c on him.   He remains on metformin and Actos.      Behavioral disturbances.  I am assuming this is why a recent urine for C&S was done.  He does have a history of nephrolithiasis.  I do not see a recent urinalysis.      CPT CODE: [id number]    ADDENDUM:    His hemoglobin A1c was 6.2 on 05/25/2014.  I think the Actos can probably discontinue.  This is superfluous for a patient in this situation

## 2014-06-11 DIAGNOSIS — M25551 Pain in right hip: Secondary | ICD-10-CM | POA: Diagnosis not present

## 2014-06-20 DIAGNOSIS — Z79899 Other long term (current) drug therapy: Secondary | ICD-10-CM | POA: Diagnosis not present

## 2014-06-20 DIAGNOSIS — D649 Anemia, unspecified: Secondary | ICD-10-CM | POA: Diagnosis not present

## 2014-06-21 DIAGNOSIS — Z9181 History of falling: Secondary | ICD-10-CM | POA: Diagnosis not present

## 2014-06-22 ENCOUNTER — Non-Acute Institutional Stay (SKILLED_NURSING_FACILITY): Payer: Medicare Other | Admitting: Internal Medicine

## 2014-06-22 DIAGNOSIS — R634 Abnormal weight loss: Secondary | ICD-10-CM

## 2014-06-22 DIAGNOSIS — R260 Ataxic gait: Secondary | ICD-10-CM | POA: Diagnosis not present

## 2014-06-22 DIAGNOSIS — K120 Recurrent oral aphthae: Secondary | ICD-10-CM | POA: Diagnosis not present

## 2014-06-22 DIAGNOSIS — Z9181 History of falling: Secondary | ICD-10-CM | POA: Diagnosis not present

## 2014-06-23 DIAGNOSIS — Z9181 History of falling: Secondary | ICD-10-CM | POA: Diagnosis not present

## 2014-06-24 DIAGNOSIS — Z9181 History of falling: Secondary | ICD-10-CM | POA: Diagnosis not present

## 2014-06-25 DIAGNOSIS — Z9181 History of falling: Secondary | ICD-10-CM | POA: Diagnosis not present

## 2014-06-25 NOTE — Progress Notes (Addendum)
Patient ID: Jonathan [name], male   DOB: December 01, 1940, 74 y.o.   MRN: [id number]                PROGRESS NOTE  DATE:  06/22/2014            FACILITY: [facility]                          LEVEL OF CARE:   SNF   Acute Visit                     CHIEF COMPLAINT:  Follow up falls.       HISTORY OF PRESENT ILLNESS:  I saw Jonathan [name] in routine visitation two weeks ago.  I wondered about weight loss.  His wife, who is present as I am seeing him today, is also concerned about this.    He also seems to have had a series of falls while I have been on vacation.  He had an x-ray of the right hip and pelvis and also the left shoulder.  The left shoulder showed no bony abnormalities.  The right hip showed no fractures.    In discussing things with his wife, she says that his balance is very poor.  He has known visual loss.  Was supposed to wear glasses, but would not keep them on at home.  Therefore, she has not brought them in here.  According to his wife also, he has cataracts although they were not ready for surgery.  He has an eye doctor appointment in 2-3 weeks in follow-up.    LABORATORY DATA:  Lab work that I ordered last time was really unremarkable.    CBC showed a slightly elevated white count at 12.  However, the differential count was normal.  Hemoglobin was 12.8, platelets 512.    Comprehensive metabolic panel was completely normal including a sodium of 142, potassium 4.6, CO2 of 24, albumin 3.7.  BUN and creatinine are 23 and 1.02, respectively.    Liver function tests are normal.    REVIEW OF SYSTEMS:   Really not possible from the patient.   HEENT:  He has developed a large lip ulceration on the anterior lip.  I have been asked to look at this, as well.    PHYSICAL EXAMINATION:   GENERAL APPEARANCE:  The patient does not look in any distress.   HEENT:         MOUTH/THROAT:  He does indeed have a large aphthous ulcer on the anterior part of the lower lip.  He may have had some  trauma with his two remaining teeth, as well.   CHEST/RESPIRATORY:  Clear air entry bilaterally.    CARDIOVASCULAR:   CARDIAC:  Heart sounds are normal.  There are no murmurs.     ASSESSMENT/PLAN:                             Weight loss.  I will check into this.  His lab work looked normal.    Type 2 diabetes.  I stopped his Actos two weeks ago due to very stable blood sugars.    Recent falls.  His gait is very unstable.  He does not do a lot of ambulation.  His wife states he has very poor balance.  I will ask for a physical therapy review here.  He has a walker and, at least  for a while, has been using it properly.    Oral aphthous ulcer.  This is actually fairly large, probably around 5 mm.  I am going to give this a steroid covered by [employer].  Follow for clinical resolution.

## 2014-06-27 DIAGNOSIS — Z9181 History of falling: Secondary | ICD-10-CM | POA: Diagnosis not present

## 2014-06-28 DIAGNOSIS — Z9181 History of falling: Secondary | ICD-10-CM | POA: Diagnosis not present

## 2014-06-29 DIAGNOSIS — Z9181 History of falling: Secondary | ICD-10-CM | POA: Diagnosis not present

## 2014-06-30 DIAGNOSIS — Z9181 History of falling: Secondary | ICD-10-CM | POA: Diagnosis not present

## 2014-07-01 DIAGNOSIS — Z9181 History of falling: Secondary | ICD-10-CM | POA: Diagnosis not present

## 2014-07-03 DIAGNOSIS — Z9181 History of falling: Secondary | ICD-10-CM | POA: Diagnosis not present

## 2014-07-04 ENCOUNTER — Other Ambulatory Visit: Payer: Self-pay

## 2014-07-05 DIAGNOSIS — Z9181 History of falling: Secondary | ICD-10-CM | POA: Diagnosis not present

## 2014-07-06 DIAGNOSIS — Z9181 History of falling: Secondary | ICD-10-CM | POA: Diagnosis not present

## 2014-07-07 DIAGNOSIS — Z9181 History of falling: Secondary | ICD-10-CM | POA: Diagnosis not present

## 2014-07-07 DIAGNOSIS — J9811 Atelectasis: Secondary | ICD-10-CM | POA: Diagnosis not present

## 2014-07-08 DIAGNOSIS — R0602 Shortness of breath: Secondary | ICD-10-CM | POA: Diagnosis not present

## 2014-07-08 DIAGNOSIS — D649 Anemia, unspecified: Secondary | ICD-10-CM | POA: Diagnosis not present

## 2014-07-09 ENCOUNTER — Other Ambulatory Visit
Admission: RE | Admit: 2014-07-09 | Discharge: 2014-07-09 | Disposition: A | Discharge status: Home / Self Care | Payer: Medicare Other | Source: Ambulatory Visit | Attending: Physician Assistant | Admitting: Physician Assistant

## 2014-07-09 DIAGNOSIS — E119 Type 2 diabetes mellitus without complications: Secondary | ICD-10-CM | POA: Insufficient documentation

## 2014-07-09 DIAGNOSIS — Z9181 History of falling: Secondary | ICD-10-CM | POA: Diagnosis not present

## 2014-07-09 DIAGNOSIS — Z79899 Other long term (current) drug therapy: Secondary | ICD-10-CM | POA: Diagnosis not present

## 2014-07-09 DIAGNOSIS — I129 Hypertensive chronic kidney disease with stage 1 through stage 4 chronic kidney disease, or unspecified chronic kidney disease: Secondary | ICD-10-CM | POA: Insufficient documentation

## 2014-07-09 DIAGNOSIS — N189 Chronic kidney disease, unspecified: Secondary | ICD-10-CM | POA: Diagnosis not present

## 2014-07-09 LAB — BASIC METABOLIC PANEL
Anion gap: 16 — ABNORMAL HIGH (ref 5–15)
BUN: 25 mg/dL — AB (ref 6–20)
CALCIUM: 8.9 mg/dL (ref 8.9–10.3)
CHLORIDE: 104 mmol/L (ref 101–111)
CO2: 21 mmol/L — AB (ref 22–32)
Creatinine, Ser: 1.2 mg/dL (ref 0.61–1.24)
GFR calc non Af Amer: 59 mL/min — ABNORMAL LOW (ref >60)
Glucose, Bld: 131 mg/dL — ABNORMAL HIGH (ref 65–99)
POTASSIUM: 3.9 mmol/L (ref 3.5–5.1)
SODIUM: 141 mmol/L (ref 135–145)

## 2014-07-10 ENCOUNTER — Non-Acute Institutional Stay (SKILLED_NURSING_FACILITY): Payer: Medicare Other | Admitting: Internal Medicine

## 2014-07-10 DIAGNOSIS — F0151 Vascular dementia with behavioral disturbance: Secondary | ICD-10-CM

## 2014-07-10 DIAGNOSIS — J441 Chronic obstructive pulmonary disease with (acute) exacerbation: Secondary | ICD-10-CM | POA: Diagnosis not present

## 2014-07-10 DIAGNOSIS — J440 Chronic obstructive pulmonary disease with acute lower respiratory infection: Principal | ICD-10-CM

## 2014-07-10 DIAGNOSIS — F01518 Vascular dementia, unspecified severity, with other behavioral disturbance: Secondary | ICD-10-CM

## 2014-07-10 DIAGNOSIS — J209 Acute bronchitis, unspecified: Secondary | ICD-10-CM

## 2014-07-10 NOTE — Progress Notes (Signed)
Patient ID: Jonathan [name], male   DOB: May 09, 1940, 74 y.o.   MRN: [id number] [facility] Chief complaint; fever/respiratory distress History; this is a patient that I admitted to the building in October 2015 coming from another nursing home. He has fairly severe vascular dementia which is been progressive over the last 7 months or so. He is also a type II diabetic on oral agents with diabetic neuropathy. I was called 3 days ago to report of fever of over 101 respiratory distress and hypoxemia with O2 sats in the low 80s. Per description of the nurse he had expiratory wheezing and inspiratory rhonchi. Chest x-ray showed left base atelectasis with no edema or effusion. He was treated with regular and nebulizers i.e. duo nebs every 6 hours, emperically biotics Rocephin and Levaquin, and oxygen at 2 L. Was not given steroids. He seems to have made a decent recovery I'm seeing him today urgently. Today it is reported that he is not struggling to breathe, he is afebrile and knee 8 all of his breakfast with assistance. He has a known past medical history of COPD.  Past Medical History  Diagnosis Date  . ED (erectile dysfunction)   . Vitamin D deficiency   . Pancreatitis   . CAD (coronary artery disease)     cath 2001- one vessel occluded (circ); others patent.  EF 55%  . B12 deficiency 06/02/10    174  . Obesity   . Chronic bronchitis   . Emphysema of lung   . Blood transfusion   . Asthma   . Anemia, iron deficiency 06/02/10    heme negative, ferritin 5  . Diabetes mellitus   . GERD (gastroesophageal reflux disease)   . Hyperlipidemia   . Hypertension   . Nephrolithiasis   . Diverticulosis   . H. pylori infection 05/08/2010  . Hiatal hernia 05/08/2010    EGD  . Gastritis 05/08/2010    EGD  . Smoker   . Dementia 10/19/2012   Current medications Norvasc 10 daily Enteric-coated aspirin 81 daily Fair ex FE RR DX 150 twice a day Aricept 10 daily Vitamin B12 at thousand  monthly Metformin at thousand twice a day Duo nebs every 6 routinely and every 2 when necessary Was given Rocephin and Levaquin for 5 days which should finish on 07/12/14  Review of systems; not possible from the patient however the staff seems to think he is doing better this morning ate his entire breakfast and drank well.  Physical examination Gen. patient is not in any distress Vitals; O2 sat is 95% [oxygen is off not sure for how long] pulse rate 82 respirations 20 with mild tracheal tug Respiratory; mildly shallow air entry bilaterally however there is no wheezing no rhonchi. Cardiac; heart sounds are normal JVP is not elevated there is no S4 Abdomen soft without liver spleen or masses GU no suprapubic or costovertebral angle tenderness no bladder distention Extremities; no edema and no evidence of a DVT  Impression/plan #1 COPD acute this seems to be improving he is now afebrile and satting well on room air. Still mild accessory muscle use. I will continue with her regular nebulizers. He will complete his anti-biotics on 7/5 in all see him when I'm next in the facility. This could've also been an aspiration event he has been declining from his moderate to severe multi-infarct dementia which would certainly put him at risk for this. #2 known history of coronary artery disease; there is no way to get information  of the patient however this does not seem to be a playing a major issue in this acute event.  The patient's oxygen can be tapered to off as he seems to be maintaining his saturations on room air. Anti-biotics as noted.

## 2014-07-11 DIAGNOSIS — Z9181 History of falling: Secondary | ICD-10-CM | POA: Diagnosis not present

## 2014-07-12 DIAGNOSIS — Z9181 History of falling: Secondary | ICD-10-CM | POA: Diagnosis not present

## 2014-07-12 DIAGNOSIS — H2513 Age-related nuclear cataract, bilateral: Secondary | ICD-10-CM | POA: Diagnosis not present

## 2014-07-12 DIAGNOSIS — E119 Type 2 diabetes mellitus without complications: Secondary | ICD-10-CM | POA: Diagnosis not present

## 2014-07-13 DIAGNOSIS — Z9181 History of falling: Secondary | ICD-10-CM | POA: Diagnosis not present

## 2014-07-14 DIAGNOSIS — Z9181 History of falling: Secondary | ICD-10-CM | POA: Diagnosis not present

## 2014-07-15 DIAGNOSIS — Z9181 History of falling: Secondary | ICD-10-CM | POA: Diagnosis not present

## 2014-07-19 DIAGNOSIS — R0989 Other specified symptoms and signs involving the circulatory and respiratory systems: Secondary | ICD-10-CM | POA: Diagnosis not present

## 2014-07-19 DIAGNOSIS — R05 Cough: Secondary | ICD-10-CM | POA: Diagnosis not present

## 2014-07-20 ENCOUNTER — Non-Acute Institutional Stay (SKILLED_NURSING_FACILITY): Payer: Medicare Other | Admitting: Internal Medicine

## 2014-07-20 DIAGNOSIS — D649 Anemia, unspecified: Secondary | ICD-10-CM | POA: Diagnosis not present

## 2014-07-20 DIAGNOSIS — F0151 Vascular dementia with behavioral disturbance: Secondary | ICD-10-CM | POA: Diagnosis not present

## 2014-07-20 DIAGNOSIS — J440 Chronic obstructive pulmonary disease with acute lower respiratory infection: Principal | ICD-10-CM

## 2014-07-20 DIAGNOSIS — B9789 Other viral agents as the cause of diseases classified elsewhere: Secondary | ICD-10-CM

## 2014-07-20 DIAGNOSIS — Z79899 Other long term (current) drug therapy: Secondary | ICD-10-CM | POA: Diagnosis not present

## 2014-07-20 DIAGNOSIS — J069 Acute upper respiratory infection, unspecified: Secondary | ICD-10-CM

## 2014-07-20 DIAGNOSIS — F01518 Vascular dementia, unspecified severity, with other behavioral disturbance: Secondary | ICD-10-CM

## 2014-07-20 DIAGNOSIS — J441 Chronic obstructive pulmonary disease with (acute) exacerbation: Secondary | ICD-10-CM | POA: Diagnosis not present

## 2014-07-20 DIAGNOSIS — J209 Acute bronchitis, unspecified: Secondary | ICD-10-CM

## 2014-07-20 NOTE — Progress Notes (Signed)
Patient ID: Jonathan [name], male   DOB: [birth year], 74 y.o.   MRN: [id number] [facility] Chief complaint; fever History 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 1 weeks ago I saw him for what i thought was aspiration pneumonitis/COPD acute to. I gave him a course of anti-biotics and he appeared to stabilize. Last saw him on 7/3 any really looked well. Apparently spiked a temperature to 102-1/2 last night. A chest x-ray was ordered that showed no infiltrate. Work was ordered although I don't think this is been sent including a blood culture. Temperature is down to 100.2  Review of systems is not really possible in this man did the advanced state of his dimension  Physical examination Gen. he is not in obvious respiratory distress Vitals temperature 100.2 blood pressure 132/78 pulse 78 respirations 20 O2 sat is 94% on room air weight is 142 pounds Respiratory; he has a loose cough but his air entry is actually quite good there is no wheezing. There is no accessory muscle use. Cardiac; his heart sounds are inoperable due to upper airway noise however there is not not appear to be any evidence of heart failure Streck is pressure is not elevated Abdomen; no abdominal tenderness is noted no right upper quadrant tenderness. No masses. GU there is no suprapubic or costovertebral angle tenderness. Skin; no ulcerations in the usual areas are seen  Impression/plan #1 once again a febrile illness that really seems to be pulmonary/upper airway. His 2 view chest x-ray however did not show an infiltrate. I suspect he is aspirating area #2 history of asthma or asthmatic bronchitis. When he had this issue 2 weeks ago. There was quite a lot of bronchospasm. I don't see this now. #3 given anti-biotics 2 weeks ago yet there is no obvious diarrhea. Nevertheless I'll have the staff be vigilant about this line #4 question recurrent aspiration. This really seems  the most likely explanation for this. I'm going to give him a routine nebulizers Augmentin. I would like him upright for all meals

## 2014-07-21 DIAGNOSIS — R7881 Bacteremia: Secondary | ICD-10-CM | POA: Diagnosis not present

## 2014-07-21 DIAGNOSIS — N390 Urinary tract infection, site not specified: Secondary | ICD-10-CM | POA: Diagnosis not present

## 2014-07-25 DIAGNOSIS — Z9181 History of falling: Secondary | ICD-10-CM | POA: Diagnosis not present

## 2014-07-25 DIAGNOSIS — R1319 Other dysphagia: Secondary | ICD-10-CM | POA: Diagnosis not present

## 2014-07-25 DIAGNOSIS — R269 Unspecified abnormalities of gait and mobility: Secondary | ICD-10-CM | POA: Diagnosis not present

## 2014-07-26 DIAGNOSIS — Z9181 History of falling: Secondary | ICD-10-CM | POA: Diagnosis not present

## 2014-07-26 DIAGNOSIS — R1319 Other dysphagia: Secondary | ICD-10-CM | POA: Diagnosis not present

## 2014-07-26 DIAGNOSIS — R269 Unspecified abnormalities of gait and mobility: Secondary | ICD-10-CM | POA: Diagnosis not present

## 2014-07-27 DIAGNOSIS — Z9181 History of falling: Secondary | ICD-10-CM | POA: Diagnosis not present

## 2014-07-27 DIAGNOSIS — R269 Unspecified abnormalities of gait and mobility: Secondary | ICD-10-CM | POA: Diagnosis not present

## 2014-07-27 DIAGNOSIS — R1319 Other dysphagia: Secondary | ICD-10-CM | POA: Diagnosis not present

## 2014-07-28 DIAGNOSIS — R269 Unspecified abnormalities of gait and mobility: Secondary | ICD-10-CM | POA: Diagnosis not present

## 2014-07-28 DIAGNOSIS — Z9181 History of falling: Secondary | ICD-10-CM | POA: Diagnosis not present

## 2014-07-28 DIAGNOSIS — R1319 Other dysphagia: Secondary | ICD-10-CM | POA: Diagnosis not present

## 2014-07-29 DIAGNOSIS — R1319 Other dysphagia: Secondary | ICD-10-CM | POA: Diagnosis not present

## 2014-07-29 DIAGNOSIS — R269 Unspecified abnormalities of gait and mobility: Secondary | ICD-10-CM | POA: Diagnosis not present

## 2014-07-29 DIAGNOSIS — Z9181 History of falling: Secondary | ICD-10-CM | POA: Diagnosis not present

## 2014-08-02 DIAGNOSIS — R1319 Other dysphagia: Secondary | ICD-10-CM | POA: Diagnosis not present

## 2014-08-02 DIAGNOSIS — Z9181 History of falling: Secondary | ICD-10-CM | POA: Diagnosis not present

## 2014-08-02 DIAGNOSIS — R269 Unspecified abnormalities of gait and mobility: Secondary | ICD-10-CM | POA: Diagnosis not present

## 2014-08-03 DIAGNOSIS — R1319 Other dysphagia: Secondary | ICD-10-CM | POA: Diagnosis not present

## 2014-08-03 DIAGNOSIS — Z9181 History of falling: Secondary | ICD-10-CM | POA: Diagnosis not present

## 2014-08-03 DIAGNOSIS — R269 Unspecified abnormalities of gait and mobility: Secondary | ICD-10-CM | POA: Diagnosis not present

## 2014-08-04 DIAGNOSIS — R1319 Other dysphagia: Secondary | ICD-10-CM | POA: Diagnosis not present

## 2014-08-04 DIAGNOSIS — R269 Unspecified abnormalities of gait and mobility: Secondary | ICD-10-CM | POA: Diagnosis not present

## 2014-08-04 DIAGNOSIS — Z9181 History of falling: Secondary | ICD-10-CM | POA: Diagnosis not present

## 2014-08-05 DIAGNOSIS — R1319 Other dysphagia: Secondary | ICD-10-CM | POA: Diagnosis not present

## 2014-08-05 DIAGNOSIS — R269 Unspecified abnormalities of gait and mobility: Secondary | ICD-10-CM | POA: Diagnosis not present

## 2014-08-05 DIAGNOSIS — Z9181 History of falling: Secondary | ICD-10-CM | POA: Diagnosis not present

## 2014-08-06 DIAGNOSIS — R1319 Other dysphagia: Secondary | ICD-10-CM | POA: Diagnosis not present

## 2014-08-06 DIAGNOSIS — R269 Unspecified abnormalities of gait and mobility: Secondary | ICD-10-CM | POA: Diagnosis not present

## 2014-08-06 DIAGNOSIS — Z9181 History of falling: Secondary | ICD-10-CM | POA: Diagnosis not present

## 2014-08-07 DIAGNOSIS — R1319 Other dysphagia: Secondary | ICD-10-CM | POA: Diagnosis not present

## 2014-08-07 DIAGNOSIS — R269 Unspecified abnormalities of gait and mobility: Secondary | ICD-10-CM | POA: Diagnosis not present

## 2014-08-07 DIAGNOSIS — Z9181 History of falling: Secondary | ICD-10-CM | POA: Diagnosis not present

## 2014-08-08 DIAGNOSIS — Z9181 History of falling: Secondary | ICD-10-CM | POA: Diagnosis not present

## 2014-08-08 DIAGNOSIS — R1319 Other dysphagia: Secondary | ICD-10-CM | POA: Diagnosis not present

## 2014-08-08 DIAGNOSIS — R269 Unspecified abnormalities of gait and mobility: Secondary | ICD-10-CM | POA: Diagnosis not present

## 2014-09-08 DIAGNOSIS — Z9181 History of falling: Secondary | ICD-10-CM | POA: Diagnosis not present

## 2014-09-08 DIAGNOSIS — M6281 Muscle weakness (generalized): Secondary | ICD-10-CM | POA: Diagnosis not present

## 2014-09-09 DIAGNOSIS — Z9181 History of falling: Secondary | ICD-10-CM | POA: Diagnosis not present

## 2014-09-09 DIAGNOSIS — M6281 Muscle weakness (generalized): Secondary | ICD-10-CM | POA: Diagnosis not present

## 2014-09-12 DIAGNOSIS — M6281 Muscle weakness (generalized): Secondary | ICD-10-CM | POA: Diagnosis not present

## 2014-09-12 DIAGNOSIS — Z9181 History of falling: Secondary | ICD-10-CM | POA: Diagnosis not present

## 2014-09-27 ENCOUNTER — Encounter (HOSPITAL_COMMUNITY): Payer: Self-pay | Admitting: Emergency Medicine

## 2014-09-27 ENCOUNTER — Emergency Department (HOSPITAL_COMMUNITY)
Admission: EM | Admit: 2014-09-27 | Discharge: 2014-09-27 | Disposition: A | Discharge status: Home / Self Care | Payer: Medicare Other | Attending: Emergency Medicine | Admitting: Emergency Medicine

## 2014-09-27 ENCOUNTER — Emergency Department (HOSPITAL_COMMUNITY): Payer: Medicare Other

## 2014-09-27 DIAGNOSIS — Y9389 Activity, other specified: Secondary | ICD-10-CM | POA: Diagnosis not present

## 2014-09-27 DIAGNOSIS — Y92129 Unspecified place in nursing home as the place of occurrence of the external cause: Secondary | ICD-10-CM | POA: Diagnosis not present

## 2014-09-27 DIAGNOSIS — E119 Type 2 diabetes mellitus without complications: Secondary | ICD-10-CM | POA: Insufficient documentation

## 2014-09-27 DIAGNOSIS — Z7982 Long term (current) use of aspirin: Secondary | ICD-10-CM | POA: Diagnosis not present

## 2014-09-27 DIAGNOSIS — Y998 Other external cause status: Secondary | ICD-10-CM | POA: Diagnosis not present

## 2014-09-27 DIAGNOSIS — E669 Obesity, unspecified: Secondary | ICD-10-CM | POA: Insufficient documentation

## 2014-09-27 DIAGNOSIS — Z87891 Personal history of nicotine dependence: Secondary | ICD-10-CM | POA: Diagnosis not present

## 2014-09-27 DIAGNOSIS — F0390 Unspecified dementia without behavioral disturbance: Secondary | ICD-10-CM | POA: Insufficient documentation

## 2014-09-27 DIAGNOSIS — I2510 Atherosclerotic heart disease of native coronary artery without angina pectoris: Secondary | ICD-10-CM | POA: Diagnosis not present

## 2014-09-27 DIAGNOSIS — S022XXA Fracture of nasal bones, initial encounter for closed fracture: Secondary | ICD-10-CM | POA: Diagnosis not present

## 2014-09-27 DIAGNOSIS — S023XXA Fracture of orbital floor, initial encounter for closed fracture: Secondary | ICD-10-CM | POA: Diagnosis not present

## 2014-09-27 DIAGNOSIS — S0181XA Laceration without foreign body of other part of head, initial encounter: Secondary | ICD-10-CM

## 2014-09-27 DIAGNOSIS — Z8719 Personal history of other diseases of the digestive system: Secondary | ICD-10-CM | POA: Diagnosis not present

## 2014-09-27 DIAGNOSIS — S0990XA Unspecified injury of head, initial encounter: Secondary | ICD-10-CM | POA: Diagnosis present

## 2014-09-27 DIAGNOSIS — S028XXA Fractures of other specified skull and facial bones, initial encounter for closed fracture: Secondary | ICD-10-CM | POA: Diagnosis not present

## 2014-09-27 DIAGNOSIS — S02401A Maxillary fracture, unspecified, initial encounter for closed fracture: Secondary | ICD-10-CM

## 2014-09-27 DIAGNOSIS — W1839XA Other fall on same level, initial encounter: Secondary | ICD-10-CM | POA: Diagnosis not present

## 2014-09-27 DIAGNOSIS — Z8619 Personal history of other infectious and parasitic diseases: Secondary | ICD-10-CM | POA: Insufficient documentation

## 2014-09-27 DIAGNOSIS — Z87440 Personal history of urinary (tract) infections: Secondary | ICD-10-CM | POA: Insufficient documentation

## 2014-09-27 DIAGNOSIS — S0285XA Fracture of orbit, unspecified, initial encounter for closed fracture: Secondary | ICD-10-CM

## 2014-09-27 DIAGNOSIS — Z79899 Other long term (current) drug therapy: Secondary | ICD-10-CM | POA: Diagnosis not present

## 2014-09-27 DIAGNOSIS — J439 Emphysema, unspecified: Secondary | ICD-10-CM | POA: Diagnosis not present

## 2014-09-27 DIAGNOSIS — I10 Essential (primary) hypertension: Secondary | ICD-10-CM | POA: Diagnosis not present

## 2014-09-27 DIAGNOSIS — Z87438 Personal history of other diseases of male genital organs: Secondary | ICD-10-CM | POA: Insufficient documentation

## 2014-09-27 DIAGNOSIS — S02402A Zygomatic fracture, unspecified, initial encounter for closed fracture: Secondary | ICD-10-CM | POA: Diagnosis not present

## 2014-09-27 DIAGNOSIS — W19XXXA Unspecified fall, initial encounter: Secondary | ICD-10-CM

## 2014-09-27 DIAGNOSIS — N390 Urinary tract infection, site not specified: Secondary | ICD-10-CM

## 2014-09-27 DIAGNOSIS — R40242 Glasgow coma scale score 9-12: Secondary | ICD-10-CM | POA: Diagnosis not present

## 2014-09-27 LAB — CBC WITH DIFFERENTIAL/PLATELET
Basophils Absolute: 0 10*3/uL (ref 0.0–0.1)
Basophils Relative: 0 %
Eosinophils Absolute: 0 10*3/uL (ref 0.0–0.7)
Eosinophils Relative: 0 %
HEMATOCRIT: 40.2 % (ref 39.0–52.0)
Hemoglobin: 12.8 g/dL — ABNORMAL LOW (ref 13.0–17.0)
Lymphocytes Relative: 6 %
Lymphs Abs: 0.9 10*3/uL (ref 0.7–4.0)
MCH: 31.3 pg (ref 26.0–34.0)
MCHC: 31.8 g/dL (ref 30.0–36.0)
MCV: 98.3 fL (ref 78.0–100.0)
MONOS PCT: 6 %
Monocytes Absolute: 0.8 10*3/uL (ref 0.1–1.0)
NEUTROS ABS: 11.8 10*3/uL — AB (ref 1.7–7.7)
Neutrophils Relative %: 88 %
Platelets: 412 10*3/uL — ABNORMAL HIGH (ref 150–400)
RBC: 4.09 MIL/uL — ABNORMAL LOW (ref 4.22–5.81)
RDW: 13.8 % (ref 11.5–15.5)
WBC: 13.5 10*3/uL — ABNORMAL HIGH (ref 4.0–10.5)

## 2014-09-27 LAB — URINALYSIS, ROUTINE W REFLEX MICROSCOPIC
BILIRUBIN URINE: NEGATIVE
GLUCOSE, UA: NEGATIVE mg/dL
Ketones, ur: 15 mg/dL — AB
LEUKOCYTES UA: NEGATIVE
Nitrite: POSITIVE — AB
Specific Gravity, Urine: 1.015 (ref 1.005–1.030)
Urobilinogen, UA: 0.2 mg/dL (ref 0.0–1.0)
pH: 7 (ref 5.0–8.0)

## 2014-09-27 LAB — COMPREHENSIVE METABOLIC PANEL
ALT: 11 U/L — ABNORMAL LOW (ref 17–63)
ANION GAP: 7 (ref 5–15)
AST: 19 U/L (ref 15–41)
Albumin: 3.9 g/dL (ref 3.5–5.0)
Alkaline Phosphatase: 92 U/L (ref 38–126)
BILIRUBIN TOTAL: 0.4 mg/dL (ref 0.3–1.2)
BUN: 23 mg/dL — ABNORMAL HIGH (ref 6–20)
CO2: 26 mmol/L (ref 22–32)
Calcium: 8.9 mg/dL (ref 8.9–10.3)
Chloride: 110 mmol/L (ref 101–111)
Creatinine, Ser: 1.1 mg/dL (ref 0.61–1.24)
GFR calc Af Amer: >60 mL/min (ref >60)
GFR calc non Af Amer: >60 mL/min (ref >60)
Glucose, Bld: 158 mg/dL — ABNORMAL HIGH (ref 65–99)
POTASSIUM: 3.8 mmol/L (ref 3.5–5.1)
Sodium: 143 mmol/L (ref 135–145)
Total Protein: 7.1 g/dL (ref 6.5–8.1)

## 2014-09-27 LAB — URINE MICROSCOPIC-ADD ON

## 2014-09-27 LAB — CBG MONITORING, ED: Glucose-Capillary: 163 mg/dL — ABNORMAL HIGH (ref 65–99)

## 2014-09-27 MED ORDER — SULFAMETHOXAZOLE-TRIMETHOPRIM 800-160 MG PO TABS: 1.0000 | ORAL_TABLET | Freq: Two times a day (BID) | ORAL | Status: AC

## 2014-09-27 MED ORDER — SULFAMETHOXAZOLE-TRIMETHOPRIM 800-160 MG PO TABS
1.0000 | ORAL_TABLET | Freq: Once | ORAL | Status: AC
  Administered 2014-09-27: 1 via ORAL
  Filled 2014-09-27: qty 1

## 2014-09-27 MED ORDER — LORAZEPAM 2 MG/ML IJ SOLN
1.0000 mg | Freq: Once | INTRAMUSCULAR | Status: AC
  Administered 2014-09-27: 1 mg via INTRAMUSCULAR
  Filled 2014-09-27: qty 1

## 2014-09-27 NOTE — ED Notes (Signed)
Changed patient's diaper. Patient tolerated well.

## 2014-09-27 NOTE — ED Notes (Signed)
Pt made aware to return if symptoms worsen or if any life threatening symptoms occur.   

## 2014-09-27 NOTE — ED Notes (Signed)
[facility] called for transport back to [facility].  Per Dispatcher, " it may be awhile because they have several awaiting transport  This am".

## 2014-09-27 NOTE — ED Notes (Signed)
Noted skin tears to right knee, right shin, right forearm, right thumb, left wrist, left elbow, and left knee.

## 2014-09-27 NOTE — ED Provider Notes (Signed)
CSN: [id number]     Arrival date & time 09/27/14  0424 History   First MD Initiated Contact with Patient 09/27/14 [phone]     Chief Complaint  Patient presents with  . Fall   Level V caveat for dementia  (Consider location/radiation/quality/duration/timing/severity/associated sxs/prior Treatment) HPI  Patient was brought to the emergency department by EMS. Patient has dementia and is in a nursing facility. Patient has had 3 unwitnessed falls tonight with injury to his head. He has Steri-Strips placed to an area on his left forehead near his eyebrow. He is noted to have some dried blood on his face around his nose. Patient is moving all extremities without apparent deformity or pain. Patient is nonverbal and does not answer questions. He is awake and alert however and aware of his surroundings.  Wife has arrived and states he's had balance problems and is unsteady on his feet for years. She states he walks with difficulty and is supposed to use a walker but he doesn't always remember to use it.   PCP Dr Baltazar Najjar  Patient is DO NOT RESUSCITATE  Past Medical History  Diagnosis Date  . ED (erectile dysfunction)   . Vitamin D deficiency   . Pancreatitis   . CAD (coronary artery disease)     cath 2001- one vessel occluded (circ); others patent.  EF 55%  . B12 deficiency 06/02/10    174  . Obesity   . Chronic bronchitis   . Emphysema of lung   . Blood transfusion   . Asthma   . Anemia, iron deficiency 06/02/10    heme negative, ferritin 5  . Diabetes mellitus   . GERD (gastroesophageal reflux disease)   . Hyperlipidemia   . Hypertension   . Nephrolithiasis   . Diverticulosis   . H. pylori infection 05/08/2010  . Hiatal hernia 05/08/2010    EGD  . Gastritis 05/08/2010    EGD  . Smoker   . Dementia 10/19/2012   Past Surgical History  Procedure Laterality Date  . Knee cartilage surgery    . Cardiac catheterization  01/19/99    showed blockage on"back side"   no stent  .  Colonoscopy  2005, 06/07/10    mild diverticulosis  . Upper gastrointestinal endoscopy  06/07/10    2 cm hiatal hernia, mild gastritis   Family History  Problem Relation Age of Onset  . Coronary artery disease Mother    Social History  Substance Use Topics  . Smoking status: Former Smoker    Types: Cigarettes    Quit date: 11/09/2012  . Smokeless tobacco: Never Used  . Alcohol Use: No  lives in NH  Review of Systems  Unable to perform ROS: Dementia      Allergies  Review of patient's allergies indicates no known allergies.  Home Medications   Prior to Admission medications   Medication Sig Start Date End Date Taking? Authorizing Provider  albuterol (VENTOLIN HFA) 108 (90 BASE) MCG/ACT inhaler Inhale 2 puffs into the lungs every 6 (six) hours as needed.      Historical Provider, MD  amLODipine (NORVASC) 10 MG tablet Take 1 tablet (10 mg total) by mouth daily. 06/07/13   Dorena Bodo, PA-C  aspirin EC 81 MG tablet Take 81 mg by mouth daily.    Historical Provider, MD  cyanocobalamin (,VITAMIN B-12,) 1000 MCG/ML injection Inject 1 mL (1,000 mcg total) into the muscle every 30 (thirty) days. 02/11/13   Dorena Bodo, PA-C  donepezil (ARICEPT)  10 MG tablet Take 10 mg by mouth at bedtime.    Historical Provider, MD  doxycycline (VIBRA-TABS) 100 MG tablet Take 1 tablet (100 mg total) by mouth 2 (two) times daily. 01/10/14   Patriciaann Clan Dixon, PA-C  iron polysaccharides (NU-IRON) 150 MG capsule Take 1 capsule (150 mg total) by mouth 2 (two) times daily. 04/19/13   Patriciaann Clan Dixon, PA-C  LORazepam (ATIVAN) 1 MG tablet Take 1 tablet (1 mg total) by mouth at bedtime as needed (aggitation). 10/12/13   Catarina Hartshorn, MD  metFORMIN (GLUCOPHAGE) 1000 MG tablet Take 1 tablet (1,000 mg total) by mouth 2 (two) times daily with a meal. 06/11/13   Dorena Bodo, PA-C  pioglitazone (ACTOS) 45 MG tablet Take 1 tablet (45 mg total) by mouth daily. 04/19/13   Patriciaann Clan Dixon, PA-C  sulfamethoxazole-trimethoprim (BACTRIM  DS,SEPTRA DS) 800-160 MG per tablet Take 1 tablet by mouth 2 (two) times daily. 09/27/14 10/04/14  Devoria Albe, MD   BP 148/85 mmHg  Pulse 100  Temp(Src) 98 F (36.7 C) (Oral)  Resp 20  Wt 150 lb (68.04 kg)  SpO2 94%  Vital signs normal   Physical Exam  Constitutional: He appears well-developed and well-nourished.  Non-toxic appearance. He does not appear ill. No distress.  HENT:  Head: Normocephalic.  Right Ear: External ear normal.  Left Ear: External ear normal.  Nose: Nose normal. No mucosal edema or rhinorrhea.  Mouth/Throat: Oropharynx is clear and moist and mucous membranes are normal. No dental abscesses or uvula swelling.  Patient has swelling around his right eyebrow with Steri-Strips in place. He had dried blood on his face with no other lacerations were seen. He also has an abrasion in his right forehead.  Eyes: Conjunctivae and EOM are normal. Pupils are equal, round, and reactive to light.  Neck: Normal range of motion and full passive range of motion without pain. Neck supple.  Cardiovascular: Normal rate, regular rhythm and normal heart sounds.  Exam reveals no gallop and no friction rub.   No murmur heard. Pulmonary/Chest: Effort normal and breath sounds normal. No respiratory distress. He has no wheezes. He has no rhonchi. He has no rales. He exhibits no tenderness and no crepitus.  Abdominal: Soft. Normal appearance and bowel sounds are normal. He exhibits no distension. There is no tenderness. There is no rebound and no guarding.  Musculoskeletal: Normal range of motion. He exhibits no edema or tenderness.  Moves all extremities well.   Neurological: He is alert. He has normal strength. No cranial nerve deficit.  Skin: Skin is warm, dry and intact. No rash noted. No erythema. No pallor.  Psychiatric: He is agitated. He is noncommunicative.  Nursing note and vitals reviewed.   ED Course  Procedures (including critical care time) Medications   sulfamethoxazole-trimethoprim (BACTRIM DS,SEPTRA DS) 800-160 MG per tablet 1 tablet (not administered)  LORazepam (ATIVAN) injection 1 mg (1 mg Intramuscular Given 09/27/14 0502)    Patient was given Ativan to help facilitate CT scans. Patient has not had blood work or urinalysis done in the past year. These were done tonight to make sure there was no other etiology for his falling other than his poor balance. Patient's urinalysis is suspicious for UTI. Urine culture was sent. His urine culture from 1 year ago was reviewed and he had over 100,000 colonies of Escherichia coli that was very sensitive to all antibiotics tested. Patient was given Septra DS by mouth. Septra was chosen because it is twice a day  and will be easier for the nursing home to give to patient.  Wife was given his test results and the need for antibiotics for probable UTI. She states his balance is usually bad however it does get worse when he has a UTI. She was advised to follow-up either with Dr. Leanord Hawking or Dr. Suszanne Conners who is on call for ENT for his fractures. Hopefully he will not fall any more and make his fractures become displaced.    44mo ago    Specimen Description URINE, RANDOM   Special Requests NONE   Culture Setup Time 10/08/2013 14:44 Performed at [facility] Count >=100,000 COLONIES/ML Performed at [facility]     Culture ESCHERICHIA COLI Performed at [facility]     Report Status 10/10/2013 FINAL   Organism ID, Bacteria ESCHERICHIA COLI   Resulting Agency [facility]    Culture & Susceptibility      ESCHERICHIA COLI     Antibiotic Sensitivity Microscan Status    AMPICILLIN Sensitive <=2 SENSITIVE Final    Method: MIC    CEFAZOLIN Sensitive <=4 SENSITIVE Final    Method: MIC    CEFTRIAXONE Sensitive <=1 SENSITIVE Final    Method: MIC    CIPROFLOXACIN Sensitive <=0.25 SENSITIVE Final    Method: MIC    GENTAMICIN Sensitive <=1  SENSITIVE Final    Method: MIC    LEVOFLOXACIN Sensitive <=0.12 SENSITIVE Final    Method: MIC    NITROFURANTOIN Sensitive <=16 SENSITIVE Final    Method: MIC    PIP/TAZO Sensitive <=4 SENSITIVE Final    Method: MIC    TOBRAMYCIN Sensitive <=1 SENSITIVE Final    Method: MIC    TRIMETH/SULFA Sensitive <=20 SENSITIVE Final    Method: MIC    Comments ESCHERICHIA COLI (MIC)    ESCHERICHIA COLI               Specimen Collected: 10/08/13 10:53 AM Last Resulted: 10/10/13 4:25 PM              Labs Review Results for orders placed or performed during the hospital encounter of 09/27/14  CBC with Differential/Platelet  Result Value Ref Range   WBC 13.5 (H) 4.0 - 10.5 K/uL   RBC 4.09 (L) 4.22 - 5.81 MIL/uL   Hemoglobin 12.8 (L) 13.0 - 17.0 g/dL   HCT 16.1 09.6 - 04.5 %   MCV 98.3 78.0 - 100.0 fL   MCH 31.3 26.0 - 34.0 pg   MCHC 31.8 30.0 - 36.0 g/dL   RDW 40.9 81.1 - 91.4 %   Platelets 412 (H) 150 - 400 K/uL   Neutrophils Relative % 88 %   Neutro Abs 11.8 (H) 1.7 - 7.7 K/uL   Lymphocytes Relative 6 %   Lymphs Abs 0.9 0.7 - 4.0 K/uL   Monocytes Relative 6 %   Monocytes Absolute 0.8 0.1 - 1.0 K/uL   Eosinophils Relative 0 %   Eosinophils Absolute 0.0 0.0 - 0.7 K/uL   Basophils Relative 0 %   Basophils Absolute 0.0 0.0 - 0.1 K/uL  Comprehensive metabolic panel  Result Value Ref Range   Sodium 143 135 - 145 mmol/L   Potassium 3.8 3.5 - 5.1 mmol/L   Chloride 110 101 - 111 mmol/L   CO2 26 22 - 32 mmol/L   Glucose, Bld 158 (H) 65 - 99 mg/dL   BUN 23 (H) 6 - 20 mg/dL   Creatinine, Ser 7.82 0.61 - 1.24 mg/dL  Calcium 8.9 8.9 - 10.3 mg/dL   Total Protein 7.1 6.5 - 8.1 g/dL   Albumin 3.9 3.5 - 5.0 g/dL   AST 19 15 - 41 U/L   ALT 11 (L) 17 - 63 U/L   Alkaline Phosphatase 92 38 - 126 U/L   Total Bilirubin 0.4 0.3 - 1.2 mg/dL   GFR calc non Af Amer >60 >60 mL/min   GFR calc Af Amer >60 >60 mL/min   Anion gap 7 5 - 15  Urinalysis, Routine w  reflex microscopic (not at [facility])  Result Value Ref Range   Color, Urine YELLOW YELLOW   APPearance HAZY (A) CLEAR   Specific Gravity, Urine 1.015 1.005 - 1.030   pH 7.0 5.0 - 8.0   Glucose, UA NEGATIVE NEGATIVE mg/dL   Hgb urine dipstick SMALL (A) NEGATIVE   Bilirubin Urine NEGATIVE NEGATIVE   Ketones, ur 15 (A) NEGATIVE mg/dL   Protein, ur TRACE (A) NEGATIVE mg/dL   Urobilinogen, UA 0.2 0.0 - 1.0 mg/dL   Nitrite POSITIVE (A) NEGATIVE   Leukocytes, UA NEGATIVE NEGATIVE  Urine microscopic-add on  Result Value Ref Range   RBC / HPF 3-6 <3 RBC/hpf   Bacteria, UA MANY (A) RARE  CBG monitoring, ED  Result Value Ref Range   Glucose-Capillary 163 (H) 65 - 99 mg/dL    Laboratory interpretation all normal except Hyperglycemia, possible UTI, stable mild anemia   Imaging Review Ct Head Wo Contrast  Ct Maxillofacial Wo Cm  09/27/2014   CLINICAL DATA:  Status post fall, laceration above left eye, bruising/swelling to nasal area, dementia  EXAM: CT HEAD WITHOUT CONTRAST  CT MAXILLOFACIAL WITHOUT CONTRAST  TECHNIQUE: Multidetector CT imaging of the head and maxillofacial structures were performed using the standard protocol without intravenous contrast. Multiplanar CT image reconstructions of the maxillofacial structures were also generated.  COMPARISON:  CT head dated 10/08/2013  FINDINGS: CT HEAD FINDINGS  No evidence of parenchymal hemorrhage or extra-axial fluid collection. No mass lesion, mass effect, or midline shift.  No CT evidence of acute infarction.  Subcortical white matter and periventricular small vessel ischemic changes. Mild intracranial atherosclerosis.  Global cortical atrophy.  No ventriculomegaly.  Layering fluid/hemorrhage in the bilateral maxillary sinuses, left greater than right. Bilateral mastoid air cells are clear.  Please see below for maxillofacial findings. Otherwise, no evidence of calvarial fracture.  CT MAXILLOFACIAL FINDINGS  Bilateral nasal bone fractures with  associated nasal septal fracture (series 5/ image 31). No definite nasal septal hematoma.  Fractures involving the anterior, posterior, and medial walls of the left maxilla (series 5/ image 40). Associated layering fluid/hemorrhage. Associated nondisplaced fractures of the lateral wall of the left orbit (series 5/ image 32) and the left zygomatic arch (series 5/ image 41). Soft tissue swelling overlying the lateral left orbit (series 4/image 23).  Fractures involving the anterior, posterior, and medial walls of the right maxilla (series 5/image 50). Associated layering fluid/ hemorrhage.  Bilateral orbits, including the globes and retroconal soft tissues, are otherwise within normal limits.  Mandible is intact. The bilateral mandibular condyles are well-seated in the TMJs.  Partial opacification of the left ethmoid air cells. Partial opacification of the right mastoid air cells (series 5/ image 47).  Cervical spine is intact to C4-5.  IMPRESSION: No evidence of acute intracranial abnormality. Atrophy with small vessel ischemic changes.  Bilateral nasal bone fractures with associated nasal septal fracture.  Bilateral anterior, posterior, and medial maxillary wall fractures, as above.  Associated nondisplaced fracture involving the  lateral wall of the left orbit and the left zygomatic arch. Soft tissue swelling overlying the lateral left orbit. Underlying globe and retroconal soft tissues are within normal limits.  Cervical spine is intact to C4-5.   Electronically Signed   By: Charline Bills M.D.   On: 09/27/2014 06:19   I have personally reviewed and evaluated these images and lab results as part of my medical decision-making.   EKG Interpretation None      MDM   Final diagnoses:  Fall at nursing home, initial encounter  Maxillary sinus fracture, closed, initial encounter  Nasal fracture, closed, initial encounter  Zygoma fracture, closed, initial encounter  Orbit fracture, left, closed, initial  encounter  Facial laceration, initial encounter  Urinary tract infection without hematuria, site unspecified   New Prescriptions   SULFAMETHOXAZOLE-TRIMETHOPRIM (BACTRIM DS,SEPTRA DS) 800-160 MG PER TABLET    Take 1 tablet by mouth 2 (two) times daily.    Plan discharge  Devoria Albe, MD, Concha Pyo, MD 09/27/14 [phone]

## 2014-09-27 NOTE — Discharge Instructions (Signed)
Ice packs to the swollen areas. Give him the antibiotics until gone. His urine culture should be resulted in about 2 days. He has several facial fractures. He can be evaluated by Dr Suszanne Conners, the ENT on call for these. Try to prevent him from falling again. Recheck for any problems listed on the head injury sheet.  Head Injury You have a head injury. Headaches and throwing up (vomiting) are common after a head injury. It should be easy to wake up from sleeping. Sometimes you must stay in the hospital. Most problems happen within the first 24 hours. Side effects may occur up to 7-10 days after the injury.  WHAT ARE THE TYPES OF HEAD INJURIES? Head injuries can be as minor as a bump. Some head injuries can be more severe. More severe head injuries include:  A jarring injury to the brain (concussion).  A bruise of the brain (contusion). This mean there is bleeding in the brain that can cause swelling.  A cracked skull (skull fracture).  Bleeding in the brain that collects, clots, and forms a bump (hematoma). WHEN SHOULD I GET HELP RIGHT AWAY?   You are confused or sleepy.  You cannot be woken up.  You feel sick to your stomach (nauseous) or keep throwing up (vomiting).  Your dizziness or unsteadiness is getting worse.  You have very bad, lasting headaches that are not helped by medicine. Take medicines only as told by your doctor.  You cannot use your arms or legs like normal.  You cannot walk.  You notice changes in the black spots in the center of the colored part of your eye (pupil).  You have clear or bloody fluid coming from your nose or ears.  You have trouble seeing. During the next 24 hours after the injury, you must stay with someone who can watch you. This person should get help right away (call 911 in the [place] if you start to shake and are not able to control it (have seizures), you pass out, or you are unable to wake up. HOW CAN I PREVENT A HEAD INJURY IN THE FUTURE?  Wear  seat belts.  Wear a helmet while bike riding and playing sports like football.  Stay away from dangerous activities around the house. WHEN CAN I RETURN TO NORMAL ACTIVITIES AND ATHLETICS? See your doctor before doing these activities. You should not do normal activities or play contact sports until 1 week after the following symptoms have stopped:  Headache that does not go away.  Dizziness.  Poor attention.  Confusion.  Memory problems.  Sickness to your stomach or throwing up.  Tiredness.  Fussiness.  Bothered by bright lights or loud noises.  Anxiousness or depression.  Restless sleep. MAKE SURE YOU:   Understand these instructions.  Will watch your condition.  Will get help right away if you are not doing well or get worse. Document Released: 12/07/2007 Document Revised: 05/10/2013 Document Reviewed: 08/31/2012 [facility] Patient Information 2015 [place], [place]. This information is not intended to replace advice given to you by your health care provider. Make sure you discuss any questions you have with your health care provider.

## 2014-09-27 NOTE — ED Notes (Addendum)
Cleaned dried blood from patient's face and left hand. Patient has steri strips applied to laceration above left eye. Bruising and swelling noted to nasal area. Kayla from [facility] stated she applied the steri strips tonight after patient had fall earlier in the night. States patient has fallen x 3 tonight. States falls were unwitnessed and patient was attempting to get out of floor upon arrival to room. Patient placed in trendelenburg position in bed with bed alarm placed under patient's bottom. Patient continues to try to get out of bed at this time. Wife at bedside. Patient asked where he is hurting and he places his hand to the left side of his face. Patient moving bilateral lower extremities with no difficulty noted.

## 2014-09-27 NOTE — ED Notes (Signed)
Pt has had 3 fall in last 24 hours at nursing facility, [facility]. Pt has lac to the left eyebrow and dried blood around nose.

## 2014-09-27 NOTE — ED Notes (Signed)
Per Diane at [facility], staff will be able to help transport pt from private vehicle to room upon arrival.

## 2014-09-28 ENCOUNTER — Non-Acute Institutional Stay (SKILLED_NURSING_FACILITY): Payer: Medicare Other | Admitting: Internal Medicine

## 2014-09-28 DIAGNOSIS — F0151 Vascular dementia with behavioral disturbance: Secondary | ICD-10-CM | POA: Diagnosis not present

## 2014-09-28 DIAGNOSIS — F01518 Vascular dementia, unspecified severity, with other behavioral disturbance: Secondary | ICD-10-CM

## 2014-09-28 DIAGNOSIS — S0292XA Unspecified fracture of facial bones, initial encounter for closed fracture: Secondary | ICD-10-CM | POA: Diagnosis not present

## 2014-09-28 DIAGNOSIS — S060X0A Concussion without loss of consciousness, initial encounter: Secondary | ICD-10-CM | POA: Diagnosis not present

## 2014-09-29 DIAGNOSIS — R278 Other lack of coordination: Secondary | ICD-10-CM | POA: Diagnosis not present

## 2014-09-29 DIAGNOSIS — R296 Repeated falls: Secondary | ICD-10-CM | POA: Diagnosis not present

## 2014-09-30 DIAGNOSIS — R278 Other lack of coordination: Secondary | ICD-10-CM | POA: Diagnosis not present

## 2014-09-30 DIAGNOSIS — R296 Repeated falls: Secondary | ICD-10-CM | POA: Diagnosis not present

## 2014-09-30 LAB — URINE CULTURE

## 2014-10-01 DIAGNOSIS — R296 Repeated falls: Secondary | ICD-10-CM | POA: Diagnosis not present

## 2014-10-01 DIAGNOSIS — R278 Other lack of coordination: Secondary | ICD-10-CM | POA: Diagnosis not present

## 2014-10-02 ENCOUNTER — Telehealth (HOSPITAL_COMMUNITY): Payer: Self-pay

## 2014-10-02 NOTE — Telephone Encounter (Signed)
Post ED Visit - Positive Culture Follow-up  Culture report reviewed by antimicrobial stewardship pharmacist:   Celedonio Miyamoto, Pharm.D., BCPS  Georgina Pillion, Pharm.D., BCPS  [place], [place].D., BCPS, AAHIVP  Estella Husk, Pharm.D., BCPS, AAHIVP  [employer], [address].D.  Tennis Must, Pharm.DJules Husbands,  Pharm D  Positive Urine culture, >/= 100,000 colonies -> Staph. Species Treated with Sulfa.-Trimeth., organism sensitive to the same and no further patient follow-up is required at this time.   Arvid Right 10/02/2014, 4:52 AM

## 2014-10-03 DIAGNOSIS — R278 Other lack of coordination: Secondary | ICD-10-CM | POA: Diagnosis not present

## 2014-10-03 DIAGNOSIS — R296 Repeated falls: Secondary | ICD-10-CM | POA: Diagnosis not present

## 2014-10-04 DIAGNOSIS — R296 Repeated falls: Secondary | ICD-10-CM | POA: Diagnosis not present

## 2014-10-04 DIAGNOSIS — R278 Other lack of coordination: Secondary | ICD-10-CM | POA: Diagnosis not present

## 2014-10-04 NOTE — Progress Notes (Addendum)
Patient ID: Jonathan [name], male   DOB: 10-24-1940, 74 y.o.   MRN: [id number]                PROACHILLES NEVILLEDATE:  09/28/2014         FACILITY: [facility]                 LEVEL OF CARE:   SNF   Acute Visit                       CHIEF COMPLAINT:  Review, status post trip to [facility] ER after several falls on 09/26/2014.          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  I have thought previously that this is severe enough to result in recurrent aspiration pneumonia.    He also has a diagnosis of COPD.     Two nights ago, I was called to a report of multiple falls, including one that obviously landed him flat on his face.   He was sent to [facility] ER.  There was no evidence of an intracranial problem other than periventricular small vessel disease.    CT scan of the maxillofacial area revealed several fractures.  This included fractures involving the anterior, posterior, and medial walls of the left maxilla.  There were associated nondisplaced fractures of the lateral wall of the left orbit and the left zygomatic arch.  Soft tissue swelling was noted over the lateral left orbit.   There were fractures involving the anterior, posterior, and medial walls of the right maxilla.  Bilateral orbits were otherwise within normal limits.  The mandible was intact.  There were bilateral nasal bone fractures with associated nasal septal fracture.    He was recommended to have an ENT consult, which I believe was arranged.    In the emergency room, he was felt to have a UTI.  This was based on urinalysis that showed a positive nitrate.  However, his leukocytes were negative.   Urine culture is still pending.     CURRENT MEDICATIONS:  Medication list is reviewed.           Norvasc 10 q.d.      Enteric-coated aspirin 81 q.d.       Ferrex 150 b.i.d.      Aricept 10 q.d.      Vitamin B12, 1000 U monthly.    Glucophage 1000 b.i.d.       DuoNebs q.6 hours p.r.n.       LABORATORY DATA:   In terms of lab work:    Sodium 143, potassium 3.8, BUN 23, creatinine 1.1.     Liver function tests normal.     Albumin normal at 3.9.    White count 13.5, hemoglobin 12.8, platelet count 412.   He had 88% neutrophils.     REVIEW OF SYSTEMS:   Absolutely not possible from the patient secondary to dementia.   GENERAL:  Staff report that he now needs to be fed, whereas previously he could do that himself.  He is not as stable on his feet.     PHYSICAL EXAMINATION:   VITAL SIGNS:     TEMPERATURE:  97.     PULSE:  80.      RESPIRATIONS:  20.     BLOOD PRESSURE:  120/74.     02 SATURATIONS:  96% on room air.     GENERAL  APPEARANCE:  The patient is awake.    HEENT:   HEAD:  He has obvious facial trauma.   NOSE:  His entire nose seems to be deviated to the right with some swelling.     EYES:  His right eye is swollen shut.  He has a superficial abrasion above his left eyebrow.  It was difficult to get him to open his right eye, although the glimpse I got did not look worrisome.  His pupil looks stable.   MOUTH/THROAT:  Again, difficult.  No oral lesions seen.    CHEST/RESPIRATORY:  Clear air entry bilaterally.    CARDIOVASCULAR:   CARDIAC:  Heart sounds are normal.  There are no murmurs.    GASTROINTESTINAL:   LIVER/SPLEEN/KIDNEYS:  No liver, no spleen.  No tenderness.     GENITOURINARY:   BLADDER:  No suprapubic or costovertebral angle tenderness.   MUSCULOSKELETAL:   EXTREMITIES:   BILATERAL UPPER EXTREMITIES:  I wonder whether there is something in his right shoulder.  He would not let me examine this.  Left arm seems normal.   I will need to follow up on this next week.   BILATERAL LOWER EXTREMITIES:  Both legs are stable.     ASSESSMENT/PLAN:              Facial trauma with multiple fractures, as noted.  There was no intracranial abnormality.  His declining cognitive function is probably related to a concussion on top of  his multi-infarct dementia.    Concussion.  See discussion above.    Type 2 diabetes.  On metformin.  Recent blood sugars all appear to be in the 120-130 range.    I will need to follow this patient's right shoulder.   I will review things next week when I am in the building.     No follow-up lab work is indicated.    He is to follow up with ENT.

## 2014-10-05 DIAGNOSIS — R278 Other lack of coordination: Secondary | ICD-10-CM | POA: Diagnosis not present

## 2014-10-05 DIAGNOSIS — R296 Repeated falls: Secondary | ICD-10-CM | POA: Diagnosis not present

## 2014-10-06 DIAGNOSIS — R296 Repeated falls: Secondary | ICD-10-CM | POA: Diagnosis not present

## 2014-10-06 DIAGNOSIS — R278 Other lack of coordination: Secondary | ICD-10-CM | POA: Diagnosis not present

## 2014-10-07 DIAGNOSIS — R296 Repeated falls: Secondary | ICD-10-CM | POA: Diagnosis not present

## 2014-10-07 DIAGNOSIS — R278 Other lack of coordination: Secondary | ICD-10-CM | POA: Diagnosis not present

## 2014-10-08 DIAGNOSIS — R296 Repeated falls: Secondary | ICD-10-CM | POA: Diagnosis not present

## 2014-10-08 DIAGNOSIS — R278 Other lack of coordination: Secondary | ICD-10-CM | POA: Diagnosis not present

## 2014-10-10 DIAGNOSIS — R296 Repeated falls: Secondary | ICD-10-CM | POA: Diagnosis not present

## 2014-10-10 DIAGNOSIS — R278 Other lack of coordination: Secondary | ICD-10-CM | POA: Diagnosis not present

## 2014-10-11 DIAGNOSIS — R278 Other lack of coordination: Secondary | ICD-10-CM | POA: Diagnosis not present

## 2014-10-11 DIAGNOSIS — R296 Repeated falls: Secondary | ICD-10-CM | POA: Diagnosis not present

## 2014-10-12 DIAGNOSIS — R278 Other lack of coordination: Secondary | ICD-10-CM | POA: Diagnosis not present

## 2014-10-12 DIAGNOSIS — R296 Repeated falls: Secondary | ICD-10-CM | POA: Diagnosis not present

## 2014-10-13 ENCOUNTER — Encounter (HOSPITAL_COMMUNITY): Payer: Self-pay

## 2014-10-13 ENCOUNTER — Emergency Department (HOSPITAL_COMMUNITY): Payer: Medicare Other

## 2014-10-13 ENCOUNTER — Emergency Department (HOSPITAL_COMMUNITY)
Admission: EM | Admit: 2014-10-13 | Discharge: 2014-10-13 | Disposition: A | Discharge status: Home / Self Care | Payer: Medicare Other | Attending: Emergency Medicine | Admitting: Emergency Medicine

## 2014-10-13 DIAGNOSIS — I2510 Atherosclerotic heart disease of native coronary artery without angina pectoris: Secondary | ICD-10-CM | POA: Insufficient documentation

## 2014-10-13 DIAGNOSIS — W19XXXA Unspecified fall, initial encounter: Secondary | ICD-10-CM

## 2014-10-13 DIAGNOSIS — S51002A Unspecified open wound of left elbow, initial encounter: Secondary | ICD-10-CM | POA: Diagnosis not present

## 2014-10-13 DIAGNOSIS — S80211A Abrasion, right knee, initial encounter: Secondary | ICD-10-CM | POA: Diagnosis not present

## 2014-10-13 DIAGNOSIS — Z792 Long term (current) use of antibiotics: Secondary | ICD-10-CM | POA: Diagnosis not present

## 2014-10-13 DIAGNOSIS — I10 Essential (primary) hypertension: Secondary | ICD-10-CM | POA: Diagnosis not present

## 2014-10-13 DIAGNOSIS — S0181XA Laceration without foreign body of other part of head, initial encounter: Secondary | ICD-10-CM | POA: Diagnosis not present

## 2014-10-13 DIAGNOSIS — Y92129 Unspecified place in nursing home as the place of occurrence of the external cause: Secondary | ICD-10-CM | POA: Diagnosis not present

## 2014-10-13 DIAGNOSIS — Z87442 Personal history of urinary calculi: Secondary | ICD-10-CM | POA: Diagnosis not present

## 2014-10-13 DIAGNOSIS — R296 Repeated falls: Secondary | ICD-10-CM | POA: Diagnosis not present

## 2014-10-13 DIAGNOSIS — F0390 Unspecified dementia without behavioral disturbance: Secondary | ICD-10-CM | POA: Insufficient documentation

## 2014-10-13 DIAGNOSIS — Z7982 Long term (current) use of aspirin: Secondary | ICD-10-CM | POA: Insufficient documentation

## 2014-10-13 DIAGNOSIS — R278 Other lack of coordination: Secondary | ICD-10-CM | POA: Diagnosis not present

## 2014-10-13 DIAGNOSIS — Z8719 Personal history of other diseases of the digestive system: Secondary | ICD-10-CM | POA: Insufficient documentation

## 2014-10-13 DIAGNOSIS — W1830XA Fall on same level, unspecified, initial encounter: Secondary | ICD-10-CM | POA: Insufficient documentation

## 2014-10-13 DIAGNOSIS — Z87438 Personal history of other diseases of male genital organs: Secondary | ICD-10-CM | POA: Diagnosis not present

## 2014-10-13 DIAGNOSIS — Y939 Activity, unspecified: Secondary | ICD-10-CM | POA: Diagnosis not present

## 2014-10-13 DIAGNOSIS — Y998 Other external cause status: Secondary | ICD-10-CM | POA: Diagnosis not present

## 2014-10-13 DIAGNOSIS — S01112A Laceration without foreign body of left eyelid and periocular area, initial encounter: Secondary | ICD-10-CM | POA: Diagnosis not present

## 2014-10-13 DIAGNOSIS — Z79899 Other long term (current) drug therapy: Secondary | ICD-10-CM | POA: Diagnosis not present

## 2014-10-13 DIAGNOSIS — R402421 Glasgow coma scale score 9-12, in the field [EMT or ambulance]: Secondary | ICD-10-CM | POA: Diagnosis not present

## 2014-10-13 DIAGNOSIS — S51012A Laceration without foreign body of left elbow, initial encounter: Secondary | ICD-10-CM

## 2014-10-13 DIAGNOSIS — J45909 Unspecified asthma, uncomplicated: Secondary | ICD-10-CM | POA: Insufficient documentation

## 2014-10-13 DIAGNOSIS — D509 Iron deficiency anemia, unspecified: Secondary | ICD-10-CM | POA: Diagnosis not present

## 2014-10-13 DIAGNOSIS — S0590XA Unspecified injury of unspecified eye and orbit, initial encounter: Secondary | ICD-10-CM | POA: Diagnosis not present

## 2014-10-13 DIAGNOSIS — E538 Deficiency of other specified B group vitamins: Secondary | ICD-10-CM | POA: Diagnosis not present

## 2014-10-13 DIAGNOSIS — E669 Obesity, unspecified: Secondary | ICD-10-CM | POA: Insufficient documentation

## 2014-10-13 DIAGNOSIS — Z8619 Personal history of other infectious and parasitic diseases: Secondary | ICD-10-CM | POA: Insufficient documentation

## 2014-10-13 DIAGNOSIS — Z87891 Personal history of nicotine dependence: Secondary | ICD-10-CM | POA: Diagnosis not present

## 2014-10-13 DIAGNOSIS — S0592XA Unspecified injury of left eye and orbit, initial encounter: Secondary | ICD-10-CM | POA: Diagnosis present

## 2014-10-13 NOTE — Discharge Instructions (Signed)
Fall Prevention in the Home  °Falls can cause injuries and can affect people from all age groups. There are many simple things that you can do to make your home safe and to help prevent falls. °WHAT CAN I DO ON THE OUTSIDE OF MY HOME? °· Regularly repair the edges of walkways and driveways and fix any cracks. °· Remove high doorway thresholds. °· Trim any shrubbery on the main path into your home. °· Use bright outdoor lighting. °· Clear walkways of debris and clutter, including tools and rocks. °· Regularly check that handrails are securely fastened and in good repair. Both sides of any steps should have handrails. °· Install guardrails along the edges of any raised decks or porches. °· Have leaves, snow, and ice cleared regularly. °· Use sand or salt on walkways during winter months. °· In the garage, clean up any spills right away, including grease or oil spills. °WHAT CAN I DO IN THE BATHROOM? °· Use night lights. °· Install grab bars by the toilet and in the tub and shower. Do not use towel bars as grab bars. °· Use non-skid mats or decals on the floor of the tub or shower. °· If you need to sit down while you are in the shower, use a plastic, non-slip stool.. °· Keep the floor dry. Immediately clean up any water that spills on the floor. °· Remove soap buildup in the tub or shower on a regular basis. °· Attach bath mats securely with double-sided non-slip rug tape. °· Remove throw rugs and other tripping hazards from the floor. °WHAT CAN I DO IN THE BEDROOM? °· Use night lights. °· Make sure that a bedside light is easy to reach. °· Do not use oversized bedding that drapes onto the floor. °· Have a firm chair that has side arms to use for getting dressed. °· Remove throw rugs and other tripping hazards from the floor. °WHAT CAN I DO IN THE KITCHEN?  °· Clean up any spills right away. °· Avoid walking on wet floors. °· Place frequently used items in easy-to-reach places. °· If you need to reach for something  above you, use a sturdy step stool that has a grab bar. °· Keep electrical cables out of the way. °· Do not use floor polish or wax that makes floors slippery. If you have to use wax, make sure that it is non-skid floor wax. °· Remove throw rugs and other tripping hazards from the floor. °WHAT CAN I DO IN THE STAIRWAYS? °· Do not leave any items on the stairs. °· Make sure that there are handrails on both sides of the stairs. Fix handrails that are broken or loose. Make sure that handrails are as long as the stairways. °· Check any carpeting to make sure that it is firmly attached to the stairs. Fix any carpet that is loose or worn. °· Avoid having throw rugs at the top or bottom of stairways, or secure the rugs with carpet tape to prevent them from moving. °· Make sure that you have a light switch at the top of the stairs and the bottom of the stairs. If you do not have them, have them installed. °WHAT ARE SOME OTHER FALL PREVENTION TIPS? °· Wear closed-toe shoes that fit well and support your feet. Wear shoes that have rubber soles or low heels. °· When you use a stepladder, make sure that it is completely opened and that the sides are firmly locked. Have someone hold the ladder while you   are using it. Do not climb a closed stepladder.  Add color or contrast paint or tape to grab bars and handrails in your home. Place contrasting color strips on the first and last steps.  Use mobility aids as needed, such as canes, walkers, scooters, and crutches.  Turn on lights if it is dark. Replace any light bulbs that burn out.  Set up furniture so that there are clear paths. Keep the furniture in the same spot.  Fix any uneven floor surfaces.  Choose a carpet design that does not hide the edge of steps of a stairway.  Be aware of any and all pets.  Review your medicines with your healthcare provider. Some medicines can cause dizziness or changes in blood pressure, which increase your risk of falling. Talk  with your health care provider about other ways that you can decrease your risk of falls. This may include working with a physical therapist or trainer to improve your strength, balance, and endurance.   This information is not intended to replace advice given to you by your health care provider. Make sure you discuss any questions you have with your health care provider.   Document Released: 12/14/2001 Document Revised: 05/10/2014 Document Reviewed: 01/28/2014 Elsevier Interactive Patient Education 2016 [place].  Tissue Adhesive Wound Care Some cuts, wounds, lacerations, and incisions can be repaired by using tissue adhesive. Tissue adhesive is like glue. It holds the skin together, allowing for faster healing. It forms a strong bond on the skin in about 1 minute and reaches its full strength in about 2 or 3 minutes. The adhesive disappears naturally while the wound is healing. It is important to take proper care of your wound at home while it heals.  HOME CARE INSTRUCTIONS   Showers are allowed. Do not soak the area containing the tissue adhesive. Do not take baths, swim, or use hot tubs. Do not use any soaps or ointments on the wound. Certain ointments can weaken the glue.  If a bandage (dressing) has been applied, follow your health care provider's instructions for how often to change the dressing.   Keep the dressing dry if one has been applied.   Do not scratch, pick, or rub the adhesive.   Do not place tape over the adhesive. The adhesive could come off when pulling the tape off.   Protect the wound from further injury until it is healed.   Protect the wound from sun and tanning bed exposure while it is healing and for several weeks after healing.   Only take over-the-counter or prescription medicines as directed by your health care provider.   Keep all follow-up appointments as directed by your health care provider. SEEK IMMEDIATE MEDICAL CARE IF:   Your wound  becomes red, swollen, hot, or tender.   You develop a rash after the glue is applied.  You have increasing pain in the wound.   You have a red streak that goes away from the wound.   You have pus coming from the wound.   You have increased bleeding.  You have a fever.  You have shaking chills.   You notice a bad smell coming from the wound.   Your wound or adhesive breaks open.  MAKE SURE YOU:   Understand these instructions.  Will watch your condition.  Will get help right away if you are not doing well or get worse.   This information is not intended to replace advice given to you by your health care  provider. Make sure you discuss any questions you have with your health care provider.   Document Released: 06/19/2000 Document Revised: 10/14/2012 Document Reviewed: 07/15/2012 Elsevier Interactive Patient Education 2016 [employer] Inc[employer]  Abrasion An abrasion is a cut or scrape on the outer surface of your skin. An abrasion does not extend through all of the layers of your skin. It is important to care for your abrasion properly to prevent infection. CAUSES Most abrasions are caused by falling on or gliding across the ground or another surface. When your skin rubs on something, the outer and inner layer of skin rubs off.  SYMPTOMS A cut or scrape is the main symptom of this condition. The scrape may be bleeding, or it may appear red or pink. If there was an associated fall, there may be an underlying bruise. DIAGNOSIS An abrasion is diagnosed with a physical exam. TREATMENT Treatment for this condition depends on how large and deep the abrasion is. Usually, your abrasion will be cleaned with water and mild soap. This removes any dirt or debris that may be stuck. An antibiotic ointment may be applied to the abrasion to help prevent infection. A bandage (dressing) may be placed on the abrasion to keep it clean. You may also need a tetanus shot. HOME CARE  INSTRUCTIONS Medicines  Take or apply medicines only as directed by your health care provider.  If you were prescribed an antibiotic ointment, finish all of it even if you start to feel better. Wound Care  Clean the wound with mild soap and water 2-3 times per day or as directed by your health care provider. Pat your wound dry with a clean towel. Do not rub it.  There are many different ways to close and cover a wound. Follow instructions from your health care provider about:  Wound care.  Dressing changes and removal.  Check your wound every day for signs of infection. Watch for:  Redness, swelling, or pain.  Fluid, blood, or pus. General Instructions  Keep the dressing dry as directed by your health care provider. Do not take baths, swim, use a hot tub, or do anything that would put your wound underwater until your health care provider approves.  If there is swelling, raise (elevate) the injured area above the level of your heart while you are sitting or lying down.  Keep all follow-up visits as directed by your health care provider. This is important. SEEK MEDICAL CARE IF:  You received a tetanus shot and you have swelling, severe pain, redness, or bleeding at the injection site.  Your pain is not controlled with medicine.  You have increased redness, swelling, or pain at the site of your wound. SEEK IMMEDIATE MEDICAL CARE IF:  You have a red streak going away from your wound.  You have a fever.  You have fluid, blood, or pus coming from your wound.  You notice a bad smell coming from your wound or your dressing.   This information is not intended to replace advice given to you by your health care provider. Make sure you discuss any questions you have with your health care provider.   Document Released: 10/03/2004 Document Revised: 09/14/2014 Document Reviewed: 12/22/2013 Elsevier Interactive Patient Education 2016 Elsevier Inc.  Skin Tear Care A skin tear is a  wound in which the top layer of skin has peeled off. This is a common problem with aging because the skin becomes thinner and more fragile as a person gets older. In addition, some medicines,  such as oral corticosteroids, can lead to skin thinning if taken for long periods of time.  A skin tear is often repaired with tape or skin adhesive strips. This keeps the skin that has been peeled off in contact with the healthier skin beneath. Depending on the location of the wound, a bandage (dressing) may be applied over the tape or skin adhesive strips. Sometimes, during the healing process, the skin turns black and dies. Even when this happens, the torn skin acts as a good dressing until the skin underneath gets healthier and repairs itself. HOME CARE INSTRUCTIONS   Change dressings once per day or as directed by your caregiver.  Gently clean the skin tear and the area around the tear using saline solution or mild soap and water.  Do not rub the injured skin dry. Let the area air dry.  Apply petroleum jelly or an antibiotic cream or ointment to keep the tear moist. This will help the wound heal. Do not allow a scab to form.  If the dressing sticks before the next dressing change, moisten it with warm soapy water and gently remove it.  Protect the injured skin until it has healed.  Only take over-the-counter or prescription medicines as directed by your caregiver.  Take showers or baths using warm soapy water. Apply a new dressing after the shower or bath.  Keep all follow-up appointments as directed by your caregiver.  SEEK IMMEDIATE MEDICAL CARE IF:   You have redness, swelling, or increasing pain in the skin tear.  You havepus coming from the skin tear.  You have chills.  You have a red streak that goes away from the skin tear.  You have a bad smell coming from the tear or dressing.  You have a fever or persistent symptoms for more than 2-3 days.  You have a fever and your symptoms  suddenly get worse. MAKE SURE YOU:  Understand these instructions.  Will watch this condition.  Will get help right away if your child is not doing well or gets worse.   This information is not intended to replace advice given to you by your health care provider. Make sure you discuss any questions you have with your health care provider.   Document Released: 09/18/2000 Document Revised: 09/18/2011 Document Reviewed: 07/08/2011 Elsevier Interactive Patient Education [employer]

## 2014-10-13 NOTE — ED Notes (Signed)
Pt is resident of [facility] where he apparently fell tonight.  Pt has laceration to left eye/eyebrow with bleeding controlled.  Pt has a couple of abrasions/skin tears to forearms.

## 2014-10-13 NOTE — ED Notes (Signed)
Patient given discharge instruction, verbalized understand. Patient ambulatory out of the department.  

## 2014-10-13 NOTE — ED Provider Notes (Signed)
CSN: [id number]     Arrival date & time 10/13/14  0409 History   First MD Initiated Contact with Patient 10/13/14 [phone]     Chief Complaint  Patient presents with  . Fall     (Consider location/radiation/quality/duration/timing/severity/associated sxs/prior Treatment) Patient is a 74 y.o. male presenting with fall. The history is provided by the nursing home. The history is limited by the condition of the patient (Dementia).  Fall  He had an unwitnessed fall at the nursing home where he resides. He suffered a laceration above his left eye and skin tears to his left forearm. Patient has no memory of the incident.  Past Medical History  Diagnosis Date  . ED (erectile dysfunction)   . Vitamin D deficiency   . Pancreatitis   . CAD (coronary artery disease)     cath 2001- one vessel occluded (circ); others patent.  EF 55%  . B12 deficiency 06/02/10    174  . Obesity   . Chronic bronchitis   . Emphysema of lung (HCC)   . Blood transfusion   . Asthma   . Anemia, iron deficiency 06/02/10    heme negative, ferritin 5  . Diabetes mellitus   . GERD (gastroesophageal reflux disease)   . Hyperlipidemia   . Hypertension   . Nephrolithiasis   . Diverticulosis   . H. pylori infection 05/08/2010  . Hiatal hernia 05/08/2010    EGD  . Gastritis 05/08/2010    EGD  . Smoker   . Dementia 10/19/2012   Past Surgical History  Procedure Laterality Date  . Knee cartilage surgery    . Cardiac catheterization  01/19/99    showed blockage on"back side"   no stent  . Colonoscopy  2005, 06/07/10    mild diverticulosis  . Upper gastrointestinal endoscopy  06/07/10    2 cm hiatal hernia, mild gastritis   Family History  Problem Relation Age of Onset  . Coronary artery disease Mother    Social History  Substance Use Topics  . Smoking status: Former Smoker    Types: Cigarettes    Quit date: 11/09/2012  . Smokeless tobacco: Never Used  . Alcohol Use: No    Review of Systems  Unable to perform  ROS: Dementia      Allergies  Review of patient's allergies indicates no known allergies.  Home Medications   Prior to Admission medications   Medication Sig Start Date End Date Taking? Authorizing Provider  albuterol (VENTOLIN HFA) 108 (90 BASE) MCG/ACT inhaler Inhale 2 puffs into the lungs every 6 (six) hours as needed.      Historical Provider, MD  amLODipine (NORVASC) 10 MG tablet Take 1 tablet (10 mg total) by mouth daily. 06/07/13   Dorena Bodo, PA-C  aspirin EC 81 MG tablet Take 81 mg by mouth daily.    Historical Provider, MD  cyanocobalamin (,VITAMIN B-12,) 1000 MCG/ML injection Inject 1 mL (1,000 mcg total) into the muscle every 30 (thirty) days. 02/11/13   Patriciaann Clan Dixon, PA-C  donepezil (ARICEPT) 10 MG tablet Take 10 mg by mouth at bedtime.    Historical Provider, MD  doxycycline (VIBRA-TABS) 100 MG tablet Take 1 tablet (100 mg total) by mouth 2 (two) times daily. 01/10/14   Patriciaann Clan Dixon, PA-C  iron polysaccharides (NU-IRON) 150 MG capsule Take 1 capsule (150 mg total) by mouth 2 (two) times daily. 04/19/13   Patriciaann Clan Dixon, PA-C  LORazepam (ATIVAN) 1 MG tablet Take 1 tablet (1 mg total) by  mouth at bedtime as needed (aggitation). 10/12/13   Catarina Hartshorn, MD  metFORMIN (GLUCOPHAGE) 1000 MG tablet Take 1 tablet (1,000 mg total) by mouth 2 (two) times daily with a meal. 06/11/13   Dorena Bodo, PA-C  pioglitazone (ACTOS) 45 MG tablet Take 1 tablet (45 mg total) by mouth daily. 04/19/13   Mary B Dixon, PA-C   BP 152/77 mmHg  Pulse 70  Temp(Src) 98.1 F (36.7 C) (Oral)  Resp 18  Wt 150 lb (68.04 kg)  SpO2 95% Physical Exam  Nursing note and vitals reviewed.  74 year old male, resting comfortably and in no acute distress. Vital signs are significant for hypertension. Oxygen saturation is 95%, which is normal. Head is normocephalic. PERRLA, EOMI. Oropharynx is clear. Laceration is noted above the left eye, inferior to the eyebrow. Neck is nontender without adenopathy or JVD. Back is  nontender and there is no CVA tenderness. Lungs are clear without rales, wheezes, or rhonchi. Chest is nontender. Heart has regular rate and rhythm without murmur. Abdomen is soft, flat, nontender without masses or hepatosplenomegaly and peristalsis is normoactive. Extremities have no cyanosis or edema, full range of motion is present. Skin tear is present in the proximal left forearm. Minor abrasion is present over the right knee anteriorly. Skin is warm and dry without rash. Neurologic: He is awake and oriented to person but not place or time, cranial nerves are intact, there are no motor or sensory deficits.  ED Course  Procedures (including critical care time) LACERATION REPAIR Performed by: AVWUJ,[email or web address] Authorized by: YNWGN,FAOZH Consent: Verbal consent obtained. Risks and benefits: risks, benefits and alternatives were discussed Consent given by: patient Patient identity confirmed: provided demographic data Prepped and Draped in normal sterile fashion Wound explored  Laceration Location: Left supraorbital  Laceration Length: 4.0 cm  No Foreign Bodies seen or palpated  Anesthesia: None  Amount of cleaning: standard  Skin closure: Close   Technique: Tissue adhesive   Patient tolerance: Patient tolerated the procedure well with no immediate complications.  Imaging Review Ct Head Wo Contrast  10/13/2014   CLINICAL DATA:  Status post fall, with laceration at the left eyebrow. Concern for head or cervical spine injury. Initial encounter.  EXAM: CT HEAD WITHOUT CONTRAST  CT CERVICAL SPINE WITHOUT CONTRAST  TECHNIQUE: Multidetector CT imaging of the head and cervical spine was performed following the standard protocol without intravenous contrast. Multiplanar CT image reconstructions of the cervical spine were also generated.  COMPARISON:  CT of the head performed 09/27/2014, and CT of the cervical spine performed 10/08/2013  FINDINGS: CT HEAD FINDINGS  There is no evidence of  acute infarction, mass lesion, or intra- or extra-axial hemorrhage on CT.  Prominence of the ventricles and sulci reflects moderate cortical volume loss. Mild cerebellar atrophy is noted. Diffuse periventricular and subcortical white matter change likely reflects small vessel ischemic microangiopathy.  The brainstem and fourth ventricle are within normal limits. The basal ganglia are unremarkable in appearance. The cerebral hemispheres demonstrate grossly normal gray-white differentiation. No mass effect or midline shift is seen.  There is no evidence of fracture; visualized osseous structures are unremarkable in appearance. The visualized portions of the orbits are within normal limits. There is mild partial opacification of the right mastoid air cells. Mucosal thickening is noted at the maxillary sinuses, more prominent on the right. The remaining paranasal sinuses and left mastoid air cells are well-aerated. No significant soft tissue abnormalities are seen.  CT CERVICAL SPINE FINDINGS  There is no  evidence of fracture or subluxation. Vertebral bodies demonstrate normal height and alignment. Intervertebral disc spaces are preserved. Prevertebral soft tissues are within normal limits. Scattered small anterior and posterior disc osteophyte complexes are seen along the cervical spine.  The visualized portions of the thyroid gland are unremarkable in appearance. The minimally visualized lung apices are clear. No significant soft tissue abnormalities are seen. Calcification is noted at the carotid bifurcations bilaterally.  IMPRESSION: 1. No evidence of traumatic intracranial injury or fracture. 2. No evidence of fracture or subluxation along the cervical spine. 3. Moderate cortical volume loss and diffuse small vessel ischemic microangiopathy. 4. Mild partial opacification of the right mastoid air cells. Mucosal thickening at the maxillary sinuses, more prominent on the right. 5. Calcification at the carotid  bifurcations bilaterally. Carotid ultrasound could be considered for further evaluation, when and as deemed clinically appropriate.   Electronically Signed   By: Roanna Raider M.D.   On: 10/13/2014 05:04   Ct Cervical Spine Wo Contrast  10/13/2014   CLINICAL DATA:  Status post fall, with laceration at the left eyebrow. Concern for head or cervical spine injury. Initial encounter.  EXAM: CT HEAD WITHOUT CONTRAST  CT CERVICAL SPINE WITHOUT CONTRAST  TECHNIQUE: Multidetector CT imaging of the head and cervical spine was performed following the standard protocol without intravenous contrast. Multiplanar CT image reconstructions of the cervical spine were also generated.  COMPARISON:  CT of the head performed 09/27/2014, and CT of the cervical spine performed 10/08/2013  FINDINGS: CT HEAD FINDINGS  There is no evidence of acute infarction, mass lesion, or intra- or extra-axial hemorrhage on CT.  Prominence of the ventricles and sulci reflects moderate cortical volume loss. Mild cerebellar atrophy is noted. Diffuse periventricular and subcortical white matter change likely reflects small vessel ischemic microangiopathy.  The brainstem and fourth ventricle are within normal limits. The basal ganglia are unremarkable in appearance. The cerebral hemispheres demonstrate grossly normal gray-white differentiation. No mass effect or midline shift is seen.  There is no evidence of fracture; visualized osseous structures are unremarkable in appearance. The visualized portions of the orbits are within normal limits. There is mild partial opacification of the right mastoid air cells. Mucosal thickening is noted at the maxillary sinuses, more prominent on the right. The remaining paranasal sinuses and left mastoid air cells are well-aerated. No significant soft tissue abnormalities are seen.  CT CERVICAL SPINE FINDINGS  There is no evidence of fracture or subluxation. Vertebral bodies demonstrate normal height and alignment.  Intervertebral disc spaces are preserved. Prevertebral soft tissues are within normal limits. Scattered small anterior and posterior disc osteophyte complexes are seen along the cervical spine.  The visualized portions of the thyroid gland are unremarkable in appearance. The minimally visualized lung apices are clear. No significant soft tissue abnormalities are seen. Calcification is noted at the carotid bifurcations bilaterally.  IMPRESSION: 1. No evidence of traumatic intracranial injury or fracture. 2. No evidence of fracture or subluxation along the cervical spine. 3. Moderate cortical volume loss and diffuse small vessel ischemic microangiopathy. 4. Mild partial opacification of the right mastoid air cells. Mucosal thickening at the maxillary sinuses, more prominent on the right. 5. Calcification at the carotid bifurcations bilaterally. Carotid ultrasound could be considered for further evaluation, when and as deemed clinically appropriate.   Electronically Signed   By: Roanna Raider M.D.   On: 10/13/2014 05:04   I have personally reviewed and evaluated these images as part of my medical decision-making.   MDM  Final diagnoses:  Fall at nursing home, initial encounter  Laceration of face, initial encounter  Skin tear of elbow without complication, left, initial encounter  Abrasion of right knee, initial encounter    Fall with laceration to left supraorbital area and skin tear to the left forearm. Review of old records shows a recent ED visit for fall with facial fractures. He will be sent for CT of head and cervical spine. Laceration is closed with tissue adhesive. Skin tears are treated with simple dressing.    Dione Booze, MD 10/13/14 [id number]

## 2014-10-14 DIAGNOSIS — R278 Other lack of coordination: Secondary | ICD-10-CM | POA: Diagnosis not present

## 2014-10-14 DIAGNOSIS — R296 Repeated falls: Secondary | ICD-10-CM | POA: Diagnosis not present

## 2014-10-15 DIAGNOSIS — R296 Repeated falls: Secondary | ICD-10-CM | POA: Diagnosis not present

## 2014-10-15 DIAGNOSIS — R278 Other lack of coordination: Secondary | ICD-10-CM | POA: Diagnosis not present

## 2014-10-17 DIAGNOSIS — R278 Other lack of coordination: Secondary | ICD-10-CM | POA: Diagnosis not present

## 2014-10-17 DIAGNOSIS — R296 Repeated falls: Secondary | ICD-10-CM | POA: Diagnosis not present

## 2014-10-18 DIAGNOSIS — R278 Other lack of coordination: Secondary | ICD-10-CM | POA: Diagnosis not present

## 2014-10-18 DIAGNOSIS — R296 Repeated falls: Secondary | ICD-10-CM | POA: Diagnosis not present

## 2014-10-19 DIAGNOSIS — R278 Other lack of coordination: Secondary | ICD-10-CM | POA: Diagnosis not present

## 2014-10-19 DIAGNOSIS — R296 Repeated falls: Secondary | ICD-10-CM | POA: Diagnosis not present

## 2014-10-20 DIAGNOSIS — R278 Other lack of coordination: Secondary | ICD-10-CM | POA: Diagnosis not present

## 2014-10-20 DIAGNOSIS — R296 Repeated falls: Secondary | ICD-10-CM | POA: Diagnosis not present

## 2014-10-21 DIAGNOSIS — R296 Repeated falls: Secondary | ICD-10-CM | POA: Diagnosis not present

## 2014-10-21 DIAGNOSIS — R278 Other lack of coordination: Secondary | ICD-10-CM | POA: Diagnosis not present

## 2014-10-24 DIAGNOSIS — R278 Other lack of coordination: Secondary | ICD-10-CM | POA: Diagnosis not present

## 2014-10-24 DIAGNOSIS — R296 Repeated falls: Secondary | ICD-10-CM | POA: Diagnosis not present

## 2014-10-25 DIAGNOSIS — R296 Repeated falls: Secondary | ICD-10-CM | POA: Diagnosis not present

## 2014-10-25 DIAGNOSIS — R278 Other lack of coordination: Secondary | ICD-10-CM | POA: Diagnosis not present

## 2014-10-26 DIAGNOSIS — R296 Repeated falls: Secondary | ICD-10-CM | POA: Diagnosis not present

## 2014-10-26 DIAGNOSIS — R278 Other lack of coordination: Secondary | ICD-10-CM | POA: Diagnosis not present

## 2014-10-28 DIAGNOSIS — R278 Other lack of coordination: Secondary | ICD-10-CM | POA: Diagnosis not present

## 2014-10-28 DIAGNOSIS — R296 Repeated falls: Secondary | ICD-10-CM | POA: Diagnosis not present

## 2014-10-29 DIAGNOSIS — R278 Other lack of coordination: Secondary | ICD-10-CM | POA: Diagnosis not present

## 2014-10-29 DIAGNOSIS — R296 Repeated falls: Secondary | ICD-10-CM | POA: Diagnosis not present

## 2014-10-31 DIAGNOSIS — R296 Repeated falls: Secondary | ICD-10-CM | POA: Diagnosis not present

## 2014-10-31 DIAGNOSIS — R278 Other lack of coordination: Secondary | ICD-10-CM | POA: Diagnosis not present

## 2014-11-01 DIAGNOSIS — R296 Repeated falls: Secondary | ICD-10-CM | POA: Diagnosis not present

## 2014-11-01 DIAGNOSIS — R278 Other lack of coordination: Secondary | ICD-10-CM | POA: Diagnosis not present

## 2014-11-02 DIAGNOSIS — R278 Other lack of coordination: Secondary | ICD-10-CM | POA: Diagnosis not present

## 2014-11-02 DIAGNOSIS — R296 Repeated falls: Secondary | ICD-10-CM | POA: Diagnosis not present

## 2014-11-03 DIAGNOSIS — R296 Repeated falls: Secondary | ICD-10-CM | POA: Diagnosis not present

## 2014-11-03 DIAGNOSIS — R278 Other lack of coordination: Secondary | ICD-10-CM | POA: Diagnosis not present

## 2014-11-04 DIAGNOSIS — R296 Repeated falls: Secondary | ICD-10-CM | POA: Diagnosis not present

## 2014-11-04 DIAGNOSIS — R278 Other lack of coordination: Secondary | ICD-10-CM | POA: Diagnosis not present

## 2014-11-06 DIAGNOSIS — R296 Repeated falls: Secondary | ICD-10-CM | POA: Diagnosis not present

## 2014-11-06 DIAGNOSIS — R278 Other lack of coordination: Secondary | ICD-10-CM | POA: Diagnosis not present

## 2014-11-07 DIAGNOSIS — R296 Repeated falls: Secondary | ICD-10-CM | POA: Diagnosis not present

## 2014-11-07 DIAGNOSIS — R278 Other lack of coordination: Secondary | ICD-10-CM | POA: Diagnosis not present

## 2014-12-06 DIAGNOSIS — E119 Type 2 diabetes mellitus without complications: Secondary | ICD-10-CM | POA: Diagnosis not present

## 2014-12-08 DIAGNOSIS — E039 Hypothyroidism, unspecified: Secondary | ICD-10-CM | POA: Diagnosis not present

## 2014-12-08 DIAGNOSIS — E559 Vitamin D deficiency, unspecified: Secondary | ICD-10-CM | POA: Diagnosis not present

## 2015-01-21 ENCOUNTER — Encounter (HOSPITAL_COMMUNITY): Payer: Self-pay | Admitting: Emergency Medicine

## 2015-01-21 ENCOUNTER — Emergency Department (HOSPITAL_COMMUNITY)
Admission: EM | Admit: 2015-01-21 | Discharge: 2015-01-21 | Disposition: A | Discharge status: Home / Self Care | Payer: Medicare Other | Attending: Emergency Medicine | Admitting: Emergency Medicine

## 2015-01-21 DIAGNOSIS — Z79899 Other long term (current) drug therapy: Secondary | ICD-10-CM | POA: Insufficient documentation

## 2015-01-21 DIAGNOSIS — F0390 Unspecified dementia without behavioral disturbance: Secondary | ICD-10-CM | POA: Insufficient documentation

## 2015-01-21 DIAGNOSIS — Z87442 Personal history of urinary calculi: Secondary | ICD-10-CM | POA: Insufficient documentation

## 2015-01-21 DIAGNOSIS — E669 Obesity, unspecified: Secondary | ICD-10-CM | POA: Insufficient documentation

## 2015-01-21 DIAGNOSIS — I10 Essential (primary) hypertension: Secondary | ICD-10-CM | POA: Insufficient documentation

## 2015-01-21 DIAGNOSIS — E119 Type 2 diabetes mellitus without complications: Secondary | ICD-10-CM | POA: Diagnosis not present

## 2015-01-21 DIAGNOSIS — Z7982 Long term (current) use of aspirin: Secondary | ICD-10-CM | POA: Insufficient documentation

## 2015-01-21 DIAGNOSIS — I2510 Atherosclerotic heart disease of native coronary artery without angina pectoris: Secondary | ICD-10-CM | POA: Diagnosis not present

## 2015-01-21 DIAGNOSIS — Z8719 Personal history of other diseases of the digestive system: Secondary | ICD-10-CM | POA: Insufficient documentation

## 2015-01-21 DIAGNOSIS — Z87438 Personal history of other diseases of male genital organs: Secondary | ICD-10-CM | POA: Insufficient documentation

## 2015-01-21 DIAGNOSIS — Y92128 Other place in nursing home as the place of occurrence of the external cause: Secondary | ICD-10-CM | POA: Diagnosis not present

## 2015-01-21 DIAGNOSIS — R296 Repeated falls: Secondary | ICD-10-CM | POA: Insufficient documentation

## 2015-01-21 DIAGNOSIS — E538 Deficiency of other specified B group vitamins: Secondary | ICD-10-CM | POA: Diagnosis not present

## 2015-01-21 DIAGNOSIS — Z87891 Personal history of nicotine dependence: Secondary | ICD-10-CM | POA: Insufficient documentation

## 2015-01-21 DIAGNOSIS — Z8619 Personal history of other infectious and parasitic diseases: Secondary | ICD-10-CM | POA: Diagnosis not present

## 2015-01-21 DIAGNOSIS — S0993XA Unspecified injury of face, initial encounter: Secondary | ICD-10-CM | POA: Diagnosis present

## 2015-01-21 DIAGNOSIS — Y998 Other external cause status: Secondary | ICD-10-CM | POA: Diagnosis not present

## 2015-01-21 DIAGNOSIS — Z9889 Other specified postprocedural states: Secondary | ICD-10-CM | POA: Insufficient documentation

## 2015-01-21 DIAGNOSIS — Y9389 Activity, other specified: Secondary | ICD-10-CM | POA: Insufficient documentation

## 2015-01-21 DIAGNOSIS — J45909 Unspecified asthma, uncomplicated: Secondary | ICD-10-CM | POA: Insufficient documentation

## 2015-01-21 DIAGNOSIS — S01111A Laceration without foreign body of right eyelid and periocular area, initial encounter: Secondary | ICD-10-CM | POA: Diagnosis not present

## 2015-01-21 DIAGNOSIS — Z7984 Long term (current) use of oral hypoglycemic drugs: Secondary | ICD-10-CM | POA: Diagnosis not present

## 2015-01-21 DIAGNOSIS — Z862 Personal history of diseases of the blood and blood-forming organs and certain disorders involving the immune mechanism: Secondary | ICD-10-CM | POA: Insufficient documentation

## 2015-01-21 DIAGNOSIS — W01198A Fall on same level from slipping, tripping and stumbling with subsequent striking against other object, initial encounter: Secondary | ICD-10-CM | POA: Insufficient documentation

## 2015-01-21 NOTE — ED Notes (Signed)
[facility]. Pt brought in by family member for a fall at nursing facility. Family reports that pt frequently falls. Pt noted to have a laceration above RT eye that has 1 steri strip. Bleeding controlled. Facility denies LOC.

## 2015-01-21 NOTE — ED Provider Notes (Signed)
CSN: [id number]     Arrival date & time 01/21/15  [id number] History   First MD Initiated Contact with Patient 01/21/15 1005     Chief Complaint  Patient presents with  . Fall  . Facial Laceration     (Consider location/radiation/quality/duration/timing/severity/associated sxs/prior Treatment) Patient is a 75 y.o. male presenting with fall. The history is provided by a relative. No language interpreter was used.  Fall This is a new problem. The current episode started today. The problem has been unchanged. Pertinent negatives include no vomiting. Nothing aggravates the symptoms. He has tried nothing for the symptoms. The treatment provided no relief.  Pt fell in common area and hit head.  Pt has frequent falls.  Pt is in a nursing home.  No loss of consciousness. Daughter reports pt is normal.  He is nonverbal. He has dementia.  Pt has a cut over right eyelid.  Daughter asking if we can glue area.  Pt had glue to last laceration and it was less traumatic.    Past Medical History  Diagnosis Date  . ED (erectile dysfunction)   . Vitamin D deficiency   . Pancreatitis   . CAD (coronary artery disease)     cath 2001- one vessel occluded (circ); others patent.  EF 55%  . B12 deficiency 06/02/10    174  . Obesity   . Chronic bronchitis   . Emphysema of lung (HCC)   . Blood transfusion   . Asthma   . Anemia, iron deficiency 06/02/10    heme negative, ferritin 5  . Diabetes mellitus   . GERD (gastroesophageal reflux disease)   . Hyperlipidemia   . Hypertension   . Nephrolithiasis   . Diverticulosis   . H. pylori infection 05/08/2010  . Hiatal hernia 05/08/2010    EGD  . Gastritis 05/08/2010    EGD  . Smoker   . Dementia 10/19/2012   Past Surgical History  Procedure Laterality Date  . Knee cartilage surgery    . Cardiac catheterization  01/19/99    showed blockage on"back side"   no stent  . Colonoscopy  2005, 06/07/10    mild diverticulosis  . Upper gastrointestinal endoscopy  06/07/10   2 cm hiatal hernia, mild gastritis   Family History  Problem Relation Age of Onset  . Coronary artery disease Mother    Social History  Substance Use Topics  . Smoking status: Former Smoker    Types: Cigarettes    Quit date: 11/09/2012  . Smokeless tobacco: Never Used  . Alcohol Use: No    Review of Systems  Unable to perform ROS: Dementia  Gastrointestinal: Negative for vomiting.      Allergies  Review of patient's allergies indicates no known allergies.  Home Medications   Prior to Admission medications   Medication Sig Start Date End Date Taking? Authorizing Provider  amLODipine (NORVASC) 10 MG tablet Take 1 tablet (10 mg total) by mouth daily. 06/07/13  Yes Dorena BodoMary B Dixon, PA-C  aspirin EC 81 MG tablet Take 81 mg by mouth daily.   Yes Historical Provider, MD  cyanocobalamin (,VITAMIN B-12,) 1000 MCG/ML injection Inject 1 mL (1,000 mcg total) into the muscle every 30 (thirty) days. 02/11/13  Yes Mary B Dixon, PA-C  donepezil (ARICEPT) 10 MG tablet Take 10 mg by mouth at bedtime.   Yes Historical Provider, MD  ipratropium-albuterol (DUONEB) 0.5-2.5 (3) MG/3ML SOLN Take 3 mLs by nebulization every 6 (six) hours as needed (wheezing/shortness of breath).   Yes  Historical Provider, MD  metFORMIN (GLUCOPHAGE) 1000 MG tablet Take 1 tablet (1,000 mg total) by mouth 2 (two) times daily with a meal. 06/11/13  Yes Dorena Bodo, PA-C  Multiple Vitamins-Minerals (CERTAVITE/ANTIOXIDANTS PO) Take 1 tablet by mouth daily.   Yes Historical Provider, MD  polyvinyl alcohol (LIQUIFILM TEARS) 1.4 % ophthalmic solution Place 1 drop into both eyes 2 (two) times daily.   Yes Historical Provider, MD   BP 128/75 mmHg  Pulse 84  Temp(Src) 97.7 F (36.5 C) (Oral)  Resp 18  Wt 68.04 kg  SpO2 93% Physical Exam  Constitutional: He appears well-developed and well-nourished.  HENT:  Head: Normocephalic.  Right Ear: External ear normal.  Left Ear: External ear normal.  Nose: Nose normal.  Mouth/Throat:  Oropharynx is clear and moist.  1.5 cm laceration face  Eyes: Conjunctivae and EOM are normal. Pupils are equal, round, and reactive to light.  Neck: Normal range of motion.  Pulmonary/Chest: Effort normal.  Abdominal: He exhibits no distension.  Musculoskeletal: Normal range of motion.  Neurological:  Nonverbal, make eye contact, no response  Skin: Skin is warm.  Nursing note and vitals reviewed.   ED Course  .Marland KitchenLaceration Repair Date/Time: 01/21/2015 2:46 PM Performed by: Cheron Schaumann K Authorized by: Cheron Schaumann K Risks and benefits: risks, benefits and alternatives were discussed Consent given by: guardian Body area: head/neck Location details: right eyelid Laceration length: 1.5 cm Foreign bodies: no foreign bodies Vascular damage: no Patient sedated: no Irrigation solution: saline Amount of cleaning: standard Skin closure: glue Patient tolerance: Patient tolerated the procedure well with no immediate complications   (including critical care time) Labs Review Labs Reviewed - No data to display  Imaging Review No results found. I have personally reviewed and evaluated these images and lab results as part of my medical decision-making.   EKG Interpretation None      MDM  Ct scan deferred.  Pt is no code.  I do not think pt has a facial fracture.  Daughter reports they would not want aggressive treatment if injury.   Final diagnoses:  Laceration of right eyelid and periocular area, initial encounter        Elson Areas, PA-C 01/21/15 1448  Donnetta Hutching, MD 01/22/15 1414

## 2015-01-21 NOTE — Discharge Instructions (Signed)

## 2015-01-23 DIAGNOSIS — D649 Anemia, unspecified: Secondary | ICD-10-CM | POA: Diagnosis not present

## 2015-01-23 DIAGNOSIS — Z79899 Other long term (current) drug therapy: Secondary | ICD-10-CM | POA: Diagnosis not present

## 2015-05-18 ENCOUNTER — Emergency Department (HOSPITAL_COMMUNITY): Payer: Medicare Other

## 2015-05-18 ENCOUNTER — Encounter (HOSPITAL_COMMUNITY): Payer: Self-pay | Admitting: *Deleted

## 2015-05-18 ENCOUNTER — Emergency Department (HOSPITAL_COMMUNITY)
Admission: EM | Admit: 2015-05-18 | Discharge: 2015-05-18 | Disposition: A | Discharge status: Home / Self Care | Payer: Medicare Other | Attending: Emergency Medicine | Admitting: Emergency Medicine

## 2015-05-18 DIAGNOSIS — Z23 Encounter for immunization: Secondary | ICD-10-CM | POA: Insufficient documentation

## 2015-05-18 DIAGNOSIS — S0181XA Laceration without foreign body of other part of head, initial encounter: Secondary | ICD-10-CM

## 2015-05-18 DIAGNOSIS — I10 Essential (primary) hypertension: Secondary | ICD-10-CM | POA: Insufficient documentation

## 2015-05-18 DIAGNOSIS — J449 Chronic obstructive pulmonary disease, unspecified: Secondary | ICD-10-CM | POA: Insufficient documentation

## 2015-05-18 DIAGNOSIS — Z7984 Long term (current) use of oral hypoglycemic drugs: Secondary | ICD-10-CM | POA: Insufficient documentation

## 2015-05-18 DIAGNOSIS — Z79899 Other long term (current) drug therapy: Secondary | ICD-10-CM | POA: Diagnosis not present

## 2015-05-18 DIAGNOSIS — E669 Obesity, unspecified: Secondary | ICD-10-CM | POA: Diagnosis not present

## 2015-05-18 DIAGNOSIS — K219 Gastro-esophageal reflux disease without esophagitis: Secondary | ICD-10-CM | POA: Diagnosis not present

## 2015-05-18 DIAGNOSIS — Y999 Unspecified external cause status: Secondary | ICD-10-CM | POA: Insufficient documentation

## 2015-05-18 DIAGNOSIS — S01112A Laceration without foreign body of left eyelid and periocular area, initial encounter: Secondary | ICD-10-CM | POA: Insufficient documentation

## 2015-05-18 DIAGNOSIS — Z87891 Personal history of nicotine dependence: Secondary | ICD-10-CM | POA: Diagnosis not present

## 2015-05-18 DIAGNOSIS — E785 Hyperlipidemia, unspecified: Secondary | ICD-10-CM | POA: Diagnosis not present

## 2015-05-18 DIAGNOSIS — Z7982 Long term (current) use of aspirin: Secondary | ICD-10-CM | POA: Diagnosis not present

## 2015-05-18 DIAGNOSIS — E119 Type 2 diabetes mellitus without complications: Secondary | ICD-10-CM | POA: Diagnosis not present

## 2015-05-18 DIAGNOSIS — F0390 Unspecified dementia without behavioral disturbance: Secondary | ICD-10-CM | POA: Diagnosis not present

## 2015-05-18 DIAGNOSIS — Z7951 Long term (current) use of inhaled steroids: Secondary | ICD-10-CM | POA: Diagnosis not present

## 2015-05-18 DIAGNOSIS — Y939 Activity, unspecified: Secondary | ICD-10-CM | POA: Insufficient documentation

## 2015-05-18 DIAGNOSIS — I2510 Atherosclerotic heart disease of native coronary artery without angina pectoris: Secondary | ICD-10-CM | POA: Insufficient documentation

## 2015-05-18 DIAGNOSIS — Y9289 Other specified places as the place of occurrence of the external cause: Secondary | ICD-10-CM | POA: Insufficient documentation

## 2015-05-18 DIAGNOSIS — W19XXXA Unspecified fall, initial encounter: Secondary | ICD-10-CM | POA: Insufficient documentation

## 2015-05-18 LAB — URINE MICROSCOPIC-ADD ON
Bacteria, UA: NONE SEEN
RBC / HPF: NONE SEEN RBC/hpf (ref 0–5)
SQUAMOUS EPITHELIAL / LPF: NONE SEEN

## 2015-05-18 LAB — URINALYSIS, ROUTINE W REFLEX MICROSCOPIC
BILIRUBIN URINE: NEGATIVE
Glucose, UA: NEGATIVE mg/dL
KETONES UR: NEGATIVE mg/dL
Leukocytes, UA: NEGATIVE
NITRITE: NEGATIVE
Protein, ur: 30 mg/dL — AB
Specific Gravity, Urine: 1.022 (ref 1.005–1.030)
pH: 5 (ref 5.0–8.0)

## 2015-05-18 MED ORDER — LIDOCAINE-EPINEPHRINE (PF) 2 %-1:200000 IJ SOLN
20.0000 mL | Freq: Once | INTRAMUSCULAR | Status: DC
  Filled 2015-05-18: qty 20

## 2015-05-18 MED ORDER — TETANUS-DIPHTH-ACELL PERTUSSIS 5-2.5-18.5 LF-MCG/0.5 IM SUSP
0.5000 mL | Freq: Once | INTRAMUSCULAR | Status: AC
  Administered 2015-05-18: 0.5 mL via INTRAMUSCULAR
  Filled 2015-05-18: qty 0.5

## 2015-05-18 NOTE — ED Notes (Signed)
Bed: [id number] Expected date:  Expected time:  Means of arrival:  Comments: EMS fall/head lac

## 2015-05-18 NOTE — ED Notes (Signed)
Per EMS [name] with c/o fall; per EMS staff reports pt usually ambulates with walker, however was found in the hallway crawling with approx. 3 inch lac above left eye, bleeding controled at this time, as well as abrasion and swelling to bridge of nose, and abrasions to his knees. Pt with advanced dementia, mentation at baseline

## 2015-05-18 NOTE — ED Notes (Signed)
WAITING PTAR

## 2015-05-18 NOTE — ED Provider Notes (Signed)
CSN: [id number]     Arrival date & time 05/18/15  0353 History  By signing my name below, I, Marisue HumbleMichelle Chaffee, attest that this documentation has been prepared under the direction and in the presence of Shon Batonourtney F Horton, MD . Electronically Signed: Marisue HumbleMichelle Chaffee, Scribe. 05/18/2015. 6:26 AM.    Chief Complaint  Patient presents with  . Fall  . Head Laceration   The history is provided by the EMS personnel. The history is limited by the condition of the patient. No language interpreter was used.   Level 5 caveat due to dementia  HPI Comments:  Jonathan [name] is a 75 y.o. male with PMHx of CAD, DM, GERD, HLD, HTN and dementia who presents to the Emergency Department via EMS s/p fall unwitnessed fall earlier tonight. He normally ambulates using a walker and was found crawling in a hallway without his walker tonight. He has a laceration above his left eyebrow. Pt is at baseline mental status currently.  Past Medical History  Diagnosis Date  . ED (erectile dysfunction)   . Vitamin D deficiency   . Pancreatitis   . CAD (coronary artery disease)     cath 2001- one vessel occluded (circ); others patent.  EF 55%  . B12 deficiency 06/02/10    174  . Obesity   . Chronic bronchitis   . Emphysema of lung (HCC)   . Blood transfusion   . Asthma   . Anemia, iron deficiency 06/02/10    heme negative, ferritin 5  . Diabetes mellitus   . GERD (gastroesophageal reflux disease)   . Hyperlipidemia   . Hypertension   . Nephrolithiasis   . Diverticulosis   . H. pylori infection 05/08/2010  . Hiatal hernia 05/08/2010    EGD  . Gastritis 05/08/2010    EGD  . Smoker   . Dementia 10/19/2012   Past Surgical History  Procedure Laterality Date  . Knee cartilage surgery    . Cardiac catheterization  01/19/99    showed blockage on"back side"   no stent  . Colonoscopy  2005, 06/07/10    mild diverticulosis  . Upper gastrointestinal endoscopy  06/07/10    2 cm hiatal hernia, mild gastritis   Family  History  Problem Relation Age of Onset  . Coronary artery disease Mother    Social History  Substance Use Topics  . Smoking status: Former Smoker    Types: Cigarettes    Quit date: 11/09/2012  . Smokeless tobacco: Never Used  . Alcohol Use: No    Review of Systems  Unable to perform ROS: Dementia   Allergies  Review of patient's allergies indicates no known allergies.  Home Medications   Prior to Admission medications   Medication Sig Start Date End Date Taking? Authorizing Provider  amLODipine (NORVASC) 10 MG tablet Take 1 tablet (10 mg total) by mouth daily. 06/07/13  Yes Dorena BodoMary B Dixon, PA-C  aspirin EC 81 MG tablet Take 81 mg by mouth daily.   Yes Historical Provider, MD  cyanocobalamin (,VITAMIN B-12,) 1000 MCG/ML injection Inject 1 mL (1,000 mcg total) into the muscle every 30 (thirty) days. 02/11/13  Yes Mary B Dixon, PA-C  donepezil (ARICEPT) 10 MG tablet Take 10 mg by mouth at bedtime.   Yes Historical Provider, MD  ergocalciferol (VITAMIN D2) 50000 units capsule Take 50,000 Units by mouth every 30 (thirty) days.   Yes Historical Provider, MD  guaiFENesin (MUCINEX) 600 MG 12 hr tablet Take 600 mg by mouth 2 (two) times  daily.   Yes Historical Provider, MD  ipratropium-albuterol (DUONEB) 0.5-2.5 (3) MG/3ML SOLN Take 3 mLs by nebulization every 6 (six) hours.    Yes Historical Provider, MD  metFORMIN (GLUCOPHAGE) 500 MG tablet Take 500-1,000 mg by mouth 2 (two) times daily. Take two tablets in the am ( ) and one tablet every evening ( )   Yes Historical Provider, MD  Multiple Vitamins-Minerals (CERTAVITE/ANTIOXIDANTS PO) Take 1 tablet by mouth daily.   Yes Historical Provider, MD  omeprazole (PRILOSEC) 20 MG capsule Take 20 mg by mouth daily. 04/28/15  Yes Historical Provider, MD  polyvinyl alcohol (LIQUIFILM TEARS) 1.4 % ophthalmic solution Place 1 drop into both eyes 2 (two) times daily.   Yes Historical Provider, MD  metFORMIN (GLUCOPHAGE) 1000 MG tablet Take 1 tablet  (1,000 mg total) by mouth 2 (two) times daily with a meal. Patient not taking: Reported on 05/18/2015 06/11/13   Dorena Bodo, PA-C   BP 158/76 mmHg  Pulse 72  Temp(Src) 97.6 F (36.4 C) (Oral)  Resp 16  SpO2 96% Physical Exam  Constitutional: No distress.  Elderly, nonverbal, no acute distress  HENT:  Right Ear: External ear normal.  Left Ear: External ear normal.  6 cm laceration noted over the left eyebrow, bleeding controlled, no obvious bony deformities  Eyes: Pupils are equal, round, and reactive to light.  Neck: Neck supple.  Cardiovascular: Normal rate, regular rhythm and normal heart sounds.   No murmur heard. Pulmonary/Chest: Effort normal and breath sounds normal. No respiratory distress. He has no wheezes.  Abdominal: Soft. Bowel sounds are normal. There is no tenderness. There is no rebound.  Musculoskeletal:  Normal range of motion of the bilateral hips and knees, no obvious deformities  Neurological: He is alert.  Alert, nonverbal, spontaneously moves all fours  Skin: Skin is warm and dry.  Laceration as above  Psychiatric: He has a normal mood and affect.  Nursing note and vitals reviewed.   ED Course  Procedures   LACERATION REPAIR Performed by: Shon Baton Authorized by: Shon Baton Consent: Verbal consent obtained. Risks and benefits: risks, benefits and alternatives were discussed Consent given by: patient Patient identity confirmed: provided demographic data Prepped and Draped in normal sterile fashion Wound explored  Laceration Location: left eyebrow  Laceration Length: 6cm  No Foreign Bodies seen or palpated  Anesthesia: local infiltration  Local anesthetic: lidocaine 1% w epinephrine  Anesthetic total: 5 ml  Irrigation method: syringe Amount of cleaning: standard  Skin closure: dermabond Deep closure: 4-0 Rapide Vicyrl  Number of sutures: 5  Technique: deep interrupted  Patient tolerance: Patient tolerated the  procedure well with no immediate complications.    COORDINATION OF CARE:  4:01 AM Discussed treatment plan with pt at bedside and pt agreed to plan.  Labs Review Labs Reviewed  URINALYSIS, ROUTINE W REFLEX MICROSCOPIC (NOT AT [facility]) - Abnormal; Notable for the following:    Hgb urine dipstick TRACE (*)    Protein, ur 30 (*)    All other components within normal limits  URINE MICROSCOPIC-ADD ON    Imaging Review Ct Head Wo Contrast  05/18/2015  CLINICAL DATA:  Unwitnessed fall earlier tonight. EXAM: CT HEAD WITHOUT CONTRAST CT CERVICAL SPINE WITHOUT CONTRAST TECHNIQUE: Multidetector CT imaging of the head and cervical spine was performed following the standard protocol without intravenous contrast. Multiplanar CT image reconstructions of the cervical spine were also generated. COMPARISON:  10/13/2014 FINDINGS: CT HEAD FINDINGS There is no intracranial hemorrhage, mass or evidence of  acute infarction. There is moderate generalized atrophy. There is moderate chronic microvascular ischemic change. There is remote lacunar infarction in the pons and right thalamus. There is no significant extra-axial fluid collection. No acute intracranial findings are evident. The calvarium and skullbase are intact. The visible paranasal sinuses and orbits are unremarkable. CT CERVICAL SPINE FINDINGS The vertebral column, pedicles and facet articulations are intact. There is no evidence of acute fracture. No acute soft tissue abnormalities are evident. Mild significant arthritic changes are present. IMPRESSION: 1. No acute intracranial findings. There is moderate generalized atrophy and chronic appearing white matter hypodensities which likely represent small vessel ischemic disease. 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 2. Negative for acute cervical spine fracture. Electronically Signed   By: Ellery Plunk M.D.   On: 05/18/2015 04:54   Ct Cervical Spine Wo Contrast  05/18/2015  CLINICAL  DATA:  Unwitnessed fall earlier tonight. EXAM: CT HEAD WITHOUT CONTRAST CT CERVICAL SPINE WITHOUT CONTRAST TECHNIQUE: Multidetector CT imaging of the head and cervical spine was performed following the standard protocol without intravenous contrast. Multiplanar CT image reconstructions of the cervical spine were also generated. COMPARISON:  10/13/2014 FINDINGS: CT HEAD FINDINGS There is no intracranial hemorrhage, mass or evidence of acute infarction. There is moderate generalized atrophy. There is moderate chronic microvascular ischemic change. There is remote lacunar infarction in the pons and right thalamus. There is no significant extra-axial fluid collection. No acute intracranial findings are evident. The calvarium and skullbase are intact. The visible paranasal sinuses and orbits are unremarkable. CT CERVICAL SPINE FINDINGS The vertebral column, pedicles and facet articulations are intact. There is no evidence of acute fracture. No acute soft tissue abnormalities are evident. Mild significant arthritic changes are present. IMPRESSION: 1. No acute intracranial findings. There is moderate generalized atrophy and chronic appearing white matter hypodensities which likely represent small vessel ischemic disease. 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 2. Negative for acute cervical spine fracture. Electronically Signed   By: Ellery Plunk M.D.   On: 05/18/2015 04:54   I have personally reviewed and evaluated these images and lab results as part of my medical decision-making.   EKG Interpretation   Date/Time:  Thursday May 18 2015 04:17:51 EDT Ventricular Rate:  86 PR Interval:  189 QRS Duration: 98 QT Interval:  356 QTC Calculation: 426 R Axis:   -37 Text Interpretation:  Sinus rhythm Left axis deviation Nonspecific T  abnormalities, lateral leads Confirmed by HORTON  MD, Toni Amend ([id number]) on  05/18/2015 4:48:26 AM      MDM   Final diagnoses:  Fall, initial encounter   Facial laceration, initial encounter    Patient presents following a fall. History of dementia and unknown details of the fall. He is noncontributory to history taking. No acute distress. Obvious laceration. EKG screening EKG and urinalysis obtained and reassuring. CT head and neck without evidence of intracranial bleed. Laceration was repaired at the bedside. Wife was updated. No other signs of trauma. Will discharge back to living facility.  After history, exam, and medical workup I feel the patient has been appropriately medically screened and is safe for discharge home. Pertinent diagnoses were discussed with the patient. Patient was given return precautions.   I personally performed the services described in this documentation, which was scribed in my presence. The recorded information has been reviewed and is accurate.    Shon Baton, MD 05/18/15 [phone]

## 2015-05-18 NOTE — ED Notes (Signed)
[facility] need [facility]

## 2015-05-18 NOTE — Discharge Instructions (Signed)
Your laceration was repaired with absorbable sutures and Dermabond. Wait for Steri-Strips to fall off on their own.  Facial Laceration  A facial laceration is a cut on the face. These injuries can be painful and cause bleeding. Lacerations usually heal quickly, but they need special care to reduce scarring. DIAGNOSIS  Your health care provider will take a medical history, ask for details about how the injury occurred, and examine the wound to determine how deep the cut is. TREATMENT  Some facial lacerations may not require closure. Others may not be able to be closed because of an increased risk of infection. The risk of infection and the chance for successful closure will depend on various factors, including the amount of time since the injury occurred. The wound may be cleaned to help prevent infection. If closure is appropriate, pain medicines may be given if needed. Your health care provider will use stitches (sutures), wound glue (adhesive), or skin adhesive strips to repair the laceration. These tools bring the skin edges together to allow for faster healing and a better cosmetic outcome. If needed, you may also be given a tetanus shot. HOME CARE INSTRUCTIONS  Only take over-the-counter or prescription medicines as directed by your health care provider.  Follow your health care provider's instructions for wound care. These instructions will vary depending on the technique used for closing the wound. For Sutures:  Keep the wound clean and dry.   If you were given a bandage (dressing), you should change it at least once a day. Also change the dressing if it becomes wet or dirty, or as directed by your health care provider.   Wash the wound with soap and water 2 times a day. Rinse the wound off with water to remove all soap. Pat the wound dry with a clean towel.   After cleaning, apply a thin layer of the antibiotic ointment recommended by your health care provider. This will help prevent  infection and keep the dressing from sticking.   You may shower as usual after the first 24 hours. Do not soak the wound in water until the sutures are removed.   Get your sutures removed as directed by your health care provider. With facial lacerations, sutures should usually be taken out after 4-5 days to avoid stitch marks.   Wait a few days after your sutures are removed before applying any makeup. For Skin Adhesive Strips:  Keep the wound clean and dry.   Do not get the skin adhesive strips wet. You may bathe carefully, using caution to keep the wound dry.   If the wound gets wet, pat it dry with a clean towel.   Skin adhesive strips will fall off on their own. You may trim the strips as the wound heals. Do not remove skin adhesive strips that are still stuck to the wound. They will fall off in time.  For Wound Adhesive:  You may briefly wet your wound in the shower or bath. Do not soak or scrub the wound. Do not swim. Avoid periods of heavy sweating until the skin adhesive has fallen off on its own. After showering or bathing, gently pat the wound dry with a clean towel.   Do not apply liquid medicine, cream medicine, ointment medicine, or makeup to your wound while the skin adhesive is in place. This may loosen the film before your wound is healed.   If a dressing is placed over the wound, be careful not to apply tape directly over the  skin adhesive. This may cause the adhesive to be pulled off before the wound is healed.   Avoid prolonged exposure to sunlight or tanning lamps while the skin adhesive is in place.  The skin adhesive will usually remain in place for 5-10 days, then naturally fall off the skin. Do not pick at the adhesive film.  After Healing: Once the wound has healed, cover the wound with sunscreen during the day for 1 full year. This can help minimize scarring. Exposure to ultraviolet light in the first year will darken the scar. It can take 1-2 years  for the scar to lose its redness and to heal completely.  SEEK MEDICAL CARE IF:  You have a fever. SEEK IMMEDIATE MEDICAL CARE IF:  You have redness, pain, or swelling around the wound.   You see ayellowish-white fluid (pus) coming from the wound.    This information is not intended to replace advice given to you by your health care provider. Make sure you discuss any questions you have with your health care provider.   Document Released: 02/01/2004 Document Revised: 01/14/2014 Document Reviewed: 08/06/2012 [employer] Interactive Patient Education [employer]2016 [employer]

## 2015-08-03 ENCOUNTER — Encounter (HOSPITAL_COMMUNITY): Payer: Self-pay

## 2015-08-03 ENCOUNTER — Emergency Department (HOSPITAL_COMMUNITY)
Admission: EM | Admit: 2015-08-03 | Discharge: 2015-08-04 | Disposition: A | Discharge status: Home / Self Care | Payer: Medicare Other | Attending: Emergency Medicine | Admitting: Emergency Medicine

## 2015-08-03 DIAGNOSIS — E785 Hyperlipidemia, unspecified: Secondary | ICD-10-CM | POA: Insufficient documentation

## 2015-08-03 DIAGNOSIS — S01111A Laceration without foreign body of right eyelid and periocular area, initial encounter: Secondary | ICD-10-CM | POA: Insufficient documentation

## 2015-08-03 DIAGNOSIS — E119 Type 2 diabetes mellitus without complications: Secondary | ICD-10-CM | POA: Insufficient documentation

## 2015-08-03 DIAGNOSIS — Z7982 Long term (current) use of aspirin: Secondary | ICD-10-CM | POA: Insufficient documentation

## 2015-08-03 DIAGNOSIS — Y939 Activity, unspecified: Secondary | ICD-10-CM | POA: Diagnosis not present

## 2015-08-03 DIAGNOSIS — Z7984 Long term (current) use of oral hypoglycemic drugs: Secondary | ICD-10-CM | POA: Diagnosis not present

## 2015-08-03 DIAGNOSIS — Y929 Unspecified place or not applicable: Secondary | ICD-10-CM | POA: Insufficient documentation

## 2015-08-03 DIAGNOSIS — Z87891 Personal history of nicotine dependence: Secondary | ICD-10-CM | POA: Diagnosis not present

## 2015-08-03 DIAGNOSIS — Y999 Unspecified external cause status: Secondary | ICD-10-CM | POA: Diagnosis not present

## 2015-08-03 DIAGNOSIS — Z79899 Other long term (current) drug therapy: Secondary | ICD-10-CM | POA: Insufficient documentation

## 2015-08-03 DIAGNOSIS — W1839XA Other fall on same level, initial encounter: Secondary | ICD-10-CM | POA: Insufficient documentation

## 2015-08-03 DIAGNOSIS — I10 Essential (primary) hypertension: Secondary | ICD-10-CM | POA: Diagnosis not present

## 2015-08-03 DIAGNOSIS — I2510 Atherosclerotic heart disease of native coronary artery without angina pectoris: Secondary | ICD-10-CM | POA: Diagnosis not present

## 2015-08-03 DIAGNOSIS — F0390 Unspecified dementia without behavioral disturbance: Secondary | ICD-10-CM | POA: Insufficient documentation

## 2015-08-03 DIAGNOSIS — W19XXXA Unspecified fall, initial encounter: Secondary | ICD-10-CM

## 2015-08-03 DIAGNOSIS — S0101XA Laceration without foreign body of scalp, initial encounter: Secondary | ICD-10-CM

## 2015-08-03 NOTE — ED Provider Notes (Signed)
[facility]-EMERGENCY DEPT Provider Note   CSN: [id number] Arrival date & time: 08/03/15  2225  First Provider Contact:  First MD Initiated Contact with Patient 08/03/15 2356     By signing my name below, I, [name], attest that this documentation has been prepared under the direction and in the presence of Derwood Kaplan, MD. Electronically Signed: [name], ED Scribe. 08/03/15. 12:07 AM.   History   Chief Complaint Chief Complaint  Patient presents with  . Fall    facial laceration   LEVEL 5 CAVEAT for dementia  HPI Jonathan [name] is a 75 y.o. male brought in by ambulance from [facility], with PMHx vascular dementia who presents to the Emergency Department for laceration underneath right eyebrow s/p ground level fall that occurred earlier tonight. Wife reports that she received a call from [facility] at 9:30 PM stating that patient had fallen. Wife states that a CNA was changing the patient when patient fell. No LOC. Per wife, pt has been acting at baseline otherwise. Tetanus up to date. Pt has been in [facility] for the past 18 months and has a hx of recurrent falls due to balance issues.   The history is provided by the spouse. No language interpreter was used.    Past Medical History:  Diagnosis Date  . Anemia, iron deficiency 06/02/10   heme negative, ferritin 5  . Asthma   . B12 deficiency 06/02/10   174  . Blood transfusion   . CAD (coronary artery disease)    cath 2001- one vessel occluded (circ); others patent.  EF 55%  . Chronic bronchitis   . Dementia 10/19/2012  . Diabetes mellitus   . Diverticulosis   . ED (erectile dysfunction)   . Emphysema of lung (HCC)   . Gastritis 05/08/2010   EGD  . GERD (gastroesophageal reflux disease)   . H. pylori infection 05/08/2010  . Hiatal hernia 05/08/2010   EGD  . Hyperlipidemia   . Hypertension   . Nephrolithiasis   . Obesity   . Pancreatitis   . Smoker   . Vitamin D deficiency      Patient Active Problem List   Diagnosis Date Noted  . Dementia with behavioral disturbance 10/09/2013  . UTI (lower urinary tract infection) 10/08/2013  . Acute encephalopathy 10/08/2013  . At risk for falling 02/11/2013  . Hypokalemia 02/03/2013  . Hypomagnesemia 02/03/2013  . SOB (shortness of breath) 02/02/2013  . Chest pain 02/02/2013  . Dementia 10/19/2012  . Smoker   . H. pylori infection   . Hiatal hernia   . Gastritis   . Right carotid bruit 03/01/2011  . Emphysema of lung (HCC)   . Blood transfusion   . Asthma, chronic   . DM (diabetes mellitus), secondary, uncontrolled, with peripheral vascular complications (HCC)   . GERD (gastroesophageal reflux disease)   . Hyperlipidemia   . Hypertension   . Nephrolithiasis   . Diverticulosis   . Anemia, iron deficiency 06/03/2010  . B12 deficiency 06/03/2010  . CAD, NATIVE VESSEL 12/18/2009    Past Surgical History:  Procedure Laterality Date  . CARDIAC CATHETERIZATION  01/19/99   showed blockage on"back side"   no stent  . COLONOSCOPY  2005, 06/07/10   mild diverticulosis  . KNEE CARTILAGE SURGERY    . UPPER GASTROINTESTINAL ENDOSCOPY  06/07/10   2 cm hiatal hernia, mild gastritis       Home Medications    Prior to Admission medications   Medication  Sig Start Date End Date Taking? Authorizing Provider  amLODipine (NORVASC) 10 MG tablet Take 1 tablet (10 mg total) by mouth daily. 06/07/13   Dorena Bodo, PA-C  aspirin EC 81 MG tablet Take 81 mg by mouth daily.    Historical Provider, MD  cyanocobalamin (,VITAMIN B-12,) 1000 MCG/ML injection Inject 1 mL (1,000 mcg total) into the muscle every 30 (thirty) days. 02/11/13   Patriciaann Clan Dixon, PA-C  donepezil (ARICEPT) 10 MG tablet Take 10 mg by mouth at bedtime.    Historical Provider, MD  ergocalciferol (VITAMIN D2) 50000 units capsule Take 50,000 Units by mouth every 30 (thirty) days.    Historical Provider, MD  guaiFENesin (MUCINEX) 600 MG 12 hr tablet Take 600 mg by mouth  2 (two) times daily.    Historical Provider, MD  ipratropium-albuterol (DUONEB) 0.5-2.5 (3) MG/3ML SOLN Take 3 mLs by nebulization every 6 (six) hours.     Historical Provider, MD  metFORMIN (GLUCOPHAGE) 1000 MG tablet Take 1 tablet (1,000 mg total) by mouth 2 (two) times daily with a meal. Patient not taking: Reported on 05/18/2015 06/11/13   Dorena Bodo, PA-C  metFORMIN (GLUCOPHAGE) 500 MG tablet Take 500-1,000 mg by mouth 2 (two) times daily. Take two tablets in the am (1000mg ) and one tablet every evening (500mg )    Historical Provider, MD  Multiple Vitamins-Minerals (CERTAVITE/ANTIOXIDANTS PO) Take 1 tablet by mouth daily.    Historical Provider, MD  omeprazole (PRILOSEC) 20 MG capsule Take 20 mg by mouth daily. 04/28/15   Historical Provider, MD  polyvinyl alcohol (LIQUIFILM TEARS) 1.4 % ophthalmic solution Place 1 drop into both eyes 2 (two) times daily.    Historical Provider, MD    Family History Family History  Problem Relation Age of Onset  . Coronary artery disease Mother     Social History Social History  Substance Use Topics  . Smoking status: Former Smoker    Types: Cigarettes    Quit date: 11/09/2012  . Smokeless tobacco: Never Used  . Alcohol use No     Allergies   Review of patient's allergies indicates no known allergies.   Review of Systems Review of Systems  Unable to perform ROS: Dementia   Physical Exam Updated Vital Signs BP 115/92   Pulse 62   Temp 97.9 F (36.6 C) (Oral)   Resp 17   SpO2 94%   Physical Exam  Constitutional: He appears well-developed and well-nourished. No distress.  HENT:  Head: Normocephalic.  3 cm laceration under the right eyebrow. No significant periorbital ecchymosis at this time. No signs of hematoma to the scalp.   Eyes: Conjunctivae and EOM are normal.  Pupils are 2 mm, equal   Neck: Neck supple. No tracheal deviation present.  Cardiovascular: Normal rate and regular rhythm.   Pulmonary/Chest: Effort normal and breath  sounds normal. No respiratory distress. He exhibits no tenderness.  Anterior lungs are clear  Abdominal: Soft.  Musculoskeletal: Normal range of motion.  Upper extremities and lower extremities don't show any deformity. There is no tenderness in these areas with palpation.   Neurological: He is alert.  Skin: Skin is warm and dry.  Psychiatric: He has a normal mood and affect. His behavior is normal.  Nursing note and vitals reviewed.   ED Treatments / Results   DIAGNOSTIC STUDIES: Oxygen Saturation is 96% on RA, normal by my interpretation.    COORDINATION OF CARE: 12:03 AM-Discussed treatment plan which includes CT Head and laceration repair with wife at  bedside and wife agreed to plan.   12:29 AM-Pt's wife who also has the POA does not want a CT scan of the head becasue even if there is a bleed they would not want intervention done. Will cancel CT Head at this time. Pt is not on anticoagulation. He has no visible tenderness to palpation of the lower extremity and pelvis, so no pelvic imaging is ordered at the moment.  Labs (all labs ordered are listed, but only abnormal results are displayed) Labs Reviewed - No data to display  EKG  EKG Interpretation  Date/Time:  Thursday August 03 2015 22:37:40 EDT Ventricular Rate:  59 PR Interval:    QRS Duration: 108 QT Interval:  434 QTC Calculation: 430 R Axis:   -40 Text Interpretation:  Sinus rhythm Left axis deviation Borderline low voltage, extremity leads Abnormal R-wave progression, early transition since last tracing no significant change Confirmed by Effie Shy  MD, ELLIOTT ([id number]) on 08/03/2015 10:56:31 PM       Radiology No results found.  Procedures Wound closure utilizing adhes only Date/Time: 08/04/2015 12:37 AM Performed by: Derwood Kaplan Authorized by: Derwood Kaplan  Consent: Verbal consent obtained. Consent given by: guardian Patient identity confirmed: arm band Local anesthesia used: no  Anesthesia: Local  anesthesia used: no  Sedation: Patient sedated: no Patient tolerance: Patient tolerated the procedure well with no immediate complications Comments: dermabond and steristrips utilized after cleaning the wound that measures 3 cm.    (including critical care time)  Medications Ordered in ED Medications - No data to display   Initial Impression / Assessment and Plan / ED Course  I have reviewed the triage vital signs and the nursing notes.  Pertinent labs & imaging results that were available during my care of the patient were reviewed by me and considered in my medical decision making (see chart for details).  Clinical Course    I personally performed the services described in this documentation, which was scribed in my presence. The recorded information has been reviewed and is accurate.  Pt comes in post  Fall.  DDx includes: - Mechanical falls - ICH - Fractures - Contusions - Soft tissue injury  Pt demented, not able to provide any history. Exam however reveals no significant tenderness in any specific area. CT head ordered. Wound to be repaired with dermabond as per shared decision making with the wife who has the POA.  Final Clinical Impressions(s) / ED Diagnoses   Final diagnoses:  Fall, initial encounter  Scalp laceration, initial encounter    New Prescriptions New Prescriptions   No medications on file     Derwood Kaplan, MD 08/04/15 [phone]

## 2015-08-03 NOTE — ED Triage Notes (Signed)
Pt is from [facility] where he fell this evening and has a laceration to his right eyebrow.  Bleeding is controlled at this time.

## 2015-08-04 ENCOUNTER — Emergency Department (HOSPITAL_COMMUNITY): Payer: Medicare Other

## 2015-08-04 NOTE — ED Notes (Signed)
Gave report to nurse at [facility], (Swartz, LPN).

## 2015-08-04 NOTE — Discharge Instructions (Signed)
Jonathan [name] had a laceration to the right side after a fall. The laceration has been repaired with dermabond and steri strips. Please avoid any Vaseline or similar petroleum products to the area of the steristrips until after the strips have fallen on their own.  PLEASE SEND Jonathan [name] TO THE ER IF HE IN UNABLE TO STAND UP OR CANT MOVE HIS LEGS DUE TO PAIN.

## 2015-08-04 NOTE — ED Notes (Signed)
Clean laceration to right side of head and physician applied derambond and steri-strip, patient tolerated well.

## 2015-08-04 NOTE — ED Notes (Signed)
Electronic signature was not able for wife to sign, she was given discharge instructions.

## 2015-10-08 ENCOUNTER — Encounter (HOSPITAL_COMMUNITY): Payer: Self-pay | Admitting: *Deleted

## 2015-10-08 ENCOUNTER — Emergency Department (HOSPITAL_COMMUNITY)
Admission: EM | Admit: 2015-10-08 | Discharge: 2015-10-08 | Disposition: A | Discharge status: Home / Self Care | Payer: Medicare Other | Attending: Emergency Medicine | Admitting: Emergency Medicine

## 2015-10-08 ENCOUNTER — Emergency Department (HOSPITAL_COMMUNITY): Payer: Medicare Other

## 2015-10-08 DIAGNOSIS — W07XXXA Fall from chair, initial encounter: Secondary | ICD-10-CM | POA: Diagnosis not present

## 2015-10-08 DIAGNOSIS — Y939 Activity, unspecified: Secondary | ICD-10-CM | POA: Insufficient documentation

## 2015-10-08 DIAGNOSIS — Y999 Unspecified external cause status: Secondary | ICD-10-CM | POA: Diagnosis not present

## 2015-10-08 DIAGNOSIS — Z87891 Personal history of nicotine dependence: Secondary | ICD-10-CM | POA: Diagnosis not present

## 2015-10-08 DIAGNOSIS — Z7984 Long term (current) use of oral hypoglycemic drugs: Secondary | ICD-10-CM | POA: Insufficient documentation

## 2015-10-08 DIAGNOSIS — I2510 Atherosclerotic heart disease of native coronary artery without angina pectoris: Secondary | ICD-10-CM | POA: Insufficient documentation

## 2015-10-08 DIAGNOSIS — Z79899 Other long term (current) drug therapy: Secondary | ICD-10-CM | POA: Diagnosis not present

## 2015-10-08 DIAGNOSIS — Z7982 Long term (current) use of aspirin: Secondary | ICD-10-CM | POA: Diagnosis not present

## 2015-10-08 DIAGNOSIS — Y92129 Unspecified place in nursing home as the place of occurrence of the external cause: Secondary | ICD-10-CM | POA: Insufficient documentation

## 2015-10-08 DIAGNOSIS — J45909 Unspecified asthma, uncomplicated: Secondary | ICD-10-CM | POA: Insufficient documentation

## 2015-10-08 DIAGNOSIS — I10 Essential (primary) hypertension: Secondary | ICD-10-CM | POA: Diagnosis not present

## 2015-10-08 DIAGNOSIS — S0181XA Laceration without foreign body of other part of head, initial encounter: Secondary | ICD-10-CM

## 2015-10-08 DIAGNOSIS — E119 Type 2 diabetes mellitus without complications: Secondary | ICD-10-CM | POA: Diagnosis not present

## 2015-10-08 DIAGNOSIS — W19XXXA Unspecified fall, initial encounter: Secondary | ICD-10-CM

## 2015-10-08 MED ORDER — LIDOCAINE HCL (PF) 1 % IJ SOLN
5.0000 mL | Freq: Once | INTRAMUSCULAR | Status: DC
  Filled 2015-10-08: qty 5

## 2015-10-08 NOTE — Discharge Instructions (Signed)
The stitches are absorbable, but could be removed in 7 days.

## 2015-10-08 NOTE — ED Provider Notes (Signed)
[facility]-EMERGENCY DEPT Provider Note   CSN: [id number] Arrival date & time: 10/08/15  0607     History   Chief Complaint Chief Complaint  Patient presents with  . Fall  . Facial Laceration   Level V caveat due to dementia and nonverbal status. HPI Jonathan [name] is a 75 y.o. male.   Fall   Patient presents after a fall at the nursing home. Laceration to facial area. Reportedly fell out of a chair.  Past Medical History:  Diagnosis Date  . Anemia, iron deficiency 06/02/10   heme negative, ferritin 5  . Asthma   . B12 deficiency 06/02/10   174  . Blood transfusion   . CAD (coronary artery disease)    cath 2001- one vessel occluded (circ); others patent.  EF 55%  . Chronic bronchitis   . Dementia 10/19/2012  . Diabetes mellitus   . Diverticulosis   . ED (erectile dysfunction)   . Emphysema of lung (HCC)   . Gastritis 05/08/2010   EGD  . GERD (gastroesophageal reflux disease)   . H. pylori infection 05/08/2010  . Hiatal hernia 05/08/2010   EGD  . Hyperlipidemia   . Hypertension   . Nephrolithiasis   . Obesity   . Pancreatitis   . Smoker   . Vitamin D deficiency     Patient Active Problem List   Diagnosis Date Noted  . Dementia with behavioral disturbance 10/09/2013  . UTI (lower urinary tract infection) 10/08/2013  . Acute encephalopathy 10/08/2013  . At risk for falling 02/11/2013  . Hypokalemia 02/03/2013  . Hypomagnesemia 02/03/2013  . SOB (shortness of breath) 02/02/2013  . Chest pain 02/02/2013  . Dementia 10/19/2012  . Smoker   . H. pylori infection   . Hiatal hernia   . Gastritis   . Right carotid bruit 03/01/2011  . Emphysema of lung (HCC)   . Blood transfusion   . Asthma, chronic   . DM (diabetes mellitus), secondary, uncontrolled, with peripheral vascular complications (HCC)   . GERD (gastroesophageal reflux disease)   . Hyperlipidemia   . Hypertension   . Nephrolithiasis   . Diverticulosis   . Anemia, iron deficiency 06/03/2010  . B12  deficiency 06/03/2010  . CAD, NATIVE VESSEL 12/18/2009    Past Surgical History:  Procedure Laterality Date  . CARDIAC CATHETERIZATION  01/19/99   showed blockage on"back side"   no stent  . COLONOSCOPY  2005, 06/07/10   mild diverticulosis  . KNEE CARTILAGE SURGERY    . UPPER GASTROINTESTINAL ENDOSCOPY  06/07/10   2 cm hiatal hernia, mild gastritis       Home Medications    Prior to Admission medications   Medication Sig Start Date End Date Taking? Authorizing Provider  amLODipine (NORVASC) 10 MG tablet Take 1 tablet (10 mg total) by mouth daily. 06/07/13  Yes Dorena Bodo, PA-C  aspirin EC 81 MG tablet Take 81 mg by mouth daily.   Yes Historical Provider, MD  cyanocobalamin (,VITAMIN B-12,) 1000 MCG/ML injection Inject 1 mL (1,000 mcg total) into the muscle every 30 (thirty) days. 02/11/13  Yes Dorena Bodo, PA-C  guaiFENesin (MUCINEX) 600 MG 12 hr tablet Take 600 mg by mouth 2 (two) times daily.   Yes Historical Provider, MD  ipratropium-albuterol (DUONEB) 0.5-2.5 (3) MG/3ML SOLN Take 3 mLs by nebulization every 6 (six) hours.    Yes Historical Provider, MD  metFORMIN (GLUCOPHAGE) 1000 MG tablet Take 1 tablet (1,000 mg total) by mouth 2 (two) times daily  with a meal. 06/11/13  Yes Dorena Bodo, PA-C  metFORMIN (GLUCOPHAGE) 500 MG tablet Take 500-1,000 mg by mouth 2 (two) times daily. Take two tablets in the am (1000mg ) and one tablet every evening (500mg )   Yes Historical Provider, MD  Multiple Vitamins-Minerals (CERTAVITE/ANTIOXIDANTS PO) Take 1 tablet by mouth daily.   Yes Historical Provider, MD  omeprazole (PRILOSEC) 20 MG capsule Take 20 mg by mouth daily. 04/28/15  Yes Historical Provider, MD  polyvinyl alcohol (LIQUIFILM TEARS) 1.4 % ophthalmic solution Place 1 drop into both eyes 2 (two) times daily.   Yes Historical Provider, MD  donepezil (ARICEPT) 10 MG tablet Take 10 mg by mouth at bedtime.    Historical Provider, MD  ergocalciferol (VITAMIN D2) 50000 units capsule Take 50,000  Units by mouth every 30 (thirty) days.    Historical Provider, MD    Family History Family History  Problem Relation Age of Onset  . Coronary artery disease Mother     Social History Social History  Substance Use Topics  . Smoking status: Former Smoker    Types: Cigarettes    Quit date: 11/09/2012  . Smokeless tobacco: Never Used  . Alcohol use No     Allergies   Review of patient's allergies indicates no known allergies.   Review of Systems Review of Systems  Unable to perform ROS: Patient nonverbal     Physical Exam Updated Vital Signs BP 130/68 (BP Location: Right Arm)   Pulse 75   Temp 97.4 F (36.3 C) (Oral)   Resp 18   Ht 5\' 10"  (1.778 m)   Wt 160 lb (72.6 kg)   SpO2 95%   BMI 22.96 kg/m   Physical Exam  Constitutional: He appears well-developed.  HENT:  Approximate 5 cm laceration across forehead from between eyebrows 2 over the left eyebrow and eye area. Some swelling and superficial laceration to bridge of nose.  Eyes: Pupils are equal, round, and reactive to light.  Neck:  No midline tenderness.  Cardiovascular: Normal rate.   Pulmonary/Chest: Effort normal.  Abdominal: Soft.  Musculoskeletal: He exhibits no edema or tenderness.  Neurological: He is alert.  Skin: Skin is warm. Capillary refill takes less than 2 seconds.  Psychiatric: He has a normal mood and affect.     ED Treatments / Results  Labs (all labs ordered are listed, but only abnormal results are displayed) Labs Reviewed - No data to display  EKG  EKG Interpretation None       Radiology No results found.  Procedures Procedures (including critical care time)  Medications Ordered in ED Medications  lidocaine (PF) (XYLOCAINE) 1 % injection 5 mL (not administered)     Initial Impression / Assessment and Plan / ED Course  I have reviewed the triage vital signs and the nursing notes.  Pertinent labs & imaging results that were available during my care of the patient  were reviewed by me and considered in my medical decision making (see chart for details).  Clinical Course    Patient with fall and laceration of face. Closed. Patient is DO NOT RESUSCITATE and after discussion with his wife does not want further imaging because it would not be acted on. Will discharge back to [facility].  LACERATION REPAIR Performed by: Billee Cashing Authorized by: Billee Cashing Consent: Verbal consent obtained. Risks and benefits: risks, benefits and alternatives were discussed Consent given by: patient Patient identity confirmed: provided demographic data Prepped and Draped in normal sterile fashion Wound explored  Laceration Location: face  Laceration Length: 5cm  No Foreign Bodies seen or palpated  Anesthesia: local infiltration  Local anesthetic: lidocaine 1%   Anesthetic total: 5 ml  Irrigation method: scrub Amount of cleaning: standard  Skin closure: 5-0 vicryl rapide  Number of sutures: 1 long running with about 10 throws and 2 simple interrupted.  Patient tolerance: Patient tolerated the procedure well with no immediate complications.  Final Clinical Impressions(s) / ED Diagnoses   Final diagnoses:  Fall, initial encounter  Facial laceration, initial encounter    New Prescriptions New Prescriptions   No medications on file     Benjiman CoreNathan Cleveland Yarbro, MD 10/08/15 [phone]

## 2015-10-08 NOTE — ED Notes (Signed)
Pt's wife refuses CT scans and xrays. MD aware.

## 2015-10-08 NOTE — ED Triage Notes (Signed)
[facility] Pt fell & has a LAC to the left brow & just above the left ear.

## 2016-02-02 ENCOUNTER — Inpatient Hospital Stay (HOSPITAL_COMMUNITY)
Admission: EM | Admit: 2016-02-02 | Discharge: 2016-02-07 | DRG: 871 | Disposition: A | Discharge status: Skilled Nursing Facility | Payer: Medicare Other | Attending: Internal Medicine | Admitting: Internal Medicine

## 2016-02-02 ENCOUNTER — Encounter (HOSPITAL_COMMUNITY): Payer: Self-pay | Admitting: Emergency Medicine

## 2016-02-02 ENCOUNTER — Emergency Department (HOSPITAL_COMMUNITY): Payer: Medicare Other

## 2016-02-02 DIAGNOSIS — Z7401 Bed confinement status: Secondary | ICD-10-CM | POA: Diagnosis not present

## 2016-02-02 DIAGNOSIS — F0151 Vascular dementia with behavioral disturbance: Secondary | ICD-10-CM | POA: Diagnosis present

## 2016-02-02 DIAGNOSIS — K219 Gastro-esophageal reflux disease without esophagitis: Secondary | ICD-10-CM | POA: Diagnosis present

## 2016-02-02 DIAGNOSIS — Z515 Encounter for palliative care: Secondary | ICD-10-CM | POA: Diagnosis present

## 2016-02-02 DIAGNOSIS — E1365 Other specified diabetes mellitus with hyperglycemia: Secondary | ICD-10-CM | POA: Diagnosis not present

## 2016-02-02 DIAGNOSIS — G9340 Encephalopathy, unspecified: Secondary | ICD-10-CM | POA: Diagnosis present

## 2016-02-02 DIAGNOSIS — E1151 Type 2 diabetes mellitus with diabetic peripheral angiopathy without gangrene: Secondary | ICD-10-CM | POA: Diagnosis present

## 2016-02-02 DIAGNOSIS — J439 Emphysema, unspecified: Secondary | ICD-10-CM | POA: Diagnosis present

## 2016-02-02 DIAGNOSIS — F0391 Unspecified dementia with behavioral disturbance: Secondary | ICD-10-CM | POA: Diagnosis present

## 2016-02-02 DIAGNOSIS — J9621 Acute and chronic respiratory failure with hypoxia: Secondary | ICD-10-CM | POA: Diagnosis present

## 2016-02-02 DIAGNOSIS — Z87891 Personal history of nicotine dependence: Secondary | ICD-10-CM

## 2016-02-02 DIAGNOSIS — T380X5A Adverse effect of glucocorticoids and synthetic analogues, initial encounter: Secondary | ICD-10-CM | POA: Diagnosis not present

## 2016-02-02 DIAGNOSIS — Z7984 Long term (current) use of oral hypoglycemic drugs: Secondary | ICD-10-CM

## 2016-02-02 DIAGNOSIS — E870 Hyperosmolality and hypernatremia: Secondary | ICD-10-CM

## 2016-02-02 DIAGNOSIS — F03918 Unspecified dementia, unspecified severity, with other behavioral disturbance: Secondary | ICD-10-CM | POA: Diagnosis present

## 2016-02-02 DIAGNOSIS — I63532 Cerebral infarction due to unspecified occlusion or stenosis of left posterior cerebral artery: Secondary | ICD-10-CM | POA: Diagnosis present

## 2016-02-02 DIAGNOSIS — E860 Dehydration: Secondary | ICD-10-CM | POA: Diagnosis present

## 2016-02-02 DIAGNOSIS — A419 Sepsis, unspecified organism: Principal | ICD-10-CM | POA: Diagnosis present

## 2016-02-02 DIAGNOSIS — R410 Disorientation, unspecified: Secondary | ICD-10-CM | POA: Diagnosis present

## 2016-02-02 DIAGNOSIS — J690 Pneumonitis due to inhalation of food and vomit: Secondary | ICD-10-CM | POA: Diagnosis present

## 2016-02-02 DIAGNOSIS — I6330 Cerebral infarction due to thrombosis of unspecified cerebral artery: Secondary | ICD-10-CM | POA: Insufficient documentation

## 2016-02-02 DIAGNOSIS — Z66 Do not resuscitate: Secondary | ICD-10-CM | POA: Diagnosis present

## 2016-02-02 DIAGNOSIS — I639 Cerebral infarction, unspecified: Secondary | ICD-10-CM | POA: Diagnosis not present

## 2016-02-02 DIAGNOSIS — IMO0002 Reserved for concepts with insufficient information to code with codable children: Secondary | ICD-10-CM | POA: Diagnosis present

## 2016-02-02 DIAGNOSIS — Z7982 Long term (current) use of aspirin: Secondary | ICD-10-CM

## 2016-02-02 DIAGNOSIS — I10 Essential (primary) hypertension: Secondary | ICD-10-CM | POA: Diagnosis present

## 2016-02-02 DIAGNOSIS — E1351 Other specified diabetes mellitus with diabetic peripheral angiopathy without gangrene: Secondary | ICD-10-CM | POA: Diagnosis not present

## 2016-02-02 DIAGNOSIS — E872 Acidosis: Secondary | ICD-10-CM | POA: Diagnosis present

## 2016-02-02 DIAGNOSIS — E785 Hyperlipidemia, unspecified: Secondary | ICD-10-CM | POA: Diagnosis present

## 2016-02-02 DIAGNOSIS — E1165 Type 2 diabetes mellitus with hyperglycemia: Secondary | ICD-10-CM | POA: Diagnosis present

## 2016-02-02 DIAGNOSIS — J189 Pneumonia, unspecified organism: Secondary | ICD-10-CM | POA: Diagnosis present

## 2016-02-02 DIAGNOSIS — R4182 Altered mental status, unspecified: Secondary | ICD-10-CM

## 2016-02-02 DIAGNOSIS — R1312 Dysphagia, oropharyngeal phase: Secondary | ICD-10-CM | POA: Diagnosis present

## 2016-02-02 DIAGNOSIS — I2510 Atherosclerotic heart disease of native coronary artery without angina pectoris: Secondary | ICD-10-CM | POA: Diagnosis present

## 2016-02-02 DIAGNOSIS — Z79899 Other long term (current) drug therapy: Secondary | ICD-10-CM | POA: Diagnosis not present

## 2016-02-02 DIAGNOSIS — R400 Somnolence: Secondary | ICD-10-CM | POA: Diagnosis not present

## 2016-02-02 LAB — CBC WITH DIFFERENTIAL/PLATELET
Basophils Absolute: 0 10*3/uL (ref 0.0–0.1)
Basophils Relative: 0 %
Eosinophils Absolute: 0 10*3/uL (ref 0.0–0.7)
Eosinophils Relative: 0 %
HCT: 42 % (ref 39.0–52.0)
Hemoglobin: 12.7 g/dL — ABNORMAL LOW (ref 13.0–17.0)
Lymphocytes Relative: 11 %
Lymphs Abs: 1.9 10*3/uL (ref 0.7–4.0)
MCH: 29.1 pg (ref 26.0–34.0)
MCHC: 30.2 g/dL (ref 30.0–36.0)
MCV: 96.3 fL (ref 78.0–100.0)
Monocytes Absolute: 0.9 10*3/uL (ref 0.1–1.0)
Monocytes Relative: 5 %
Neutro Abs: 14.2 10*3/uL — ABNORMAL HIGH (ref 1.7–7.7)
Neutrophils Relative %: 84 %
Platelets: 315 10*3/uL (ref 150–400)
RBC: 4.36 MIL/uL (ref 4.22–5.81)
RDW: 15.1 % (ref 11.5–15.5)
WBC: 17 10*3/uL — ABNORMAL HIGH (ref 4.0–10.5)

## 2016-02-02 LAB — OSMOLALITY, URINE: Osmolality, Ur: 771 mOsm/kg (ref 300–900)

## 2016-02-02 LAB — CREATININE, SERUM
Creatinine, Ser: 1.08 mg/dL (ref 0.61–1.24)
GFR calc Af Amer: >60 mL/min (ref >60)
GFR calc non Af Amer: >60 mL/min (ref >60)

## 2016-02-02 LAB — URINALYSIS, ROUTINE W REFLEX MICROSCOPIC
Bacteria, UA: NONE SEEN
Bilirubin Urine: NEGATIVE
Glucose, UA: >=500 mg/dL — AB
Hgb urine dipstick: NEGATIVE
Ketones, ur: NEGATIVE mg/dL
Leukocytes, UA: NEGATIVE
Nitrite: NEGATIVE
Protein, ur: NEGATIVE mg/dL
Specific Gravity, Urine: 1.021 (ref 1.005–1.030)
pH: 5 (ref 5.0–8.0)

## 2016-02-02 LAB — I-STAT CG4 LACTIC ACID, ED
Lactic Acid, Venous: 3 mmol/L (ref 0.5–1.9)
Lactic Acid, Venous: 2.07 mmol/L (ref 0.5–1.9)

## 2016-02-02 LAB — CBC
HEMATOCRIT: 40.1 % (ref 39.0–52.0)
Hemoglobin: 11.9 g/dL — ABNORMAL LOW (ref 13.0–17.0)
MCH: 29.2 pg (ref 26.0–34.0)
MCHC: 29.7 g/dL — AB (ref 30.0–36.0)
MCV: 98.5 fL (ref 78.0–100.0)
Platelets: 283 10*3/uL (ref 150–400)
RBC: 4.07 MIL/uL — ABNORMAL LOW (ref 4.22–5.81)
RDW: 15.3 % (ref 11.5–15.5)
WBC: 15.2 10*3/uL — ABNORMAL HIGH (ref 4.0–10.5)

## 2016-02-02 LAB — OSMOLALITY: Osmolality: 337 mOsm/kg (ref 275–295)

## 2016-02-02 LAB — COMPREHENSIVE METABOLIC PANEL
ALBUMIN: 2.8 g/dL — AB (ref 3.5–5.0)
ALT: 18 U/L (ref 17–63)
AST: 17 U/L (ref 15–41)
Alkaline Phosphatase: 95 U/L (ref 38–126)
Anion gap: 8 (ref 5–15)
BUN: 38 mg/dL — AB (ref 6–20)
CO2: 26 mmol/L (ref 22–32)
Calcium: 8.9 mg/dL (ref 8.9–10.3)
Chloride: 120 mmol/L — ABNORMAL HIGH (ref 101–111)
Creatinine, Ser: 1.23 mg/dL (ref 0.61–1.24)
GFR calc Af Amer: >60 mL/min (ref >60)
GFR, EST NON AFRICAN AMERICAN: 56 mL/min — AB (ref >60)
Glucose, Bld: 265 mg/dL — ABNORMAL HIGH (ref 65–99)
Potassium: 3.7 mmol/L (ref 3.5–5.1)
SODIUM: 154 mmol/L — AB (ref 135–145)
Total Bilirubin: 0.3 mg/dL (ref 0.3–1.2)
Total Protein: 6.5 g/dL (ref 6.5–8.1)

## 2016-02-02 LAB — MRSA PCR SCREENING: MRSA by PCR: NEGATIVE

## 2016-02-02 LAB — INFLUENZA PANEL BY PCR (TYPE A & B)
Influenza A By PCR: NEGATIVE
Influenza B By PCR: NEGATIVE

## 2016-02-02 LAB — GLUCOSE, CAPILLARY: GLUCOSE-CAPILLARY: 261 mg/dL — AB (ref 65–99)

## 2016-02-02 LAB — SODIUM, URINE, RANDOM: Sodium, Ur: 133 mmol/L

## 2016-02-02 MED ORDER — VANCOMYCIN HCL IN DEXTROSE 1-5 GM/200ML-% IV SOLN
1000.0000 mg | Freq: Once | INTRAVENOUS | Status: DC
  Filled 2016-02-02: qty 200

## 2016-02-02 MED ORDER — INSULIN ASPART 100 UNIT/ML ~~LOC~~ SOLN
0.0000 [IU] | Freq: Every day | SUBCUTANEOUS | Status: DC
  Administered 2016-02-02: 3 [IU] via SUBCUTANEOUS

## 2016-02-02 MED ORDER — PREDNISONE 20 MG PO TABS
40.0000 mg | ORAL_TABLET | Freq: Every day | ORAL | Status: AC
  Administered 2016-02-03 – 2016-02-04 (×2): 40 mg via ORAL
  Filled 2016-02-02 (×2): qty 2

## 2016-02-02 MED ORDER — SODIUM CHLORIDE 0.9 % IV SOLN
1.5000 g | Freq: Four times a day (QID) | INTRAVENOUS | Status: DC
  Filled 2016-02-02 (×2): qty 1.5

## 2016-02-02 MED ORDER — VANCOMYCIN HCL IN DEXTROSE 1-5 GM/200ML-% IV SOLN
1000.0000 mg | Freq: Once | INTRAVENOUS | Status: AC
  Administered 2016-02-02: 1000 mg via INTRAVENOUS
  Filled 2016-02-02: qty 200

## 2016-02-02 MED ORDER — VANCOMYCIN HCL 500 MG IV SOLR
500.0000 mg | Freq: Two times a day (BID) | INTRAVENOUS | Status: DC
  Administered 2016-02-03 – 2016-02-04 (×3): 500 mg via INTRAVENOUS
  Filled 2016-02-02 (×3): qty 500

## 2016-02-02 MED ORDER — ARFORMOTEROL TARTRATE 15 MCG/2ML IN NEBU
15.0000 ug | INHALATION_SOLUTION | Freq: Two times a day (BID) | RESPIRATORY_TRACT | Status: DC
  Administered 2016-02-02 – 2016-02-07 (×10): 15 ug via RESPIRATORY_TRACT
  Filled 2016-02-02 (×11): qty 2

## 2016-02-02 MED ORDER — SODIUM CHLORIDE 0.9 % IV BOLUS (SEPSIS)
1000.0000 mL | Freq: Once | INTRAVENOUS | Status: AC
  Administered 2016-02-02: 1000 mL via INTRAVENOUS

## 2016-02-02 MED ORDER — OCUSOFT LID SCRUB EX PADS
1.0000 | MEDICATED_PAD | Freq: Two times a day (BID) | CUTANEOUS | Status: DC
  Filled 2016-02-02: qty 1

## 2016-02-02 MED ORDER — CYCLOSPORINE 0.05 % OP EMUL
1.0000 [drp] | Freq: Two times a day (BID) | OPHTHALMIC | Status: DC
  Administered 2016-02-02 – 2016-02-07 (×10): 1 [drp] via OPHTHALMIC
  Filled 2016-02-02 (×11): qty 1

## 2016-02-02 MED ORDER — ALBUTEROL SULFATE (2.5 MG/3ML) 0.083% IN NEBU: 3.0000 mL | INHALATION_SOLUTION | RESPIRATORY_TRACT | Status: DC | PRN

## 2016-02-02 MED ORDER — GUAIFENESIN 100 MG PO PACK: 600.0000 mg | PACK | Freq: Two times a day (BID) | ORAL | Status: DC | PRN

## 2016-02-02 MED ORDER — ASPIRIN 81 MG PO CHEW
81.0000 mg | CHEWABLE_TABLET | Freq: Every day | ORAL | Status: DC
  Administered 2016-02-03 – 2016-02-06 (×4): 81 mg via ORAL
  Filled 2016-02-02 (×5): qty 1

## 2016-02-02 MED ORDER — INSULIN ASPART 100 UNIT/ML ~~LOC~~ SOLN
0.0000 [IU] | Freq: Three times a day (TID) | SUBCUTANEOUS | Status: DC
  Administered 2016-02-03: 3 [IU] via SUBCUTANEOUS

## 2016-02-02 MED ORDER — PIPERACILLIN-TAZOBACTAM 3.375 G IVPB
3.3750 g | Freq: Once | INTRAVENOUS | Status: AC
  Administered 2016-02-02: 3.375 g via INTRAVENOUS
  Filled 2016-02-02: qty 50

## 2016-02-02 MED ORDER — GUAIFENESIN ER 600 MG PO TB12: 600.0000 mg | ORAL_TABLET | Freq: Two times a day (BID) | ORAL | Status: DC | PRN

## 2016-02-02 MED ORDER — CEFEPIME HCL 2 G IJ SOLR
2.0000 g | Freq: Once | INTRAMUSCULAR | Status: DC
  Administered 2016-02-02: 2 g via INTRAVENOUS
  Filled 2016-02-02: qty 2

## 2016-02-02 MED ORDER — SODIUM CHLORIDE 0.9 % IV BOLUS (SEPSIS)
100.0000 mL | Freq: Once | INTRAVENOUS | Status: AC
  Administered 2016-02-02: 100 mL via INTRAVENOUS

## 2016-02-02 MED ORDER — ENOXAPARIN SODIUM 40 MG/0.4ML ~~LOC~~ SOLN
40.0000 mg | SUBCUTANEOUS | Status: DC
  Administered 2016-02-02 – 2016-02-06 (×5): 40 mg via SUBCUTANEOUS
  Filled 2016-02-02 (×5): qty 0.4

## 2016-02-02 MED ORDER — PANTOPRAZOLE SODIUM 40 MG PO TBEC
40.0000 mg | DELAYED_RELEASE_TABLET | Freq: Every day | ORAL | Status: DC
  Administered 2016-02-03 – 2016-02-06 (×4): 40 mg via ORAL
  Filled 2016-02-02 (×4): qty 1

## 2016-02-02 MED ORDER — IPRATROPIUM-ALBUTEROL 0.5-2.5 (3) MG/3ML IN SOLN
3.0000 mL | Freq: Three times a day (TID) | RESPIRATORY_TRACT | Status: DC
  Administered 2016-02-02 – 2016-02-05 (×8): 3 mL via RESPIRATORY_TRACT
  Filled 2016-02-02 (×8): qty 3

## 2016-02-02 MED ORDER — SODIUM CHLORIDE 0.45 % IV SOLN
INTRAVENOUS | Status: DC
  Administered 2016-02-02: 19:00:00 via INTRAVENOUS

## 2016-02-02 MED ORDER — PIPERACILLIN-TAZOBACTAM 3.375 G IVPB
3.3750 g | Freq: Three times a day (TID) | INTRAVENOUS | Status: DC
  Administered 2016-02-03 – 2016-02-05 (×7): 3.375 g via INTRAVENOUS
  Filled 2016-02-02 (×9): qty 50

## 2016-02-02 NOTE — H&P (Signed)
History and Physical   MAMORU TAKESHITA ZOX:[id number] DOB: 11-21-1940 DOA: 02/02/2016  Referring MD/NP/PA: Eyvonne Mechanic, PA, EDP PCP: Colon Branch, MD  Patient coming from: SNF  Chief Complaint: Fever, cough  HPI: Jonathan [name] is a 76 y.o. male with a history of COPD and advanced dementia who presents from SNF for fever and cough.   History is obtained from medical records and patient's wife due to patients confusion from nonverbal advanced dementia. The problem began 10 days PTA when SNF staff noted a fever, reportedly abnormal lung findings on exam prompting work up that included WBC 19k, negative CXR, UA, and flu swab as well as elevated creatinine. He was given 2L of IVF and daily IV cipro. He continued to take little by mouth but had subjective improvement in symptoms. He was more interactive and vital signs were reportedly normal. 4 days ago, however a nurse that sees him and his wife who feeds him twice daily at the SNF reports he became febrile again and would not swallow anything. He would take it in his mouth and spit it out. He continued to have "rattling" lung sounds which are his baseline. She denies seeing a choking event, but he's required 3L of oxygen by nasal cannula whereas his usual is 2LPM. WBC at that time was reportedly 13k. IV fluids, steroids and antibiotics were started at that time. Today symptoms continued to worsen so EMS was called. At time of assessment SpO2 was in 80%'s, requiring increased oxygen to 4LPM.   ED Course: On arrival temp 99.65F, he was tachypneic, saturating 93% on 4L by nasal cannula. There were rales on exam with poor inspiratory effort. Na 154, Cr 1.23, WBC 17, lactate 3. CXR was normal. Sepsis protocol was begun with blood cultures, IVF resuscitation, broad spectrum antibiotics, and TRH called to admit for sepsis.   Review of Systems: His wife denies dysphagia, focal weakness, vomiting, diarrhea. + Decreased urinary frequency. All other systems  reviewed and are negative.   Past Medical History:  Diagnosis Date  . Anemia, iron deficiency 06/02/10   heme negative, ferritin 5  . Asthma   . B12 deficiency 06/02/10   174  . Blood transfusion   . CAD (coronary artery disease)    cath 2001- one vessel occluded (circ); others patent.  EF 55%  . Chronic bronchitis   . Dementia 10/19/2012  . Diabetes mellitus   . Diverticulosis   . ED (erectile dysfunction)   . Emphysema of lung (HCC)   . Gastritis 05/08/2010   EGD  . GERD (gastroesophageal reflux disease)   . H. pylori infection 05/08/2010  . Hiatal hernia 05/08/2010   EGD  . Hyperlipidemia   . Hypertension   . Nephrolithiasis   . Obesity   . Pancreatitis   . Smoker   . Vitamin D deficiency     Past Surgical History:  Procedure Laterality Date  . CARDIAC CATHETERIZATION  01/19/99   showed blockage on"back side"   no stent  . COLONOSCOPY  2005, 06/07/10   mild diverticulosis  . KNEE CARTILAGE SURGERY    . UPPER GASTROINTESTINAL ENDOSCOPY  06/07/10   2 cm hiatal hernia, mild gastritis    - Lives at [facility], wife comes twice daily. No smoking, EtOH, illicit drug use.   No Known Allergies  Family History  Problem Relation Age of Onset  . Coronary artery disease Mother    - Family history otherwise reviewed and not pertinent.  Prior to Admission medications  Medication Sig Start Date End Date Taking? Authorizing Provider  albuterol (PROVENTIL) (2.5 MG/3ML) 0.083% nebulizer solution Take 3 mLs by nebulization every 4 (four) hours as needed for shortness of breath or wheezing. 01/24/16  Yes Historical Provider, MD  amLODipine (NORVASC) 10 MG tablet Take 1 tablet (10 mg total) by mouth daily. Patient taking differently: Take 10 mg by mouth daily with breakfast.  06/07/13  Yes Dorena Bodo, PA-C  aspirin 81 MG chewable tablet Chew 81 mg by mouth daily with breakfast.   Yes Historical Provider, MD  BROVANA 15 MCG/2ML NEBU Take 1 vial by nebulization 2 (two) times daily. 12/11/15  Yes  Historical Provider, MD  cyanocobalamin (,VITAMIN B-12,) 1000 MCG/ML injection Inject 1 mL (1,000 mcg total) into the muscle every 30 (thirty) days. Patient taking differently: Inject 1,000 mcg into the muscle every 30 (thirty) days. Takes on the 19th of every month 02/11/13  Yes Dorena Bodo, PA-C  cycloSPORINE (RESTASIS) 0.05 % ophthalmic emulsion Place 1 drop into both eyes 2 (two) times daily.   Yes Historical Provider, MD  donepezil (ARICEPT) 10 MG tablet Take 10 mg by mouth at bedtime.   Yes Historical Provider, MD  ergocalciferol (VITAMIN D2) 50000 units capsule Take 50,000 Units by mouth every 30 (thirty) days. Takes on the 19th of every month   Yes Historical Provider, MD  Eyelid Cleansers (OCUSOFT LID SCRUB) PADS Place 1 each into both eyes 2 (two) times daily.   Yes Historical Provider, MD  Guaifenesin (MUCINEX FOR KIDS) 100 MG PACK Take 600 mg by mouth every 12 (twelve) hours.   Yes Historical Provider, MD  ipratropium-albuterol (DUONEB) 0.5-2.5 (3) MG/3ML SOLN Take 3 mLs by nebulization every 8 (eight) hours.    Yes Historical Provider, MD  levofloxacin (LEVAQUIN) 500 MG/100ML SOLN Inject 500 mg into the vein daily. Started 01/22 for 5 days   Yes Historical Provider, MD  metFORMIN (GLUCOPHAGE) 500 MG tablet Take 500-1,000 mg by mouth 2 (two) times daily. Takes 1000mg  in the morning and 500mg  in the evening   Yes Historical Provider, MD  Multiple Vitamins-Minerals (CERTAVITE/ANTIOXIDANTS PO) Take 1 tablet by mouth daily with breakfast.    Yes Historical Provider, MD  omeprazole (PRILOSEC) 20 MG capsule Take 20 mg by mouth daily before breakfast.  04/28/15  Yes Historical Provider, MD  OXYGEN Inhale 2 L/min into the lungs continuous.   Yes Historical Provider, MD  predniSONE (DELTASONE) 20 MG tablet Take 40 mg by mouth daily. Started 01/24 for 5 days   Yes Historical Provider, MD  saccharomyces boulardii (FLORASTOR) 250 MG capsule Take 250 mg by mouth daily. Started 01/17 for 28 days   Yes  Historical Provider, MD  metFORMIN (GLUCOPHAGE) 1000 MG tablet Take 1 tablet (1,000 mg total) by mouth 2 (two) times daily with a meal. Patient not taking: Reported on 02/02/2016 06/11/13   Dorena Bodo, PA-C    Physical Exam: Vitals:   02/02/16 1223 02/02/16 1340 02/02/16 1400 02/02/16 1449  BP:   128/61 129/68  Pulse:   81 76  Resp:   21 22  Temp: 99.4 F (37.4 C)     TempSrc: Rectal     SpO2:   93% 93%  Weight:      Height:  5\' 9"  (1.753 m)     Constitutional: Elderly male in no distress, calm demeanor Eyes: Lids and conjunctivae normal, PERRL ENMT: Mucous membranes are moist. Visualized portions of posterior pharynx clear of any exudate or lesions. Poor dentition.  Neck: Leftward torticollis, supple, no thyromegaly Respiratory: Mildly labored breathing on 4L by nasal cannula, anterior breaqth sounds with transmitted upper airway secretions but no focal wheezing/crackles/rhonchi. Cardiovascular: Regular rate and rhythm, no murmurs, rubs, or gallops. No carotid bruits. No JVD. No LE edema. 1+ pedal pulses. Abdomen: Normoactive bowel sounds. No tenderness, non-distended, and no masses palpated. No hepatosplenomegaly. GU: Condom catheter Musculoskeletal: No clubbing / cyanosis. No joint deformity upper and lower extremities. Decreased muscle tone.  Skin: Warm, dry. Erythematous sacrum without tissue breakdown. No other significant lesions noted.  Neurologic: CN II-XII grossly intact. Gait not assessed. Nonverbal. No noted focal deficits in motor strength or sensation on limited exam, though patient not cooperative with exam  Psychiatric: Alert and disoriented. UTD due to nonverbal/confusion.  Labs on Admission: I have personally reviewed following labs and imaging studies  CBC:  Recent Labs Lab 02/02/16 1312  WBC 17.0*  NEUTROABS 14.2*  HGB 12.7*  HCT 42.0  MCV 96.3  PLT 315   Basic Metabolic Panel:  Recent Labs Lab 02/02/16 1312  NA 154*  K 3.7  CL 120*  CO2 26    GLUCOSE 265*  BUN 38*  CREATININE 1.23  CALCIUM 8.9   GFR: Estimated Creatinine Clearance: 51.3 mL/min (by C-G formula based on SCr of 1.23 mg/dL). Liver Function Tests:  Recent Labs Lab 02/02/16 1312  AST 17  ALT 18  ALKPHOS 95  BILITOT 0.3  PROT 6.5  ALBUMIN 2.8*   Urine analysis:    Component Value Date/Time   COLORURINE YELLOW 05/18/2015 0519   APPEARANCEUR CLEAR 05/18/2015 0519   LABSPEC 1.022 05/18/2015 0519   PHURINE 5.0 05/18/2015 0519   GLUCOSEU NEGATIVE 05/18/2015 0519   HGBUR TRACE (A) 05/18/2015 0519   BILIRUBINUR NEGATIVE 05/18/2015 0519   KETONESUR NEGATIVE 05/18/2015 0519   PROTEINUR 30 (A) 05/18/2015 0519   UROBILINOGEN 0.2 09/27/2014 0610   NITRITE NEGATIVE 05/18/2015 0519   LEUKOCYTESUR NEGATIVE 05/18/2015 0519   Radiological Exams on Admission: Dg Chest 2 View  Result Date: 02/02/2016 CLINICAL DATA:  Elevated white blood cell count EXAM: CHEST  2 VIEW COMPARISON:  02/02/2013 FINDINGS: Cardiac shadow is within normal limits. The lungs are well aerated bilaterally. No focal infiltrate or sizable effusion is seen. No bony abnormality is noted. IMPRESSION: No active cardiopulmonary disease. Electronically Signed   By: Alcide Clever M.D.   On: 02/02/2016 13:04    EKG: Independently reviewed. NSR with LAFB and artifact. No ST depression/elevation.   Assessment/Plan Active Problems:   Pneumonia   Sepsis: Neutrophilic leukocytosis, tachypnea, history of fever. Primary concern for aspiration pneumonia due to recurrent fever in the absence of cutaneous or urinary source, despite negative CXR.  - 30cc/kg IVF given with lactate improvement 3. > 2.07 - Vanc/zosyn started in ED. Will get MRSA swab. Would continue anaerobic coverage for possible aspiration. - SLP evaluation - Aspiration precautions - Maintenance IV fluids - Repeat flu swab, empiric droplet precautions - Monitor blood cultures - Checking UA and culture  Acute on chronic hypoxemic  respiratory failure: Presumed aspiration pneumonia in the setting of end-stage COPD.  - Continue oxygen by nasal cannula  - Treating pneumonia  COPD:  - Continue brovana, duonebs and albuterol prn. - Continue prednisone burst started 1/24.  Hypernatremia due to dehydration: Due to poor po and increased insensible losses. Despite IVF at SNF. Creatinine 1.23 on admission, which is possibly baseline.  - IVF's as above.  - Urine osmolality, urine sodium  DM: Hyperglycemia due to steroids.  -  Hold metformin (likely contributing to lactic acidemia) - SSI  GERD:  - Continue PPI, though may consider stopping this to reduce pneumonia risk  Advanced dementia: Rapid progression, presumed vascular etiology, Dx only a few years ago per wife.  - Continue aricept  - Confirmed pt's wishes to be DNR/DNI - I believe ongoing discussions with palliative care would be of benefit, and wife agrees. She had a good experience with the team here when her parent passed.  HTN: Borderline low BP in ED - Will hold norvasc  DVT prophylaxis: Lovenox  Code Status: DNR  Family Communication: Wife at bedside Disposition Plan: DC back to SNF when stable Consults called: None - Palliative care consult order placed in EPIC  Admission status: Inpatient    Hazeline Junkeryan Tamon Parkerson, MD [facility] Pager [phone]  If 7PM-7AM, please contact night-coverage [email or web address] Password [email or web address] 02/02/2016, 4:00 PM

## 2016-02-02 NOTE — Progress Notes (Addendum)
Pharmacy Antibiotic Note  Jonathan [name] is a 76 y.o. male admitted on 02/02/2016 with fever, has a history of dementia (nonverbal), nonabulatory.  Treated earlier in the week with levaquin for presumed pna and has been getting sicker.  Pharmacy has been consulted for vanomycin dosing for HCAP.  Plan: Vancomycin 1gm IV x 1 in ED then 500mg  IV q12h Zosyn 3.375g IV Q8H infused over 4hrs. Follow renal function, cultures and clinical course  Height: 5\' 9"  (175.3 cm) Weight: 152 lb (68.9 kg) IBW/kg (Calculated) : 70.7  Temp (24hrs), Avg:99.4 F (37.4 C), Min:99.4 F (37.4 C), Max:99.4 F (37.4 C)   Recent Labs Lab 02/02/16 1312 02/02/16 1327  WBC 17.0*  --   CREATININE 1.23  --   LATICACIDVEN  --  3.00*    Estimated Creatinine Clearance: 51.3 mL/min (by C-G formula based on SCr of 1.23 mg/dL).    No Known Allergies  Antimicrobials this admission: 1/26 cefepime x1 1/26 zosyn >> 1/26 vancomycin >>    Microbiology results: 1/26 BCx: sent 1/26 UCx: sent  Thank you for allowing pharmacy to be a part of this patient's care.  Arley PhenixEllen Vallorie Niccoli RPh 02/02/2016, 2:38 PM Pager [phone]

## 2016-02-02 NOTE — ED Triage Notes (Signed)
[facility], kidney function and fever. Pt has been on antibiotics a week and 3 days ago. Pt has had negative chest xray, flu swab and urinalysis. Pt has hx of dementia and is nonverbal. Pt is warm to touch.   24G in right wrist from facitlty where receiving fluids and IV antibiotics.  18 G left arm from EMS.   Pt usually on 3L [place], placed on 4L [place] with EMS.

## 2016-02-02 NOTE — ED Notes (Signed)
Maxipime antibiotic stopped due to discontinued per PA.

## 2016-02-02 NOTE — ED Notes (Signed)
Pt has not urinated

## 2016-02-02 NOTE — ED Notes (Signed)
Bed: WA01 Expected date:  Expected time:  Means of arrival:  Comments: 76 yo M, fever, elevated WBC

## 2016-02-02 NOTE — ED Notes (Signed)
Placed urinal for pt.  Waiting on urine.

## 2016-02-02 NOTE — ED Notes (Signed)
Rand at bedside drawing labs.

## 2016-02-02 NOTE — ED Provider Notes (Signed)
[facility]-EMERGENCY DEPT Provider Note   CSN: [id number] Arrival date & time: 02/02/16  1204   History   Chief Complaint Chief Complaint  Patient presents with  . Fever  . Elevated WBC    HPI Jonathan [name] is a 76 y.o. male.  HPI level V caveat due to dementia  76 year old male presents today with fever. Patient's wife is at bedside and provides all information. She notes that approximately 10 days ago he developed a fever and elevated white blood cell count in the upper teens. She notes a chest x-ray, urinalysis, and laboratory analysis were performed with slightly elevated creatinine, and elevated WBC. Patient was started on antibiotic Cipro that time. Patient's working diagnosis was presumably a pneumonia as he has had increased cough. She notes that approximately 7 days ago over the weekend he was interacting more, making eye contact, and was afebrile. She notes 4 days ago patient developed return of fever, change in his baseline behavior. Repeat chest x-ray showed no acute findings, patient was started on Levaquin IV last dose last night. Patient's wife notes he has been tolerating by mouth, but does not want to drink. She notes worsening rattling upper respiratory sounds, patient does have some baseline rattles due to COPD. She notes baseline O2 requirement had been bumped up to 3 L from 2 at the onset of symptoms, was noted to be hypoxic in the upper 80s via EMS and bumped 4 L nasal cannula. She notes some erythema to his sacrum, no sores. Patient also started on prednisone at the same time of Levaquin. No diarrhea or vomiting, no changes in urine.  Patient with a history of dementia he is nonverbal, nonambulatory.   Past Medical History:  Diagnosis Date  . Anemia, iron deficiency 06/02/10   heme negative, ferritin 5  . Asthma   . B12 deficiency 06/02/10   174  . Blood transfusion   . CAD (coronary artery disease)    cath 2001- one vessel occluded (circ); others patent.  EF 55%  .  Chronic bronchitis   . Dementia 10/19/2012  . Diabetes mellitus   . Diverticulosis   . ED (erectile dysfunction)   . Emphysema of lung (HCC)   . Gastritis 05/08/2010   EGD  . GERD (gastroesophageal reflux disease)   . H. pylori infection 05/08/2010  . Hiatal hernia 05/08/2010   EGD  . Hyperlipidemia   . Hypertension   . Nephrolithiasis   . Obesity   . Pancreatitis   . Smoker   . Vitamin D deficiency     Patient Active Problem List   Diagnosis Date Noted  . Pneumonia 02/02/2016  . Dementia with behavioral disturbance 10/09/2013  . UTI (lower urinary tract infection) 10/08/2013  . Acute encephalopathy 10/08/2013  . At risk for falling 02/11/2013  . Hypokalemia 02/03/2013  . Hypomagnesemia 02/03/2013  . SOB (shortness of breath) 02/02/2013  . Chest pain 02/02/2013  . Dementia 10/19/2012  . Smoker   . H. pylori infection   . Hiatal hernia   . Gastritis   . Right carotid bruit 03/01/2011  . Emphysema of lung (HCC)   . Blood transfusion   . Asthma, chronic   . DM (diabetes mellitus), secondary, uncontrolled, with peripheral vascular complications (HCC)   . GERD (gastroesophageal reflux disease)   . Hyperlipidemia   . Hypertension   . Nephrolithiasis   . Diverticulosis   . Anemia, iron deficiency 06/03/2010  . B12 deficiency 06/03/2010  . CAD, NATIVE VESSEL 12/18/2009  Past Surgical History:  Procedure Laterality Date  . CARDIAC CATHETERIZATION  01/19/99   showed blockage on"back side"   no stent  . COLONOSCOPY  2005, 06/07/10   mild diverticulosis  . KNEE CARTILAGE SURGERY    . UPPER GASTROINTESTINAL ENDOSCOPY  06/07/10   2 cm hiatal hernia, mild gastritis       Home Medications    Prior to Admission medications   Medication Sig Start Date End Date Taking? Authorizing Provider  albuterol (PROVENTIL) (2.5 MG/3ML) 0.083% nebulizer solution Take 3 mLs by nebulization every 4 (four) hours as needed for shortness of breath or wheezing. 01/24/16  Yes Historical  Provider, MD  amLODipine (NORVASC) 10 MG tablet Take 1 tablet (10 mg total) by mouth daily. Patient taking differently: Take 10 mg by mouth daily with breakfast.  06/07/13  Yes Dorena Bodo, PA-C  aspirin 81 MG chewable tablet Chew 81 mg by mouth daily with breakfast.   Yes Historical Provider, MD  BROVANA 15 MCG/2ML NEBU Take 1 vial by nebulization 2 (two) times daily. 12/11/15  Yes Historical Provider, MD  cyanocobalamin (,VITAMIN B-12,) 1000 MCG/ML injection Inject 1 mL (1,000 mcg total) into the muscle every 30 (thirty) days. Patient taking differently: Inject 1,000 mcg into the muscle every 30 (thirty) days. Takes on the 19th of every month 02/11/13  Yes Dorena Bodo, PA-C  cycloSPORINE (RESTASIS) 0.05 % ophthalmic emulsion Place 1 drop into both eyes 2 (two) times daily.   Yes Historical Provider, MD  donepezil (ARICEPT) 10 MG tablet Take 10 mg by mouth at bedtime.   Yes Historical Provider, MD  ergocalciferol (VITAMIN D2) 50000 units capsule Take 50,000 Units by mouth every 30 (thirty) days. Takes on the 19th of every month   Yes Historical Provider, MD  Eyelid Cleansers (OCUSOFT LID SCRUB) PADS Place 1 each into both eyes 2 (two) times daily.   Yes Historical Provider, MD  Guaifenesin (MUCINEX FOR KIDS) 100 MG PACK Take 600 mg by mouth every 12 (twelve) hours.   Yes Historical Provider, MD  ipratropium-albuterol (DUONEB) 0.5-2.5 (3) MG/3ML SOLN Take 3 mLs by nebulization every 8 (eight) hours.    Yes Historical Provider, MD  levofloxacin (LEVAQUIN) 500 MG/100ML SOLN Inject 500 mg into the vein daily. Started 01/22 for 5 days   Yes Historical Provider, MD  metFORMIN (GLUCOPHAGE) 500 MG tablet Take 500-1,000 mg by mouth 2 (two) times daily. Takes 1000mg  in the morning and 500mg  in the evening   Yes Historical Provider, MD  Multiple Vitamins-Minerals (CERTAVITE/ANTIOXIDANTS PO) Take 1 tablet by mouth daily with breakfast.    Yes Historical Provider, MD  omeprazole (PRILOSEC) 20 MG capsule Take 20 mg  by mouth daily before breakfast.  04/28/15  Yes Historical Provider, MD  OXYGEN Inhale 2 L/min into the lungs continuous.   Yes Historical Provider, MD  predniSONE (DELTASONE) 20 MG tablet Take 40 mg by mouth daily. Started 01/24 for 5 days   Yes Historical Provider, MD  saccharomyces boulardii (FLORASTOR) 250 MG capsule Take 250 mg by mouth daily. Started 01/17 for 28 days   Yes Historical Provider, MD  metFORMIN (GLUCOPHAGE) 1000 MG tablet Take 1 tablet (1,000 mg total) by mouth 2 (two) times daily with a meal. Patient not taking: Reported on 02/02/2016 06/11/13   Dorena Bodo, PA-C    Family History Family History  Problem Relation Age of Onset  . Coronary artery disease Mother     Social History Social History  Substance Use Topics  .  Smoking status: Former Smoker    Types: Cigarettes    Quit date: 11/09/2012  . Smokeless tobacco: Never Used  . Alcohol use No     Allergies   Patient has no known allergies.   Review of Systems Review of Systems  All other systems reviewed and are negative.   Physical Exam Updated Vital Signs BP (!) 102/54 (BP Location: Right Arm)   Pulse 70   Temp 98.9 F (37.2 C) (Axillary)   Resp 20   Ht 5\' 10"  (1.778 m)   Wt 62.6 kg   SpO2 95%   BMI 19.80 kg/m   Physical Exam  Constitutional: He is oriented to person, place, and time. He appears well-developed and well-nourished.  HENT:  Head: Normocephalic and atraumatic.  Eyes: Conjunctivae are normal. Pupils are equal, round, and reactive to light. Right eye exhibits no discharge. Left eye exhibits no discharge. No scleral icterus.  Neck: Normal range of motion. No JVD present. No tracheal deviation present.  Cardiovascular: Regular rhythm.   Pulmonary/Chest: Effort normal. No stridor. No respiratory distress.  UR rales, no crackles ; poor inspiratory effort   Abdominal: Soft. He exhibits no distension. There is no tenderness.  Musculoskeletal: He exhibits no edema.  Neurological: He is  alert and oriented to person, place, and time. Coordination normal.  Skin: Skin is warm.  Sacrum erythematous, no tissue loss  Psychiatric: He has a normal mood and affect. His behavior is normal. Judgment and thought content normal.  Nursing note and vitals reviewed.    ED Treatments / Results  Labs (all labs ordered are listed, but only abnormal results are displayed) Labs Reviewed  CBC WITH DIFFERENTIAL/PLATELET - Abnormal; Notable for the following:       Result Value   WBC 17.0 (*)    Hemoglobin 12.7 (*)    Neutro Abs 14.2 (*)    All other components within normal limits  URINALYSIS, ROUTINE W REFLEX MICROSCOPIC - Abnormal; Notable for the following:    Glucose, UA >=500 (*)    Squamous Epithelial / LPF 0-5 (*)    All other components within normal limits  COMPREHENSIVE METABOLIC PANEL - Abnormal; Notable for the following:    Sodium 154 (*)    Chloride 120 (*)    Glucose, Bld 265 (*)    BUN 38 (*)    Albumin 2.8 (*)    GFR calc non Af Amer 56 (*)    All other components within normal limits  CBC - Abnormal; Notable for the following:    WBC 15.2 (*)    RBC 4.07 (*)    Hemoglobin 11.9 (*)    MCHC 29.7 (*)    All other components within normal limits  I-STAT CG4 LACTIC ACID, ED - Abnormal; Notable for the following:    Lactic Acid, Venous 3.00 (*)    All other components within normal limits  I-STAT CG4 LACTIC ACID, ED - Abnormal; Notable for the following:    Lactic Acid, Venous 2.07 (*)    All other components within normal limits  MRSA PCR SCREENING  CULTURE, BLOOD (ROUTINE X 2)  CULTURE, BLOOD (ROUTINE X 2)  URINE CULTURE  INFLUENZA PANEL BY PCR (TYPE A & B)  SODIUM, URINE, RANDOM  CREATININE, SERUM  OSMOLALITY, URINE  OSMOLALITY  BASIC METABOLIC PANEL  CBC  I-STAT CG4 LACTIC ACID, ED  I-STAT CG4 LACTIC ACID, ED    EKG  EKG Interpretation  Date/Time:  Friday February 02 2016 13:12:19 EST Ventricular Rate:  89 PR Interval:    QRS Duration: 94 QT  Interval:  352 QTC Calculation: 429 R Axis:   -67 Text Interpretation:  Sinus rhythm Left anterior fascicular block Borderline low voltage, extremity leads Abnormal R-wave progression, early transition Artifact Confirmed by Lincoln Brighamees, Liz [phone]([id number]) on 02/02/2016 2:30:45 PM       Radiology Dg Chest 2 View  Result Date: 02/02/2016 CLINICAL DATA:  Elevated white blood cell count EXAM: CHEST  2 VIEW COMPARISON:  02/02/2013 FINDINGS: Cardiac shadow is within normal limits. The lungs are well aerated bilaterally. No focal infiltrate or sizable effusion is seen. No bony abnormality is noted. IMPRESSION: No active cardiopulmonary disease. Electronically Signed   By: Alcide CleverMark  Lukens M.D.   On: 02/02/2016 13:04    Procedures Procedures (including critical care time)  Medications Ordered in ED Medications  vancomycin (VANCOCIN) 500 mg in sodium chloride 0.9 % 100 mL IVPB (not administered)  piperacillin-tazobactam (ZOSYN) IVPB 3.375 g (not administered)  arformoterol (BROVANA) nebulizer solution 15 mcg (15 mcg Nebulization Given 02/02/16 2110)  cycloSPORINE (RESTASIS) 0.05 % ophthalmic emulsion 1 drop (1 drop Both Eyes Given 02/02/16 2126)  OCUSOFT LID SCRUB PADS 1 each (not administered)  aspirin chewable tablet 81 mg (not administered)  albuterol (PROVENTIL) (2.5 MG/3ML) 0.083% nebulizer solution 3 mL (not administered)  predniSONE (DELTASONE) tablet 40 mg (not administered)  pantoprazole (PROTONIX) EC tablet 40 mg (not administered)  ipratropium-albuterol (DUONEB) 0.5-2.5 (3) MG/3ML nebulizer solution 3 mL (3 mLs Nebulization Given 02/02/16 2110)  enoxaparin (LOVENOX) injection 40 mg (40 mg Subcutaneous Given 02/02/16 2126)  0.45 % sodium chloride infusion ( Intravenous New Bag/Given 02/02/16 1857)  insulin aspart (novoLOG) injection 0-9 Units (not administered)  insulin aspart (novoLOG) injection 0-5 Units (not administered)  guaiFENesin (MUCINEX) 12 hr tablet 600 mg (not administered)  sodium chloride  0.9 % bolus 1,000 mL (0 mLs Intravenous Stopped 02/02/16 1406)  sodium chloride 0.9 % bolus 1,000 mL (0 mLs Intravenous Stopped 02/02/16 1453)  vancomycin (VANCOCIN) IVPB 1000 mg/200 mL premix (0 mg Intravenous Stopped 02/02/16 1606)  piperacillin-tazobactam (ZOSYN) IVPB 3.375 g (0 g Intravenous Stopped 02/02/16 1607)  sodium chloride 0.9 % bolus 100 mL (0 mLs Intravenous Stopped 02/02/16 1453)     Initial Impression / Assessment and Plan / ED Course  I have reviewed the triage vital signs and the nursing notes.  Pertinent labs & imaging results that were available during my care of the patient were reviewed by me and considered in my medical decision making (see chart for details).      Final Clinical Impressions(s) / ED Diagnoses   Final diagnoses:  Healthcare-associated pneumonia    Labs: I-STAT lactic acid, CBC, CMP  Imaging: DG chest 2 view  Consults:  Therapeutics:  Discharge Meds:   Assessment/Plan:   76 year old male presents today with sepsis. Patient's presentation is consistent with upper respiratory infection with questionable pneumonia. Patient will be started on hospital-acquired antibiotics, with fluid resuscitation and hospital admission.    New Prescriptions Current Discharge Medication List       Eyvonne MechanicJeffrey Kadir Azucena, PA-C 02/02/16 2127    Tilden FossaElizabeth Rees, MD 02/07/16 [phone]

## 2016-02-03 ENCOUNTER — Inpatient Hospital Stay (HOSPITAL_COMMUNITY): Payer: Medicare Other

## 2016-02-03 DIAGNOSIS — J189 Pneumonia, unspecified organism: Secondary | ICD-10-CM

## 2016-02-03 LAB — BASIC METABOLIC PANEL
ANION GAP: 3 — AB (ref 5–15)
ANION GAP: 6 (ref 5–15)
Anion gap: 4 — ABNORMAL LOW (ref 5–15)
BUN: 33 mg/dL — AB (ref 6–20)
BUN: 31 mg/dL — ABNORMAL HIGH (ref 6–20)
BUN: 32 mg/dL — ABNORMAL HIGH (ref 6–20)
CALCIUM: 8.3 mg/dL — AB (ref 8.9–10.3)
CO2: 26 mmol/L (ref 22–32)
CO2: 25 mmol/L (ref 22–32)
CO2: 25 mmol/L (ref 22–32)
CREATININE: 1.02 mg/dL (ref 0.61–1.24)
Calcium: 8.2 mg/dL — ABNORMAL LOW (ref 8.9–10.3)
Calcium: 8.4 mg/dL — ABNORMAL LOW (ref 8.9–10.3)
Chloride: 122 mmol/L — ABNORMAL HIGH (ref 101–111)
Chloride: 123 mmol/L — ABNORMAL HIGH (ref 101–111)
Chloride: 119 mmol/L — ABNORMAL HIGH (ref 101–111)
Creatinine, Ser: 1.05 mg/dL (ref 0.61–1.24)
Creatinine, Ser: 0.94 mg/dL (ref 0.61–1.24)
Glucose, Bld: 237 mg/dL — ABNORMAL HIGH (ref 65–99)
Glucose, Bld: 265 mg/dL — ABNORMAL HIGH (ref 65–99)
Glucose, Bld: 307 mg/dL — ABNORMAL HIGH (ref 65–99)
POTASSIUM: 4 mmol/L (ref 3.5–5.1)
POTASSIUM: 3.6 mmol/L (ref 3.5–5.1)
Potassium: 3.5 mmol/L (ref 3.5–5.1)
SODIUM: 152 mmol/L — AB (ref 135–145)
SODIUM: 151 mmol/L — AB (ref 135–145)
SODIUM: 150 mmol/L — AB (ref 135–145)

## 2016-02-03 LAB — CBC
HEMATOCRIT: 36.5 % — AB (ref 39.0–52.0)
Hemoglobin: 10.8 g/dL — ABNORMAL LOW (ref 13.0–17.0)
MCH: 29 pg (ref 26.0–34.0)
MCHC: 29.6 g/dL — ABNORMAL LOW (ref 30.0–36.0)
MCV: 97.9 fL (ref 78.0–100.0)
Platelets: 280 10*3/uL (ref 150–400)
RBC: 3.73 MIL/uL — AB (ref 4.22–5.81)
RDW: 15.4 % (ref 11.5–15.5)
WBC: 12.3 10*3/uL — AB (ref 4.0–10.5)

## 2016-02-03 LAB — GLUCOSE, CAPILLARY
GLUCOSE-CAPILLARY: 281 mg/dL — AB (ref 65–99)
GLUCOSE-CAPILLARY: 297 mg/dL — AB (ref 65–99)
Glucose-Capillary: 225 mg/dL — ABNORMAL HIGH (ref 65–99)
Glucose-Capillary: 279 mg/dL — ABNORMAL HIGH (ref 65–99)

## 2016-02-03 MED ORDER — INSULIN ASPART 100 UNIT/ML ~~LOC~~ SOLN
0.0000 [IU] | SUBCUTANEOUS | Status: DC
  Administered 2016-02-03: 8 [IU] via SUBCUTANEOUS
  Administered 2016-02-03: 3 [IU] via SUBCUTANEOUS
  Administered 2016-02-03: 8 [IU] via SUBCUTANEOUS
  Administered 2016-02-04: 11 [IU] via SUBCUTANEOUS
  Administered 2016-02-04: 2 [IU] via SUBCUTANEOUS
  Administered 2016-02-04: 15 [IU] via SUBCUTANEOUS
  Administered 2016-02-04 (×2): 5 [IU] via SUBCUTANEOUS
  Administered 2016-02-05: 2 [IU] via SUBCUTANEOUS
  Administered 2016-02-05: 8 [IU] via SUBCUTANEOUS
  Administered 2016-02-05 (×3): 5 [IU] via SUBCUTANEOUS
  Administered 2016-02-05: 3 [IU] via SUBCUTANEOUS
  Administered 2016-02-05: 5 [IU] via SUBCUTANEOUS
  Administered 2016-02-06: 3 [IU] via SUBCUTANEOUS
  Administered 2016-02-06: 5 [IU] via SUBCUTANEOUS
  Administered 2016-02-06 – 2016-02-07 (×3): 3 [IU] via SUBCUTANEOUS

## 2016-02-03 MED ORDER — DEXTROSE 5 % IV SOLN
INTRAVENOUS | Status: DC
  Administered 2016-02-03 – 2016-02-04 (×2): via INTRAVENOUS

## 2016-02-03 NOTE — Progress Notes (Signed)
PROGRESS NOTE    Jonathan [name]  WJX:[id number] DOB: August 23, 1940 DOA: 02/02/2016 PCP: Colon Branch, MD   Brief Narrative: Jonathan [name] is a 76 y.o. male with a history of COPD and dementia. Patient presented to the emergency department with confusion. Workup for possible infection underwent since patient presented with sepsis.   Assessment & Plan:   Active Problems:   Pneumonia  Sepsis Resolved after IV boluses and initiation of antibiotics. Unknown source. ?aspiration pneumonia vs urinary infection. Cultures pending. -continue Vancomycin/Zosyn -follow-up urine culture/blood culture  Acute on chronic respiratory failure with hypoxia Patient has baseline COPD.  -treat possible pneumonia  COPD -continue Brovana, Duonebs and albuterol prn -continue prednisone burst (1/24>>  Hypernatremia Sodium slightly improved from 154 to 152. On 1/2NS fluids -switch to D5w -watch blood sugar  Diabetes mellitus Blood sugars elevated. Worsened by steroids -continue SSI -continue to hold home metformin  GERD -Continue PPI  Advanced dementia Patient has waxing and waning altered mental status at baseline. Wife thinks it has possibly lasted longer than usual. Respiratory effort normal. -continue Aricept -watch mental status  Essential hypertension -continue to hold amlodipine   DVT prophylaxis: Lovenox Code Status: DNR/DNI Family Communication: Wife at bedside Disposition Plan: Anticipate discharge back to SNF in 2-3 days   Consultants:   Palliative care  Procedures:  None  Antimicrobials:   Vancomycin (1/26>>  Zosyn (1/26>>    Subjective: Patient not responding to questions. Somnolent.  Objective: Vitals:   02/03/16 0524 02/03/16 0530 02/03/16 1411 02/03/16 1511  BP:  130/66 132/66   Pulse:  72 71   Resp:  18 18   Temp:  98.1 F (36.7 C) 98 F (36.7 C)   TempSrc:  Axillary Oral   SpO2: 92% 93% 94% 92%  Weight:      Height:        Intake/Output  Summary (Last 24 hours) at 02/03/16 1530 Last data filed at 02/03/16 1412  Gross per 24 hour  Intake            988.7 ml  Output              850 ml  Net            138.7 ml   Filed Weights   02/02/16 1219 02/02/16 1801  Weight: 68.9 kg (152 lb) 62.6 kg (138 lb)    Examination:  General exam: Appears calm and comfortable. Sleeping Respiratory system: diffuse upper respiratory sounds. Respiratory effort normal. Cardiovascular system: S1 & S2 heard, RRR. [name] murmurs Gastrointestinal system: Abdomen is nondistended, soft and nontender. [name] organomegaly or masses felt. Normal bowel sounds heard. Central nervous system: Somnolent Extremities: [name] edema. [name] calf tenderness Skin: [name] cyanosis. [name] rashes    Data Reviewed: I have personally reviewed following labs and imaging studies  CBC:  Recent Labs Lab 02/02/16 1312 02/02/16 1959 02/03/16 0415  WBC 17.0* 15.2* 12.3*  NEUTROABS 14.2*  --   --   HGB 12.7* 11.9* 10.8*  HCT 42.0 40.1 36.5*  MCV 96.3 98.5 97.9  PLT 315 283 280   Basic Metabolic Panel:  Recent Labs Lab 02/02/16 1312 02/02/16 1959 02/03/16 0415 02/03/16 1322  NA 154*  --  152* 151*  K 3.7  --  3.5 4.0  CL 120*  --  122* 123*  CO2 26  --  26 25  GLUCOSE 265*  --  237* 265*  BUN 38*  --  33* 31*  CREATININE 1.23 1.08 1.02 1.05  CALCIUM  8.9  --  8.3* 8.2*   GFR: Estimated Creatinine Clearance: 54.7 mL/min (by C-G formula based on SCr of 1.05 mg/dL). Liver Function Tests:  Recent Labs Lab 02/02/16 1312  AST 17  ALT 18  ALKPHOS 95  BILITOT 0.3  PROT 6.5  ALBUMIN 2.8*   [name] results for input(s): LIPASE, AMYLASE in the last 168 hours. [name] results for input(s): AMMONIA in the last 168 hours. Coagulation Profile: [name] results for input(s): INR, PROTIME in the last 168 hours. Cardiac Enzymes: [name] results for input(s): CKTOTAL, CKMB, CKMBINDEX, TROPONINI in the last 168 hours. BNP (last 3 results) [name] results for input(s): PROBNP in the last 8760  hours. HbA1C: [name] results for input(s): HGBA1C in the last 72 hours. CBG:  Recent Labs Lab 02/02/16 2125 02/03/16 0753  GLUCAP 261* 225*   Lipid Profile: [name] results for input(s): CHOL, HDL, LDLCALC, TRIG, CHOLHDL, LDLDIRECT in the last 72 hours. Thyroid Function Tests: [name] results for input(s): TSH, T4TOTAL, FREET4, T3FREE, THYROIDAB in the last 72 hours. Anemia Panel: [name] results for input(s): VITAMINB12, FOLATE, FERRITIN, TIBC, IRON, RETICCTPCT in the last 72 hours. Sepsis Labs:  Recent Labs Lab 02/02/16 1327 02/02/16 1640  LATICACIDVEN 3.00* 2.07*    Recent Results (from the past 240 hour(s))  Blood Culture (routine x 2)     Status: None (Preliminary result)   Collection Time: 02/02/16  1:54 PM  Result Value Ref Range Status   Specimen Description BLOOD BLOOD LEFT FOREARM  Final   Special Requests BOTTLES DRAWN AEROBIC AND ANAEROBIC 5 CC  Final   Culture   Final    [name] GROWTH < 24 HOURS Performed at [facility], [address] [place], [place] [address]    Report Status PENDING  Incomplete  Blood Culture (routine x 2)     Status: None (Preliminary result)   Collection Time: 02/02/16  2:05 PM  Result Value Ref Range Status   Specimen Description BLOOD BLOOD RIGHT HAND  Final   Special Requests BOTTLES DRAWN AEROBIC AND ANAEROBIC 5 CC  Final   Culture   Final    [name] GROWTH < 24 HOURS Performed at [facility], [address] [place], [place] [address]    Report Status PENDING  Incomplete  MRSA PCR Screening     Status: None   Collection Time: 02/02/16  6:20 PM  Result Value Ref Range Status   MRSA by PCR NEGATIVE NEGATIVE Final    Comment:        The GeneXpert MRSA Assay (FDA approved for NASAL specimens only), is one component of a comprehensive MRSA colonization surveillance program. It is not intended to diagnose MRSA infection nor to guide or monitor treatment for MRSA infections.          Radiology Studies: Dg Chest 2 View  Result  Date: 02/02/2016 CLINICAL DATA:  Elevated white blood cell count EXAM: CHEST  2 VIEW COMPARISON:  02/02/2013 FINDINGS: Cardiac shadow is within normal limits. The lungs are well aerated bilaterally. [name] focal infiltrate or sizable effusion is seen. [name] bony abnormality is noted. IMPRESSION: [name] active cardiopulmonary disease. Electronically Signed   By: Alcide CleverMark  Lukens M.D.   On: 02/02/2016 13:04   Dg Swallowing Func-speech Pathology  Result Date: 02/03/2016 Objective Swallowing Evaluation: Type of Study: MBS-Modified Barium Swallow Study Patient Details Name: Jonathan [name] MRN: [id number] Date of Birth: 01/13/1940 Today's Date: 02/03/2016 Time: SLP Start Time (ACUTE ONLY): 1015-SLP Stop Time (ACUTE ONLY): 1030 SLP Time Calculation (  min) (ACUTE ONLY): 15 min Past Medical History: Past Medical History: Diagnosis Date . Anemia, iron deficiency 06/02/10  heme negative, ferritin 5 . Asthma  . B12 deficiency 06/02/10  174 . Blood transfusion  . CAD (coronary artery disease)   cath 2001- one vessel occluded (circ); others patent.  EF 55% . Chronic bronchitis  . Dementia 10/19/2012 . Diabetes mellitus  . Diverticulosis  . ED (erectile dysfunction)  . Emphysema of lung (HCC)  . Gastritis 05/08/2010  EGD . GERD (gastroesophageal reflux disease)  . H. pylori infection 05/08/2010 . Hiatal hernia 05/08/2010  EGD . Hyperlipidemia  . Hypertension  . Nephrolithiasis  . Obesity  . Pancreatitis  . Smoker  . Vitamin D deficiency  Past Surgical History: Past Surgical History: Procedure Laterality Date . CARDIAC CATHETERIZATION  01/19/99  showed blockage on"back side"   [name] stent . COLONOSCOPY  2005, 06/07/10  mild diverticulosis . KNEE CARTILAGE SURGERY   . UPPER GASTROINTESTINAL ENDOSCOPY  06/07/10  2 cm hiatal hernia, mild gastritis HPI: Jonathan [name] a 75 y.o.malewith a history of COPD and advanced dementia, GERD, hiatal hernia,  who presents from SNF for fever and cough. Dx with PNA with primary concern for aspiration PNA due to recurrent  fever in the absence of cutaneous or urinary source. CXR negative.  [name] Data Recorded Assessment / Plan / Recommendation CHL IP CLINICAL IMPRESSIONS 02/03/2016 Therapy Diagnosis Moderate pharyngeal phase dysphagia;Moderate oral phase dysphagia Clinical Impression Patient presents with moderate oropharyngeal dysphagia, likely exacerbated by lethargy. Oral phase delayed, requiring max tactile cues for oral transit of bolus. Swallow eventually initiated, leaving moderate oral and moderate-severe pharyngeal residuals post swallow due to base of tongue and laryngeal weakness. Attempted empty spoon presentation to elicit dry swallow without success. Trace silent penetration of nectar thick and thin liquids noted but with decreased residuals. Note that oral awareness and transit of bolus more impaired than during bedside testing this am due to increased fatigue. Suspect that swallowing function waxes and wanes with mentation at baseline. Discussed results with spouse. Decision made to continue current diet with SLP f/u. Wife does not wish for feeding tube placement and understands aspiration risk. If mentation improves, would consider repeat MBS to establish baseline prior to d/c back to SNF.  Impact on safety and function Severe aspiration risk   CHL IP TREATMENT RECOMMENDATION 02/03/2016 Treatment Recommendations Therapy as outlined in treatment plan below   Prognosis 02/03/2016 Prognosis for Safe Diet Advancement Guarded Barriers to Reach Goals Cognitive deficits;Time post onset Barriers/Prognosis Comment -- CHL IP DIET RECOMMENDATION 02/03/2016 SLP Diet Recommendations Dysphagia 1 (Puree) solids;Nectar thick liquid Liquid Administration via Spoon Medication Administration Crushed with puree Compensations Slow rate;Small sips/bites Postural Changes Seated upright at 90 degrees   CHL IP OTHER RECOMMENDATIONS 02/03/2016 Recommended Consults -- Oral Care Recommendations Oral care BID Other Recommendations Order thickener from  pharmacy;Prohibited food (jello, ice cream, thin soups);Remove water pitcher   CHL IP FOLLOW UP RECOMMENDATIONS 02/03/2016 Follow up Recommendations Skilled Nursing facility   [facility] IP FREQUENCY AND DURATION 02/03/2016 Speech Therapy Frequency (ACUTE ONLY) min 2x/week Treatment Duration 2 weeks      CHL IP ORAL PHASE 02/03/2016 Oral Phase Impaired Oral - Pudding Teaspoon -- Oral - Pudding Cup -- Oral - Honey Teaspoon Left anterior bolus loss;Impaired mastication;Weak lingual manipulation;Incomplete tongue to palate contact;Reduced posterior propulsion;Holding of bolus;Right pocketing in lateral sulci;Left pocketing in lateral sulci;Pocketing in anterior sulcus;Lingual/palatal residue;Delayed oral transit;Decreased bolus cohesion Oral - Honey Cup -- Oral - Nectar Teaspoon Left anterior  bolus loss;Impaired mastication;Weak lingual manipulation;Incomplete tongue to palate contact;Reduced posterior propulsion;Holding of bolus;Right pocketing in lateral sulci;Left pocketing in lateral sulci;Pocketing in anterior sulcus;Lingual/palatal residue;Delayed oral transit;Decreased bolus cohesion Oral - Nectar Cup -- Oral - Nectar Straw -- Oral - Thin Teaspoon Left anterior bolus loss;Impaired mastication;Weak lingual manipulation;Incomplete tongue to palate contact;Reduced posterior propulsion;Holding of bolus;Right pocketing in lateral sulci;Left pocketing in lateral sulci;Pocketing in anterior sulcus;Lingual/palatal residue;Delayed oral transit;Decreased bolus cohesion Oral - Thin Cup -- Oral - Thin Straw -- Oral - Puree Impaired mastication;Weak lingual manipulation;Incomplete tongue to palate contact;Reduced posterior propulsion;Holding of bolus;Right pocketing in lateral sulci;Left pocketing in lateral sulci;Pocketing in anterior sulcus;Lingual/palatal residue;Delayed oral transit;Decreased bolus cohesion Oral - Mech Soft -- Oral - Regular -- Oral - Multi-Consistency -- Oral - Pill -- Oral Phase - Comment --  CHL IP PHARYNGEAL  PHASE 02/03/2016 Pharyngeal Phase Impaired Pharyngeal- Pudding Teaspoon -- Pharyngeal -- Pharyngeal- Pudding Cup -- Pharyngeal -- Pharyngeal- Honey Teaspoon Delayed swallow initiation-vallecula;Pharyngeal residue - valleculae;Pharyngeal residue - pyriform;Reduced anterior laryngeal mobility;Reduced laryngeal elevation;Reduced tongue base retraction;Pharyngeal residue - posterior pharnyx Pharyngeal -- Pharyngeal- Honey Cup -- Pharyngeal -- Pharyngeal- Nectar Teaspoon Delayed swallow initiation-vallecula;Pharyngeal residue - valleculae;Pharyngeal residue - pyriform;Reduced anterior laryngeal mobility;Reduced laryngeal elevation;Reduced tongue base retraction;Pharyngeal residue - posterior pharnyx Pharyngeal Material enters airway, remains ABOVE vocal cords and not ejected out Pharyngeal- Nectar Cup -- Pharyngeal -- Pharyngeal- Nectar Straw -- Pharyngeal -- Pharyngeal- Thin Teaspoon Delayed swallow initiation-vallecula;Pharyngeal residue - valleculae;Pharyngeal residue - pyriform;Reduced anterior laryngeal mobility;Reduced laryngeal elevation;Reduced tongue base retraction;Pharyngeal residue - posterior pharnyx;Penetration/Aspiration during swallow Pharyngeal Material enters airway, remains ABOVE vocal cords and not ejected out Pharyngeal- Thin Cup -- Pharyngeal -- Pharyngeal- Thin Straw -- Pharyngeal -- Pharyngeal- Puree Delayed swallow initiation-vallecula;Pharyngeal residue - valleculae;Pharyngeal residue - pyriform;Reduced anterior laryngeal mobility;Reduced laryngeal elevation;Reduced tongue base retraction;Pharyngeal residue - posterior pharnyx Pharyngeal -- Pharyngeal- Mechanical Soft -- Pharyngeal -- Pharyngeal- Regular -- Pharyngeal -- Pharyngeal- Multi-consistency -- Pharyngeal -- Pharyngeal- Pill -- Pharyngeal -- Pharyngeal Comment --  Ferdinand Lango MA, CCC-SLP [phone] McCoy Leah Meryl 02/03/2016, 11:24 AM                   Scheduled Meds: . arformoterol  15 mcg Nebulization BID  . aspirin  81 mg  Oral Q breakfast  . cycloSPORINE  1 drop Both Eyes BID  . enoxaparin (LOVENOX) injection  40 mg Subcutaneous Q24H  . insulin aspart  0-15 Units Subcutaneous Q4H  . ipratropium-albuterol  3 mL Nebulization Q8H  . OCUSOFT LID SCRUB  1 each Both Eyes BID  . pantoprazole  40 mg Oral Daily  . piperacillin-tazobactam (ZOSYN)  IV  3.375 g Intravenous Q8H  . predniSONE  40 mg Oral Q breakfast  . vancomycin  500 mg Intravenous Q12H   Continuous Infusions: . dextrose 75 mL/hr at 02/03/16 0904     LOS: 1 day     Jacquelin Hawking [facility] 02/03/2016, 3:30 PM Pager: [phone]  If 7PM-7AM, please contact night-coverage [email or web address] Password [email or web address] 02/03/2016, 3:30 PM

## 2016-02-03 NOTE — Progress Notes (Signed)
Modified Barium Swallow Progress Note  Patient Details  Name: Jonathan DemarkDanny P Wubben MRN: [id number] Date of Birth: 09-04-1940  Today's Date: 02/03/2016  Modified Barium Swallow completed.  Full report located under Chart Review in the Imaging Section.  Brief recommendations include the following:  Clinical Impression  Patient presents with moderate oropharyngeal dysphagia, likely exacerbated by lethargy. Oral phase delayed, requiring max tactile cues for oral transit of bolus. Swallow eventually initiated, leaving moderate oral and moderate-severe pharyngeal residuals post swallow due to base of tongue and laryngeal weakness. Attempted empty spoon presentation to elicit dry swallow without success. Trace silent penetration of nectar thick and thin liquids noted but with decreased residuals. Note that oral awareness and transit of bolus more impaired than during bedside testing this am due to increased fatigue. Suspect that swallowing function waxes and wanes with mentation at baseline. Discussed results with spouse. Decision made to continue current diet with SLP f/u. Wife does not wish for feeding tube placement and understands aspiration risk. If mentation improves, would consider repeat MBS to establish baseline prior to d/c back to SNF.    Swallow Evaluation Recommendations       SLP Diet Recommendations: Dysphagia 1 (Puree) solids;Nectar thick liquid   Liquid Administration via: Spoon   Medication Administration: Crushed with puree   Supervision: Full assist for feeding;Full supervision/cueing for compensatory strategies   Compensations: Slow rate;Small sips/bites   Postural Changes: Seated upright at 90 degrees   Oral Care Recommendations: Oral care BID   Other Recommendations: Order thickener from pharmacy;Prohibited food (jello, ice cream, thin soups);Remove water pitcher   Vicenta Olds MA, CCC-SLP [phone]  Gaddiel Cullens Meryl 02/03/2016,11:24 AM

## 2016-02-03 NOTE — Evaluation (Signed)
Clinical/Bedside Swallow Evaluation Patient Details  Name: Jonathan [name] MRN: [id number] Date of Birth: 22-Dec-1940  Today's Date: 02/03/2016 Time: SLP Start Time (ACUTE ONLY): 0900 SLP Stop Time (ACUTE ONLY): 0917 SLP Time Calculation (min) (ACUTE ONLY): 17 min  Past Medical History:  Past Medical History:  Diagnosis Date  . Anemia, iron deficiency 06/02/10   heme negative, ferritin 5  . Asthma   . B12 deficiency 06/02/10   174  . Blood transfusion   . CAD (coronary artery disease)    cath 2001- one vessel occluded (circ); others patent.  EF 55%  . Chronic bronchitis   . Dementia 10/19/2012  . Diabetes mellitus   . Diverticulosis   . ED (erectile dysfunction)   . Emphysema of lung (HCC)   . Gastritis 05/08/2010   EGD  . GERD (gastroesophageal reflux disease)   . H. pylori infection 05/08/2010  . Hiatal hernia 05/08/2010   EGD  . Hyperlipidemia   . Hypertension   . Nephrolithiasis   . Obesity   . Pancreatitis   . Smoker   . Vitamin D deficiency    Past Surgical History:  Past Surgical History:  Procedure Laterality Date  . CARDIAC CATHETERIZATION  01/19/99   showed blockage on"back side"   no stent  . COLONOSCOPY  2005, 06/07/10   mild diverticulosis  . KNEE CARTILAGE SURGERY    . UPPER GASTROINTESTINAL ENDOSCOPY  06/07/10   2 cm hiatal hernia, mild gastritis   HPI:  Jonathan P Reddickis a 75 y.o.malewith a history of COPD and advanced dementia, GERD, hiatal hernia,  who presents from SNF for fever and cough. Dx with PNA with primary concern for aspiration PNA due to recurrent fever in the absence of cutaneous or urinary source. CXR negative.    Assessment / Plan / Recommendation Clinical Impression  Patient presents with a likely cognitively based oropharyngeal dysphagia due to advanced dementia. Wife present and supportive, providing history for patient who is non-verbal. Per wife, patient placed on dysphagia 1, nectar thick liquids by  SLP at SNF after MBS complete  which did not show aspiration however patient continued to present with s/s of aspiration at bedside with thin liquids. Per wife, current function close to baseline. Eyes remained closed, patient on-verbal, does not follow directions, however consistently opens oral cavity with tactile touch of bolus to lips, orally transits bolus and initiates a pharyngeal swallow. Patient with decreased labial seal, sluggish appearing hyo-laryngeal movement, and occassional multiple swallows to suggest dysphagia. No overt s/s of aspiration however cannot r/o silent aspiration in the setting of advanced dementia with decreased awareness and sensation. Discussed options with wife including continuation of current diet with and without instrumental testing. Wife wishes for repeat MBS to aid in determining least restrictive diet to mitigate aspiration risk. Made clear that she does not wish to alternative means of nutrition. Will proceed with MBS today.     Aspiration Risk  Moderate aspiration risk    Diet Recommendation Dysphagia 1 (Puree);Nectar-thick liquid   Liquid Administration via: Cup;No straw Medication Administration: Crushed with puree Supervision: Staff to assist with self feeding;Full supervision/cueing for compensatory strategies Compensations: Slow rate;Small sips/bites Postural Changes: Seated upright at 90 degrees    Other  Recommendations Oral Care Recommendations: Oral care BID Other Recommendations: Order thickener from pharmacy;Prohibited food (jello, ice cream, thin soups);Remove water pitcher   Follow up Recommendations Skilled Nursing facility        Swallow Study   General HPI: Jonathan Mesa Reddickis a  76 y.o.malewith a history of COPD and advanced dementia, GERD, hiatal hernia,  who presents from SNF for fever and cough. Dx with PNA with primary concern for aspiration PNA due to recurrent fever in the absence of cutaneous or urinary source. CXR negative.  Type of Study: Bedside Swallow  Evaluation Previous Swallow Assessment: none noted Diet Prior to this Study: Dysphagia 1 (puree);Nectar-thick liquids Temperature Spikes Noted: Yes Respiratory Status: Nasal cannula History of Recent Intubation: No Behavior/Cognition: Lethargic/Drowsy;Requires cueing;Doesn't follow directions Oral Cavity Assessment: Dry Oral Care Completed by SLP: Yes Oral Cavity - Dentition: Poor condition;Missing dentition Self-Feeding Abilities: Total assist Patient Positioning: Upright in bed Baseline Vocal Quality: Not observed (non-verbal) Volitional Cough: Cognitively unable to elicit Volitional Swallow: Unable to elicit    Oral/Motor/Sensory Function Overall Oral Motor/Sensory Function: Generalized oral weakness (unable to formally assess due to mental status)   Ice Chips Ice chips: Not tested   Thin Liquid Thin Liquid: Not tested    Nectar Thick Nectar Thick Liquid: Not tested   Honey Thick Honey Thick Liquid: Not tested   Puree Puree: Impaired Presentation: Spoon Oral Phase Impairments: Reduced labial seal;Reduced lingual movement/coordination Oral Phase Functional Implications: Prolonged oral transit Pharyngeal Phase Impairments: Decreased hyoid-laryngeal movement;Suspected delayed Swallow   Solid   GO  Jonathan Stigler MA, CCC-SLP [phone]  Solid: Not tested        Jonathan [name] 02/03/2016,9:24 AM

## 2016-02-03 NOTE — Consult Note (Signed)
Consultation Note Date: 02/03/2016   Patient Name: Jonathan DemarkDanny P Miracle  DOB: 12/17/1940  MRN: [id number]  Age / Sex: 76 y.o., male  PCP: Colon BranchAyyaz Qureshi, MD Referring Physician: Narda Bondsalph A Nettey, MD  Reason for Consultation: Establishing goals of care  HPI/Patient Profile: 76 y.o. male  with past medical history of COPD, advanced dementia admitted on 02/02/2016 with sepsis, possible aspiration PNA.   Clinical Assessment and Goals of Care:  Primary historian is the patient's wife Britta MccreedyBarbara who is present at the bedside, and history also obtained from chart review.   The patient is a 76 yo gentleman, [facility] now, he has advanced dementia. He has been bed bound since October 2017. 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 Patient has had fever cough for past 2 weeks at his SNF, he was given antibiotics. He did not get better. He is now admitted with sepsis, asp pna, he is on Abx, speech therapy is also following him. A palliative consult has been placed for additional GOC discussions.   The patient's wife is at his bedside, I introduced myself and palliative care as follows: Palliative medicine is specialized medical care for people living with serious illness. It focuses on providing relief from the symptoms and stress of a serious illness. The goal is to improve quality of life for both the patient and the family.  Patient's wife is familiar with our team, her father died on the palliative care unit at [facility] several years ago.   We discussed about dementia trajectory, wife is well aware. She does not want PEG, she acknowledges that the patient's quality of life has been poor for the past several months, essentially, since he has become bed bound. She visits him regularly, she desires treatment for pna, no heroic measures. We discussed about  addition of hospice as an extra layer of support at his SNF in the next few weeks to months, based on patient's disease trajectory, code status is DNR DNI.  See recommendations below, thank you for the consult.   HCPOA  wife, has two children, son and daughter who live locally.   SUMMARY OF RECOMMENDATIONS   Discussed with wife and agree with DNR DNI, no PEG,  MBS study today to confirm safest PO diet possible.  Also discussed about following patient's disease trajectory at his SNF and [facility] as an extra layer of support, should the patient have recurrent aspirations or infections in the near future. She is agreeable Continue current mode of care for now  Code Status/Advance Care Planning:  DNR    Symptom Management:    continue current measures  Palliative Prophylaxis:   Bowel Regimen   Psycho-social/Spiritual:   Desire for further Chaplaincy support:no  Additional Recommendations: Education on Hospice  Prognosis:   < 12 months  [facility]charge [facility], monitor over there for addition of hospice services.  Primary Diagnoses: Present on Admission: . Pneumonia   I have reviewed the medical record, interviewed the patient and family, and examined the patient. The following aspects are pertinent.  Past Medical History:  Diagnosis Date  . Anemia, iron deficiency 06/02/10   heme negative, ferritin 5  . Asthma   . B12 deficiency 06/02/10   174  . Blood transfusion   . CAD (coronary artery disease)    cath 2001- one vessel occluded (circ); others patent.  EF 55%  . Chronic bronchitis   . Dementia 10/19/2012  . Diabetes mellitus   . Diverticulosis   . ED (erectile dysfunction)   . Emphysema of lung (HCC)   . Gastritis 05/08/2010   EGD  . GERD (gastroesophageal reflux disease)   . H. pylori infection 05/08/2010  . Hiatal hernia 05/08/2010   EGD  . Hyperlipidemia   . Hypertension   . Nephrolithiasis   . Obesity     . Pancreatitis   . Smoker   . Vitamin D deficiency    Social History   Social History  . Marital status: Married    Spouse name: N/A  . Number of children: N/A  . Years of education: N/A   Social History Main Topics  . Smoking status: Former Smoker    Types: Cigarettes    Quit date: 11/09/2012  . Smokeless tobacco: Never Used  . Alcohol use No  . Drug use: No  . Sexual activity: No   Other Topics Concern  . None   Social History Narrative  . None   Family History  Problem Relation Age of Onset  . Coronary artery disease Mother    Scheduled Meds: . arformoterol  15 mcg Nebulization BID  . aspirin  81 mg Oral Q breakfast  . cycloSPORINE  1 drop Both Eyes BID  . enoxaparin (LOVENOX) injection  40 mg Subcutaneous Q24H  . insulin aspart  0-15 Units Subcutaneous Q4H  . ipratropium-albuterol  3 mL Nebulization Q8H  . OCUSOFT LID SCRUB  1 each Both Eyes BID  . pantoprazole  40 mg Oral Daily  . piperacillin-tazobactam (ZOSYN)  IV  3.375 g Intravenous Q8H  . predniSONE  40 mg Oral Q breakfast  . vancomycin  500 mg Intravenous Q12H   Continuous Infusions: . dextrose 75 mL/hr at 02/03/16 0904   PRN Meds:.albuterol, guaiFENesin Medications Prior to Admission:  Prior to Admission medications   Medication Sig Start Date End Date Taking? Authorizing Provider  albuterol (PROVENTIL) (2.5 MG/3ML) 0.083% nebulizer solution Take 3 mLs by nebulization every 4 (four) hours as needed for shortness of breath or wheezing. 01/24/16  Yes Historical Provider, MD  amLODipine (NORVASC) 10 MG tablet Take 1 tablet (10 mg total) by mouth daily. Patient taking differently: Take 10 mg by mouth daily with breakfast.  06/07/13  Yes Dorena Bodo, PA-C  aspirin 81 MG chewable tablet Chew 81 mg by mouth daily with breakfast.   Yes Historical Provider, MD  BROVANA 15 MCG/2ML NEBU Take 1 vial by nebulization 2 (two) times daily. 12/11/15  Yes Historical Provider, MD  cyanocobalamin (,VITAMIN B-12,) 1000  MCG/ML injection Inject 1 mL (1,000 mcg total) into the muscle every 30 (thirty) days. Patient taking differently: Inject 1,000 mcg into the muscle every 30 (thirty) days. Takes on the 19th of every month 02/11/13  Yes Dorena Bodo, PA-C  cycloSPORINE (RESTASIS) 0.05 % ophthalmic emulsion Place 1 drop into both eyes 2 (two) times daily.   Yes Historical Provider, MD  donepezil (ARICEPT) 10 MG tablet Take 10 mg by mouth at bedtime.   Yes Historical Provider, MD  ergocalciferol (VITAMIN D2) 50000 units capsule Take 50,000 Units by mouth every 30 (thirty) days. Takes on the 19th of every month   Yes Historical Provider, MD  Eyelid Cleansers (OCUSOFT LID SCRUB) PADS Place 1 each into both eyes 2 (two) times daily.   Yes Historical Provider, MD  Guaifenesin (MUCINEX FOR KIDS) 100 MG PACK Take 600 mg by mouth every 12 (twelve) hours.   Yes Historical Provider, MD  ipratropium-albuterol (DUONEB) 0.5-2.5 (3) MG/3ML SOLN Take 3 mLs by nebulization every 8 (eight) hours.    Yes Historical Provider, MD  levofloxacin (LEVAQUIN) 500 MG/100ML SOLN Inject 500 mg into the vein daily. Started 01/22 for 5 days   Yes Historical Provider, MD  metFORMIN (GLUCOPHAGE) 500 MG tablet Take 500-1,000 mg by mouth 2 (two) times daily. Takes 1000mg  in the morning and 500mg  in the evening   Yes Historical Provider, MD  Multiple Vitamins-Minerals (CERTAVITE/ANTIOXIDANTS PO) Take 1 tablet by mouth daily with breakfast.    Yes Historical Provider, MD  omeprazole (PRILOSEC) 20 MG capsule Take 20 mg by mouth daily before breakfast.  04/28/15  Yes Historical Provider, MD  OXYGEN Inhale 2 L/min into the lungs continuous.   Yes Historical Provider, MD  predniSONE (DELTASONE) 20 MG tablet Take 40 mg by mouth daily. Started 01/24 for 5 days   Yes Historical Provider, MD  saccharomyces boulardii (FLORASTOR) 250 MG capsule Take 250 mg by mouth daily. Started 01/17 for 28 days   Yes Historical Provider, MD  metFORMIN (GLUCOPHAGE) 1000 MG tablet  Take 1 tablet (1,000 mg total) by mouth 2 (two) times daily with a meal. Patient not taking: Reported on 02/02/2016 06/11/13   Dorena Bodo, PA-C   No Known Allergies Review of Systems Non verbal  Physical Exam Elderly gentleman NAD S1 S2 Coarse breath sounds anteriorly Abdomen soft No edema  Vital Signs: BP 130/66 (BP Location: Right Arm)   Pulse 72   Temp 98.1 F (36.7 C) (Axillary)   Resp 18   Ht 5\' 10"  (1.778 m)   Wt 62.6 kg (138 lb)   SpO2 93%   BMI 19.80 kg/m  Pain Assessment: PAINAD       SpO2: SpO2: 93 % O2 Device:SpO2: 93 % O2 Flow Rate: .O2 Flow Rate (L/min): 3 L/min  IO: Intake/output summary:  Intake/Output Summary (Last 24 hours) at 02/03/16 1117 Last data filed at 02/03/16 0500  Gross per 24 hour  Intake            938.7 ml  Output              550 ml  Net            388.7 ml    LBM: Last BM Date:  (pta) Baseline Weight: Weight: 68.9 kg (152 lb) Most recent weight: Weight: 62.6 kg (138 lb)     Palliative Assessment/Data:   Flowsheet Rows   Flowsheet Row Most Recent Value  Intake Tab  Referral Department  Hospitalist  Unit at Time of Referral  Med/Surg Unit  Palliative Care Primary Diagnosis  Neurology  Palliative Care Type  New Palliative care  Reason for referral  Clarify Goals of Care, Counsel Regarding Hospice  Date first seen by Palliative Care  02/03/16  Clinical Assessment  Palliative Performance Scale Score  30%  Pain Max last 24 hours  4  Pain Min Last 24 hours  3  Dyspnea Max Last 24 Hours  5  Dyspnea Min Last 24 hours  4  Nausea Max Last 24 Hours  3  Nausea Min Last 24 Hours  2  Anxiety Max Last 24 Hours  3  Anxiety Min Last 24 Hours  2  Psychosocial & Spiritual Assessment  Palliative Care Outcomes  Patient/Family meeting held?  Yes  Who was at the meeting?  wife   Palliative Care Outcomes  Clarified goals of care, Counseled regarding hospice      Time In: 10.20 Time Out: 11.20 Time Total: 60 Greater than 50%  of this  time was spent counseling and coordinating care related to the above assessment and plan.  Signed by: Rosalin Hawking, MD  [phone]  Please contact Palliative Medicine Team phone at [phone] for questions and concerns.  For individual provider: See Loretha Stapler

## 2016-02-04 ENCOUNTER — Inpatient Hospital Stay (HOSPITAL_COMMUNITY): Payer: Medicare Other

## 2016-02-04 DIAGNOSIS — E870 Hyperosmolality and hypernatremia: Secondary | ICD-10-CM

## 2016-02-04 DIAGNOSIS — R400 Somnolence: Secondary | ICD-10-CM

## 2016-02-04 DIAGNOSIS — K219 Gastro-esophageal reflux disease without esophagitis: Secondary | ICD-10-CM

## 2016-02-04 DIAGNOSIS — E1365 Other specified diabetes mellitus with hyperglycemia: Secondary | ICD-10-CM

## 2016-02-04 DIAGNOSIS — I639 Cerebral infarction, unspecified: Secondary | ICD-10-CM

## 2016-02-04 DIAGNOSIS — R4182 Altered mental status, unspecified: Secondary | ICD-10-CM

## 2016-02-04 DIAGNOSIS — E1351 Other specified diabetes mellitus with diabetic peripheral angiopathy without gangrene: Secondary | ICD-10-CM

## 2016-02-04 LAB — BASIC METABOLIC PANEL
ANION GAP: 7 (ref 5–15)
ANION GAP: 6 (ref 5–15)
Anion gap: 5 (ref 5–15)
BUN: 30 mg/dL — AB (ref 6–20)
BUN: 29 mg/dL — ABNORMAL HIGH (ref 6–20)
BUN: 30 mg/dL — ABNORMAL HIGH (ref 6–20)
CHLORIDE: 119 mmol/L — AB (ref 101–111)
CHLORIDE: 119 mmol/L — AB (ref 101–111)
CHLORIDE: 121 mmol/L — AB (ref 101–111)
CO2: 25 mmol/L (ref 22–32)
CO2: 26 mmol/L (ref 22–32)
CO2: 24 mmol/L (ref 22–32)
Calcium: 8.3 mg/dL — ABNORMAL LOW (ref 8.9–10.3)
Calcium: 8.3 mg/dL — ABNORMAL LOW (ref 8.9–10.3)
Calcium: 8.5 mg/dL — ABNORMAL LOW (ref 8.9–10.3)
Creatinine, Ser: 0.95 mg/dL (ref 0.61–1.24)
Creatinine, Ser: 0.98 mg/dL (ref 0.61–1.24)
Creatinine, Ser: 1.23 mg/dL (ref 0.61–1.24)
GFR calc Af Amer: >60 mL/min (ref >60)
GFR calc Af Amer: >60 mL/min (ref >60)
GFR calc Af Amer: >60 mL/min (ref >60)
GFR calc non Af Amer: >60 mL/min (ref >60)
GFR, EST NON AFRICAN AMERICAN: 56 mL/min — AB (ref >60)
GLUCOSE: 235 mg/dL — AB (ref 65–99)
GLUCOSE: 156 mg/dL — AB (ref 65–99)
GLUCOSE: 282 mg/dL — AB (ref 65–99)
POTASSIUM: 3 mmol/L — AB (ref 3.5–5.1)
POTASSIUM: 3 mmol/L — AB (ref 3.5–5.1)
POTASSIUM: 4.1 mmol/L (ref 3.5–5.1)
Sodium: 149 mmol/L — ABNORMAL HIGH (ref 135–145)
Sodium: 152 mmol/L — ABNORMAL HIGH (ref 135–145)
Sodium: 151 mmol/L — ABNORMAL HIGH (ref 135–145)

## 2016-02-04 LAB — BLOOD GAS, ARTERIAL
Acid-Base Excess: 1.3 mmol/L (ref 0.0–2.0)
BICARBONATE: 25 mmol/L (ref 20.0–28.0)
O2 CONTENT: 3.5 L/min
O2 Saturation: 91.7 %
PATIENT TEMPERATURE: 98.6
PH ART: 7.431 (ref 7.350–7.450)
pCO2 arterial: 38.3 mmHg (ref 32.0–48.0)
pO2, Arterial: 62.8 mmHg — ABNORMAL LOW (ref 83.0–108.0)

## 2016-02-04 LAB — GLUCOSE, CAPILLARY
GLUCOSE-CAPILLARY: 78 mg/dL (ref 65–99)
GLUCOSE-CAPILLARY: 217 mg/dL — AB (ref 65–99)
GLUCOSE-CAPILLARY: 375 mg/dL — AB (ref 65–99)
Glucose-Capillary: 220 mg/dL — ABNORMAL HIGH (ref 65–99)
Glucose-Capillary: 140 mg/dL — ABNORMAL HIGH (ref 65–99)
Glucose-Capillary: 318 mg/dL — ABNORMAL HIGH (ref 65–99)

## 2016-02-04 LAB — URINE CULTURE: Culture: NO GROWTH

## 2016-02-04 LAB — TSH: TSH: 0.278 u[IU]/mL — ABNORMAL LOW (ref 0.350–4.500)

## 2016-02-04 LAB — AMMONIA: Ammonia: 58 umol/L — ABNORMAL HIGH (ref 9–35)

## 2016-02-04 MED ORDER — POTASSIUM CHLORIDE CRYS ER 20 MEQ PO TBCR
40.0000 meq | EXTENDED_RELEASE_TABLET | ORAL | Status: AC
  Administered 2016-02-04 (×2): 40 meq via ORAL
  Filled 2016-02-04 (×2): qty 2

## 2016-02-04 NOTE — Progress Notes (Signed)
[facility] to 5c16 [facility]. Non-verbal. Tongue dry, mouth care given. Condum cath on. Wife just arrived.

## 2016-02-04 NOTE — Consult Note (Signed)
Neurology Consult Note  Reason for Consultation: Stroke on CT head  Requesting provider: Cordelia Poche, MD  CC: Patient is nonverbal due to dementia so this cannot be obtained  HPI: This is a 34-yo man who is transferred from [facility] for evaluation of a stroke that was incidentally discovered on a CT of the head obtained during his admission. History is obtained from the review of the patient's medical record as well as d/w his wife who is present at the bedside.   He was admitted to [facility] on 02/02/16 after presenting from his nursing facility with fever and cough. He had been febrile for about 10 days PTA. He was evaluated and treated for presumed PNA with IV ciprofloxacin with reported improvement. His fever returned and he would not swallow. He developed an increased oxygen requirement with leukocytosis. He was started on steroids and abx but symptoms continued to worsen and EMS was called. He was found to be hypoxic with SpO2 in the 80s. He was transported to the ED where he was placed on the sepsis protocol. He was also found to have hyponatremia with an initial sodium 154. Because of persistently worsening mental status, CT of the head was ordered along with ammonia, TSH, and EEG. CT of the head ultimately showed a subacute left occipital infarction with some hemorrhagic conversion. This has prompted transfer the patient to [facility] for neurologic evaluation by the stroke service.  While he was at [facility], he was seen in consultation by palliative care. His wife met with the palliative care provider and they discussed the expected trajectory of his dementia. His wife indicated that she would not want PEG tube placement and acknowledge that the patient's quality of life has been poor for the past several months. She wants him to be treated for pneumonia but does not desire heroic measures. The patient is DO NOT RESUSCITATE/DO NOT INTUBATE.  Regarding his dementia, the patient's wife  reports that he has been diagnosed with vascular dementia and has been progressively worsening over the past 10 years. More substantial changes of occurred over the past 12 months. He has been nonverbal since January 2017. She states that he has been bedbound since October 2017. She typically goes to his nursing home to feed himself breakfast and dinner and states that she is not sure that he recognizes who she has any longer.  PMH:  Past Medical History:  Diagnosis Date  . Anemia, iron deficiency 06/02/10   heme negative, ferritin 5  . Asthma   . B12 deficiency 06/02/10   174  . Blood transfusion   . CAD (coronary artery disease)    cath 2001- one vessel occluded (circ); others patent.  EF 55%  . Chronic bronchitis   . Dementia 10/19/2012  . Diabetes mellitus   . Diverticulosis   . ED (erectile dysfunction)   . Emphysema of lung ([place])   . Gastritis 05/08/2010   EGD  . GERD (gastroesophageal reflux disease)   . H. pylori infection 05/08/2010  . Hiatal hernia 05/08/2010   EGD  . Hyperlipidemia   . Hypertension   . Nephrolithiasis   . Obesity   . Pancreatitis   . Smoker   . Vitamin D deficiency     PSH:  Past Surgical History:  Procedure Laterality Date  . CARDIAC CATHETERIZATION  01/19/99   showed blockage on"back side"   no stent  . COLONOSCOPY  2005, 06/07/10   mild diverticulosis  . KNEE CARTILAGE SURGERY    .  UPPER GASTROINTESTINAL ENDOSCOPY  06/07/10   2 cm hiatal hernia, mild gastritis    Family history: Family History  Problem Relation Age of Onset  . Coronary artery disease Mother     Social history:  Social History   Social History  . Marital status: Married    Spouse name: N/A  . Number of children: N/A  . Years of education: N/A   Occupational History  . Not on file.   Social History Main Topics  . Smoking status: Former Smoker    Types: Cigarettes    Quit date: 11/09/2012  . Smokeless tobacco: Never Used  . Alcohol use No  . Drug use: No  . Sexual  activity: No   Other Topics Concern  . Not on file   Social History Narrative  . No narrative on file    Current outpatient meds: Current Meds  Medication Sig  . albuterol (PROVENTIL) (2.5 MG/3ML) 0.083% nebulizer solution Take 3 mLs by nebulization every 4 (four) hours as needed for shortness of breath or wheezing.  Marland Kitchen amLODipine (NORVASC) 10 MG tablet Take 1 tablet (10 mg total) by mouth daily. (Patient taking differently: Take 10 mg by mouth daily with breakfast. )  . aspirin 81 MG chewable tablet Chew 81 mg by mouth daily with breakfast.  . BROVANA 15 MCG/2ML NEBU Take 1 vial by nebulization 2 (two) times daily.  . cyanocobalamin (,VITAMIN B-12,) 1000 MCG/ML injection Inject 1 mL (1,000 mcg total) into the muscle every 30 (thirty) days. (Patient taking differently: Inject 1,000 mcg into the muscle every 30 (thirty) days. Takes on the 19th of every month)  . cycloSPORINE (RESTASIS) 0.05 % ophthalmic emulsion Place 1 drop into both eyes 2 (two) times daily.  Marland Kitchen donepezil (ARICEPT) 10 MG tablet Take 10 mg by mouth at bedtime.  . ergocalciferol (VITAMIN D2) 50000 units capsule Take 50,000 Units by mouth every 30 (thirty) days. Takes on the 19th of every month  . Eyelid Cleansers (OCUSOFT LID SCRUB) PADS Place 1 each into both eyes 2 (two) times daily.  . Guaifenesin (MUCINEX FOR KIDS) 100 MG PACK Take 600 mg by mouth every 12 (twelve) hours.  Marland Kitchen ipratropium-albuterol (DUONEB) 0.5-2.5 (3) MG/3ML SOLN Take 3 mLs by nebulization every 8 (eight) hours.   Marland Kitchen levofloxacin (LEVAQUIN) 500 MG/100ML SOLN Inject 500 mg into the vein daily. Started 01/22 for 5 days  . metFORMIN (GLUCOPHAGE) 500 MG tablet Take 500-1,000 mg by mouth 2 (two) times daily. Takes 1040m in the morning and 5042min the evening  . Multiple Vitamins-Minerals (CERTAVITE/ANTIOXIDANTS PO) Take 1 tablet by mouth daily with breakfast.   . omeprazole (PRILOSEC) 20 MG capsule Take 20 mg by mouth daily before breakfast.   . OXYGEN Inhale  2 L/min into the lungs continuous.  . predniSONE (DELTASONE) 20 MG tablet Take 40 mg by mouth daily. Started 01/24 for 5 days  . saccharomyces boulardii (FLORASTOR) 250 MG capsule Take 250 mg by mouth daily. Started 01/17 for 28 days    Current inpatient meds:  Current Facility-Administered Medications  Medication Dose Route Frequency Provider Last Rate Last Dose  . albuterol (PROVENTIL) (2.5 MG/3ML) 0.083% nebulizer solution 3 mL  3 mL Nebulization Q4H PRN RyPatrecia PourMD      . arformoterol ([facility] LLCnebulizer solution 15 mcg  15 mcg Nebulization BID RyPatrecia PourMD   15 mcg at 02/04/16 0943  . aspirin chewable tablet 81 mg  81 mg Oral Q breakfast RyPatrecia PourMD  81 mg at 02/04/16 0855  . cycloSPORINE (RESTASIS) 0.05 % ophthalmic emulsion 1 drop  1 drop Both Eyes BID Patrecia Pour, MD   1 drop at 02/04/16 0854  . dextrose 5 % solution   Intravenous Continuous Mariel Aloe, MD 100 mL/hr at 02/04/16 1700    . enoxaparin (LOVENOX) injection 40 mg  40 mg Subcutaneous Q24H Patrecia Pour, MD   40 mg at 02/03/16 2214  . guaiFENesin (MUCINEX) 12 hr tablet 600 mg  600 mg Oral BID PRN Patrecia Pour, MD      . insulin aspart (novoLOG) injection 0-15 Units  0-15 Units Subcutaneous Q4H Mariel Aloe, MD   15 Units at 02/04/16 1614  . ipratropium-albuterol (DUONEB) 0.5-2.5 (3) MG/3ML nebulizer solution 3 mL  3 mL Nebulization Q8H Patrecia Pour, MD   3 mL at 02/04/16 1354  . OCUSOFT LID SCRUB PADS 1 each  1 each Both Eyes BID Patrecia Pour, MD      . pantoprazole (PROTONIX) EC tablet 40 mg  40 mg Oral Daily Patrecia Pour, MD   40 mg at 02/04/16 0855  . piperacillin-tazobactam (ZOSYN) IVPB 3.375 g  3.375 g Intravenous Q8H Quintella Reichert, MD   3.375 g at 02/04/16 0854    Allergies: No Known Allergies  ROS: As per HPI. A full 14-point review of systems cannot be obtained as the patient is nonverbal.   PE:  BP 111/65 (BP Location: Right Arm)   Pulse 70   Temp 99 F (37.2 C) (Axillary)   Resp 18    Ht _0  (1.778 m)   Wt 62.6 kg (138 lb)   SpO2 98%   BMI 19.80 kg/m   General: Chronically ill-appearing elderly male lying in hospital bed with head extended, looking up and to the right--his wife says that this is a typical posture for him. He is nonverbal. He will look at me on either side during the exam. He does not follow any commands.  HEENT: Normocephalic. He resists passive neck movement. His mouth hangs partly open and mucous membranes are dry. Sclerae anicteric. Mild conjunctival injection.  CV: Regular, no murmur. Carotid pulses full and symmetric, no bruits. Distal pulses 2+ and symmetric.  Lungs: Coarse breath sounds noted on anterior exam bilaterally.  Abdomen: Soft, non-distended, non-tender. Bowel sounds present x4.  Extremities: No C/C/E. Neuro:  CN: Pupils are equal and round. They are symmetrically reactive from 3-->2 mm. He inconsistently blink to visual threat. His eyes are conjugate. He will look towards me when I move from side to side with grossly intact horizontal saccades. No nystagmus is appreciated. He grimaces to supraorbital pressure bilaterally. Corneals are intact. His face appears to be largely symmetric at rest and he has a symmetric grimace. There is cranial nerves cannot be accurately assessed as he does not participate with the evaluation.  Motor: Reduced muscle bulk throughout. He has gegenhalten paratonia. No significant spontaneous movement is observed. No tremor or other abnormal movements.  Sensation: He grimaces and withdraws from light noxious stimuli 4. DTRs: Brisk 2+, symmetric. Toes upgoing bilaterally. He has positive palmomentals bilaterally, positive grasps bilaterally, positive glabellar, positive snout, and positive suck reflexes..  Coordination and gait: These cannot be assessed as the patient is unable to participate with the examination.   Labs:  Lab Results  Component Value Date   WBC 12.3 (H) 02/03/2016   HGB 10.8 (L) 02/03/2016    HCT 36.5 (L) 02/03/2016   PLT 280  02/03/2016   GLUCOSE 282 (H) 02/04/2016   CHOL 116 04/19/2013   TRIG 84 04/19/2013   HDL 46 04/19/2013   LDLCALC 53 04/19/2013   ALT 18 02/02/2016   AST 17 02/02/2016   NA 151 (H) 02/04/2016   K 4.1 02/04/2016   CL 121 (H) 02/04/2016   CREATININE 1.23 02/04/2016   BUN 30 (H) 02/04/2016   CO2 24 02/04/2016   TSH 0.278 (L) 02/04/2016   PSA 0.82 06/08/2012   INR 0.97 10/08/2013   HGBA1C 6.4 (H) 10/08/2013    Imaging:  I have personally and independently reviewed CT scan of the head without contrast from today. This shows a focal area of hypodensity involving the left temporal occipital region that is most compatible with subacute infarction. This is heterogeneous and appearance, suggesting the possibility of petechial hemorrhage versus spared brain tissue within the body of the infarct. Moderate to severe diffuse generalized atrophy is present. He has severe chronic small vessel disease involving the bihemispheric white matter.  Assessment and Plan:  1. Ischemic Stroke: This is appears to be a subacute stroke involving the L PCA territory. Known risk factors for cerebrovascular disease in this patient include coronary artery disease, diabetes, hypertension, hyperlipidemia, history of tobacco abuse, vascular dementia, and age. Because of his severe dementia, further workup is likely not going to be beneficial. He is not a candidate for any types of intervention due to his poor premorbid functional status in the setting of advanced dementia. Continue aspirin 81 mg daily for secondary stroke prevention. Ensure adequate glucose control. There is no role for permissive hypertension at this stage so blood pressure can be controlled as needed. Aggressively treat fever and hyperglycemia as these are associated with worse neurological outcomes following stroke. DVT prophylaxis as needed.   2. Acute encephalopathy: This is most consistent with a delirium superimposed  upon severe background dementia. Likely contributors include pneumonia, sepsis, hyponatremia, mild uremia, subacute stroke. His underlying dementia places him at increased risk for encephalopathy from all causes. It may also predispose to incomplete recovery from encephalopathy with a new functional baseline once recovered. Treatment is supportive. Continue to optimize metabolic status and treat infection as you are. Avoid CNS active medications, particularly benzodiazepines, opiates, and anything with strong anticholinergic properties. Every attempt should be made to regulate circadian rhythms, keeping his room bright and active by day and quiet by night. If agitation becomes an issue and is unresponsive to nonpharmacologic intervention, consider low dose atypical anti-psychotic as needed.  3. Dementia: His wife reports he has a long-standing history of vascular dementia. This is extremely advanced and he is presently nonverbal and bed-bound as a result of his disease. Appreciate palliative care involvement. Prognosis is poor and I agree that he would likely benefit from involvement of hospice at this stage.  This was discussed with the patient's wife at the bedside. She is in agreement with the plan as noted. She was given the opportunity to ask any questions and these are addressed to her satisfaction. This consultation. The stroke team will assume care of the patient beginning 02/05/16.

## 2016-02-04 NOTE — Progress Notes (Addendum)
PROGRESS NOTE    [name]  EXB:[id number] DOB: 1940-03-15 DOA: 02/02/2016 PCP: Colon Branch, MD   Brief Narrative: Jonathan [name] is a 76 y.o. male with a history of COPD and dementia. Patient presented to the emergency department with confusion. Workup for possible infection underwent since patient presented with sepsis.   Assessment & Plan:   Active Problems:   Pneumonia  Sepsis Resolved after IV boluses and initiation of antibiotics. Unknown source. ?aspiration pneumonia vs urinary infection. Cultures pending. -continue Vancomycin/Zosyn -follow-up urine culture/blood culture  Acute on chronic respiratory failure with hypoxia Patient has baseline COPD.  -treat possible pneumonia  COPD -continue Brovana, Duonebs and albuterol prn -continue prednisone burst (1/24>>  Hypernatremia Sodium slightly worsened this morning. Secondary to inanition from dementia -increase rate of  D5w to 142ml/hr -watch blood sugar  Diabetes mellitus Blood sugars elevated. Worsened by steroids -continue SSI -continue to hold home metformin  GERD -Continue PPI  Advanced dementia Patient has waxing and waning altered mental status at baseline. Wife thinks it has possibly lasted longer than usual. Respiratory effort normal. -continue Aricept  Altered mental status Not improved per wife. Discussed goals of care. Will proceed with non-invasive workup. If negative, likely secondary to worsening dementia. -CT head -ammonia, TSH -ABG -EEG  Essential hypertension -continue to hold amlodipine   Addendum 2:37PM  Subacute stroke This may or may not explain altered mental status. Stroke is in the left posterior artery territory involving posterior temporal and occipital lobe. Discussed with neurology who requests transfer to [facility]. Discussed with family in setting of dementia and functional status. Explained benefits and risks to transfer and management of stroke. Family has  decided they would in fact, want intervention. Patient to be transferred to [facility] for further management.    DVT prophylaxis: Lovenox Code Status: DNR/DNI Family Communication: Wife at bedside Disposition Plan: Anticipate discharge back to SNF in 1-2 days pending AMS workup   Consultants:   Palliative care  Procedures:  None  Antimicrobials:   Vancomycin (1/26>>  Zosyn (1/26>>    Subjective: Patient not responding to questions. Somnolent still today.  Objective: Vitals:   02/03/16 2039 02/03/16 2127 02/04/16 0444 02/04/16 0943  BP: 117/60  117/67   Pulse: 85  86   Resp: 18  18   Temp: 99.1 F (37.3 C)  97.8 F (36.6 C)   TempSrc: Oral  Oral   SpO2: 93% 90% 92% 91%  Weight:      Height:        Intake/Output Summary (Last 24 hours) at 02/04/16 1116 Last data filed at 02/04/16 0701  Gross per 24 hour  Intake           1472.5 ml  Output              550 ml  Net            922.5 ml   Filed Weights   02/02/16 1219 02/02/16 1801  Weight: 68.9 kg (152 lb) 62.6 kg (138 lb)    Examination:  General exam: Appears calm and comfortable. Sleeping Respiratory system: diffuse transmitted upper respiratory sounds. Respiratory effort normal. Cardiovascular system: S1 & S2 heard, RRR. No murmurs Gastrointestinal system: Abdomen is nondistended, soft and nontender. Normal bowel sounds heard. Central nervous system: Obtunded Extremities: No edema. No calf tenderness Skin: No cyanosis. No rashes    Data Reviewed: I have personally reviewed following labs and imaging studies  CBC:  Recent Labs Lab 02/02/16 1312 02/02/16  1959 02/03/16 0415  WBC 17.0* 15.2* 12.3*  NEUTROABS 14.2*  --   --   HGB 12.7* 11.9* 10.8*  HCT 42.0 40.1 36.5*  MCV 96.3 98.5 97.9  PLT 315 283 280   Basic Metabolic Panel:  Recent Labs Lab 02/03/16 0415 02/03/16 1322 02/03/16 2112 02/04/16 0137 02/04/16 0725  NA 152* 151* 150* 149* 152*  K 3.5 4.0 3.6 3.0* 3.0*  CL 122*  123* 119* 119* 119*  CO2 26 25 25 25 26   GLUCOSE 237* 265* 307* 235* 156*  BUN 33* 31* 32* 30* 29*  CREATININE 1.02 1.05 0.94 0.95 0.98  CALCIUM 8.3* 8.2* 8.4* 8.3* 8.3*   GFR: Estimated Creatinine Clearance: 58.6 mL/min (by C-G formula based on SCr of 0.98 mg/dL). Liver Function Tests:  Recent Labs Lab 02/02/16 1312  AST 17  ALT 18  ALKPHOS 95  BILITOT 0.3  PROT 6.5  ALBUMIN 2.8*   No results for input(s): LIPASE, AMYLASE in the last 168 hours. No results for input(s): AMMONIA in the last 168 hours. Coagulation Profile: No results for input(s): INR, PROTIME in the last 168 hours. Cardiac Enzymes: No results for input(s): CKTOTAL, CKMB, CKMBINDEX, TROPONINI in the last 168 hours. BNP (last 3 results) No results for input(s): PROBNP in the last 8760 hours. HbA1C: No results for input(s): HGBA1C in the last 72 hours. CBG:  Recent Labs Lab 02/03/16 2031 02/03/16 2303 02/04/16 0102 02/04/16 0436 02/04/16 0737  GLUCAP 297* 279* 220* 78 140*   Lipid Profile: No results for input(s): CHOL, HDL, LDLCALC, TRIG, CHOLHDL, LDLDIRECT in the last 72 hours. Thyroid Function Tests: No results for input(s): TSH, T4TOTAL, FREET4, T3FREE, THYROIDAB in the last 72 hours. Anemia Panel: No results for input(s): VITAMINB12, FOLATE, FERRITIN, TIBC, IRON, RETICCTPCT in the last 72 hours. Sepsis Labs:  Recent Labs Lab 02/02/16 1327 02/02/16 1640  LATICACIDVEN 3.00* 2.07*    Recent Results (from the past 240 hour(s))  Urine culture     Status: None   Collection Time: 02/02/16 12:40 PM  Result Value Ref Range Status   Specimen Description URINE, CLEAN CATCH  Final   Special Requests NONE  Final   Culture   Final    NO GROWTH Performed at [facility], [address], [place], [place] [address]    Report Status 02/04/2016 FINAL  Final  Blood Culture (routine x 2)     Status: None (Preliminary result)   Collection Time: 02/02/16  1:54 PM  Result Value Ref Range Status    Specimen Description BLOOD BLOOD LEFT FOREARM  Final   Special Requests BOTTLES DRAWN AEROBIC AND ANAEROBIC 5 CC  Final   Culture   Final    NO GROWTH < 24 HOURS Performed at [facility], [address], [place], [place] [address]    Report Status PENDING  Incomplete  Blood Culture (routine x 2)     Status: None (Preliminary result)   Collection Time: 02/02/16  2:05 PM  Result Value Ref Range Status   Specimen Description BLOOD BLOOD RIGHT HAND  Final   Special Requests BOTTLES DRAWN AEROBIC AND ANAEROBIC 5 CC  Final   Culture   Final    NO GROWTH < 24 HOURS Performed at [facility], [address] [place], [place] [address]    Report Status PENDING  Incomplete  MRSA PCR Screening     Status: None   Collection Time: 02/02/16  6:20 PM  Result Value Ref Range Status  MRSA by PCR NEGATIVE NEGATIVE Final    Comment:        The GeneXpert MRSA Assay (FDA approved for NASAL specimens only), is one component of a comprehensive MRSA colonization surveillance program. It is not intended to diagnose MRSA infection nor to guide or monitor treatment for MRSA infections.          Radiology Studies: Dg Chest 2 View  Result Date: 02/02/2016 CLINICAL DATA:  Elevated white blood cell count EXAM: CHEST  2 VIEW COMPARISON:  02/02/2013 FINDINGS: Cardiac shadow is within normal limits. The lungs are well aerated bilaterally. No focal infiltrate or sizable effusion is seen. No bony abnormality is noted. IMPRESSION: No active cardiopulmonary disease. Electronically Signed   By: Alcide Clever M.D.   On: 02/02/2016 13:04   Dg Swallowing Func-speech Pathology  Result Date: 02/03/2016 Objective Swallowing Evaluation: Type of Study: MBS-Modified Barium Swallow Study Patient Details Name: MOSIE ANGUS MRN: [id number] Date of Birth: 06-26-1940 Today's Date: 02/03/2016 Time: SLP Start Time (ACUTE ONLY): 1015-SLP Stop Time (ACUTE ONLY): 1030 SLP Time Calculation (min) (ACUTE ONLY): 15 min  Past Medical History: Past Medical History: Diagnosis Date . Anemia, iron deficiency 06/02/10  heme negative, ferritin 5 . Asthma  . B12 deficiency 06/02/10  174 . Blood transfusion  . CAD (coronary artery disease)   cath 2001- one vessel occluded (circ); others patent.  EF 55% . Chronic bronchitis  . Dementia 10/19/2012 . Diabetes mellitus  . Diverticulosis  . ED (erectile dysfunction)  . Emphysema of lung (HCC)  . Gastritis 05/08/2010  EGD . GERD (gastroesophageal reflux disease)  . H. pylori infection 05/08/2010 . Hiatal hernia 05/08/2010  EGD . Hyperlipidemia  . Hypertension  . Nephrolithiasis  . Obesity  . Pancreatitis  . Smoker  . Vitamin D deficiency  Past Surgical History: Past Surgical History: Procedure Laterality Date . CARDIAC CATHETERIZATION  01/19/99  showed blockage on"back side"   no stent . COLONOSCOPY  2005, 06/07/10  mild diverticulosis . KNEE CARTILAGE SURGERY   . UPPER GASTROINTESTINAL ENDOSCOPY  06/07/10  2 cm hiatal hernia, mild gastritis HPI: Jonathan P Reddickis a 75 y.o.malewith a history of COPD and advanced dementia, GERD, hiatal hernia,  who presents from SNF for fever and cough. Dx with PNA with primary concern for aspiration PNA due to recurrent fever in the absence of cutaneous or urinary source. CXR negative.  No Data Recorded Assessment / Plan / Recommendation CHL IP CLINICAL IMPRESSIONS 02/03/2016 Therapy Diagnosis Moderate pharyngeal phase dysphagia;Moderate oral phase dysphagia Clinical Impression Patient presents with moderate oropharyngeal dysphagia, likely exacerbated by lethargy. Oral phase delayed, requiring max tactile cues for oral transit of bolus. Swallow eventually initiated, leaving moderate oral and moderate-severe pharyngeal residuals post swallow due to base of tongue and laryngeal weakness. Attempted empty spoon presentation to elicit dry swallow without success. Trace silent penetration of nectar thick and thin liquids noted but with decreased residuals. Note that oral  awareness and transit of bolus more impaired than during bedside testing this am due to increased fatigue. Suspect that swallowing function waxes and wanes with mentation at baseline. Discussed results with spouse. Decision made to continue current diet with SLP f/u. Wife does not wish for feeding tube placement and understands aspiration risk. If mentation improves, would consider repeat MBS to establish baseline prior to d/c back to SNF.  Impact on safety and function Severe aspiration risk   CHL IP TREATMENT RECOMMENDATION 02/03/2016 Treatment Recommendations Therapy as outlined in treatment plan below   Prognosis  02/03/2016 Prognosis for Safe Diet Advancement Guarded Barriers to Reach Goals Cognitive deficits;Time post onset Barriers/Prognosis Comment -- CHL IP DIET RECOMMENDATION 02/03/2016 SLP Diet Recommendations Dysphagia 1 (Puree) solids;Nectar thick liquid Liquid Administration via Spoon Medication Administration Crushed with puree Compensations Slow rate;Small sips/bites Postural Changes Seated upright at 90 degrees   CHL IP OTHER RECOMMENDATIONS 02/03/2016 Recommended Consults -- Oral Care Recommendations Oral care BID Other Recommendations Order thickener from pharmacy;Prohibited food (jello, ice cream, thin soups);Remove water pitcher   CHL IP FOLLOW UP RECOMMENDATIONS 02/03/2016 Follow up Recommendations Skilled Nursing facility   [facility] IP FREQUENCY AND DURATION 02/03/2016 Speech Therapy Frequency (ACUTE ONLY) min 2x/week Treatment Duration 2 weeks      CHL IP ORAL PHASE 02/03/2016 Oral Phase Impaired Oral - Pudding Teaspoon -- Oral - Pudding Cup -- Oral - Honey Teaspoon Left anterior bolus loss;Impaired mastication;Weak lingual manipulation;Incomplete tongue to palate contact;Reduced posterior propulsion;Holding of bolus;Right pocketing in lateral sulci;Left pocketing in lateral sulci;Pocketing in anterior sulcus;Lingual/palatal residue;Delayed oral transit;Decreased bolus cohesion Oral - Honey Cup -- Oral -  Nectar Teaspoon Left anterior bolus loss;Impaired mastication;Weak lingual manipulation;Incomplete tongue to palate contact;Reduced posterior propulsion;Holding of bolus;Right pocketing in lateral sulci;Left pocketing in lateral sulci;Pocketing in anterior sulcus;Lingual/palatal residue;Delayed oral transit;Decreased bolus cohesion Oral - Nectar Cup -- Oral - Nectar Straw -- Oral - Thin Teaspoon Left anterior bolus loss;Impaired mastication;Weak lingual manipulation;Incomplete tongue to palate contact;Reduced posterior propulsion;Holding of bolus;Right pocketing in lateral sulci;Left pocketing in lateral sulci;Pocketing in anterior sulcus;Lingual/palatal residue;Delayed oral transit;Decreased bolus cohesion Oral - Thin Cup -- Oral - Thin Straw -- Oral - Puree Impaired mastication;Weak lingual manipulation;Incomplete tongue to palate contact;Reduced posterior propulsion;Holding of bolus;Right pocketing in lateral sulci;Left pocketing in lateral sulci;Pocketing in anterior sulcus;Lingual/palatal residue;Delayed oral transit;Decreased bolus cohesion Oral - Mech Soft -- Oral - Regular -- Oral - Multi-Consistency -- Oral - Pill -- Oral Phase - Comment --  CHL IP PHARYNGEAL PHASE 02/03/2016 Pharyngeal Phase Impaired Pharyngeal- Pudding Teaspoon -- Pharyngeal -- Pharyngeal- Pudding Cup -- Pharyngeal -- Pharyngeal- Honey Teaspoon Delayed swallow initiation-vallecula;Pharyngeal residue - valleculae;Pharyngeal residue - pyriform;Reduced anterior laryngeal mobility;Reduced laryngeal elevation;Reduced tongue base retraction;Pharyngeal residue - posterior pharnyx Pharyngeal -- Pharyngeal- Honey Cup -- Pharyngeal -- Pharyngeal- Nectar Teaspoon Delayed swallow initiation-vallecula;Pharyngeal residue - valleculae;Pharyngeal residue - pyriform;Reduced anterior laryngeal mobility;Reduced laryngeal elevation;Reduced tongue base retraction;Pharyngeal residue - posterior pharnyx Pharyngeal Material enters airway, remains ABOVE vocal  cords and not ejected out Pharyngeal- Nectar Cup -- Pharyngeal -- Pharyngeal- Nectar Straw -- Pharyngeal -- Pharyngeal- Thin Teaspoon Delayed swallow initiation-vallecula;Pharyngeal residue - valleculae;Pharyngeal residue - pyriform;Reduced anterior laryngeal mobility;Reduced laryngeal elevation;Reduced tongue base retraction;Pharyngeal residue - posterior pharnyx;Penetration/Aspiration during swallow Pharyngeal Material enters airway, remains ABOVE vocal cords and not ejected out Pharyngeal- Thin Cup -- Pharyngeal -- Pharyngeal- Thin Straw -- Pharyngeal -- Pharyngeal- Puree Delayed swallow initiation-vallecula;Pharyngeal residue - valleculae;Pharyngeal residue - pyriform;Reduced anterior laryngeal mobility;Reduced laryngeal elevation;Reduced tongue base retraction;Pharyngeal residue - posterior pharnyx Pharyngeal -- Pharyngeal- Mechanical Soft -- Pharyngeal -- Pharyngeal- Regular -- Pharyngeal -- Pharyngeal- Multi-consistency -- Pharyngeal -- Pharyngeal- Pill -- Pharyngeal -- Pharyngeal Comment --  Ferdinand Lango MA, CCC-SLP [phone] McCoy Leah Meryl 02/03/2016, 11:24 AM                   Scheduled Meds: . arformoterol  15 mcg Nebulization BID  . aspirin  81 mg Oral Q breakfast  . cycloSPORINE  1 drop Both Eyes BID  . enoxaparin (LOVENOX) injection  40 mg Subcutaneous Q24H  . insulin aspart  0-15 Units Subcutaneous Q4H  .  ipratropium-albuterol  3 mL Nebulization Q8H  . OCUSOFT LID SCRUB  1 each Both Eyes BID  . pantoprazole  40 mg Oral Daily  . piperacillin-tazobactam (ZOSYN)  IV  3.375 g Intravenous Q8H  . potassium chloride  40 mEq Oral Q4H   Continuous Infusions: . dextrose 75 mL/hr at 02/03/16 0904     LOS: 2 days     Jacquelin HawkingRalph Dealva Lafoy [facility] 02/04/2016, 11:16 AM Pager: [phone]  If 7PM-7AM, please contact night-coverage [email or web address] Password [email or web address] 02/04/2016, 11:16 AM

## 2016-02-04 NOTE — Progress Notes (Signed)
Patient to transfer to [place] neuro floor per MD order. Bed assigned and report called to receiving RN.  Pt to be transported via [facility].

## 2016-02-04 NOTE — Progress Notes (Signed)
Patient seen this am, spoke with wife at bedside.   Patient is a little more alert, eyes open, wife thinks he is getting closer to baseline Does not appear to be in distress Has eaten 40% of his pureed diet with complete assistance from wife BP 117/67 (BP Location: Right Arm)   Pulse 86   Temp 97.8 F (36.6 C) (Oral)   Resp 18   Ht 5\' 10"  (1.778 m)   Wt 62.6 kg (138 lb)   SpO2 91%   BMI 19.80 kg/m  Labs noted: blood cx NGTD, influenza negative.  NAD Shallow clear breath sounds anteriorly S1 S2 Abdomen soft No edema  Agree with current treatment, continue to monitor for symptoms.  [facility], [facility] following the patient over there.   15 min spent Rosalin HawkingZeba Kyasia Steuck MD [facility] [phone]

## 2016-02-05 ENCOUNTER — Inpatient Hospital Stay (HOSPITAL_COMMUNITY): Payer: Medicare Other

## 2016-02-05 DIAGNOSIS — G9340 Encephalopathy, unspecified: Secondary | ICD-10-CM

## 2016-02-05 DIAGNOSIS — J189 Pneumonia, unspecified organism: Secondary | ICD-10-CM

## 2016-02-05 DIAGNOSIS — I6330 Cerebral infarction due to thrombosis of unspecified cerebral artery: Secondary | ICD-10-CM | POA: Insufficient documentation

## 2016-02-05 LAB — GLUCOSE, CAPILLARY
GLUCOSE-CAPILLARY: 171 mg/dL — AB (ref 65–99)
GLUCOSE-CAPILLARY: 228 mg/dL — AB (ref 65–99)
GLUCOSE-CAPILLARY: 277 mg/dL — AB (ref 65–99)
Glucose-Capillary: 134 mg/dL — ABNORMAL HIGH (ref 65–99)
Glucose-Capillary: 178 mg/dL — ABNORMAL HIGH (ref 65–99)
Glucose-Capillary: 214 mg/dL — ABNORMAL HIGH (ref 65–99)
Glucose-Capillary: 228 mg/dL — ABNORMAL HIGH (ref 65–99)
Glucose-Capillary: 248 mg/dL — ABNORMAL HIGH (ref 65–99)

## 2016-02-05 MED ORDER — PIPERACILLIN-TAZOBACTAM 3.375 G IVPB
3.3750 g | Freq: Three times a day (TID) | INTRAVENOUS | Status: DC
  Administered 2016-02-05 – 2016-02-07 (×7): 3.375 g via INTRAVENOUS
  Filled 2016-02-05 (×9): qty 50

## 2016-02-05 MED ORDER — IPRATROPIUM-ALBUTEROL 0.5-2.5 (3) MG/3ML IN SOLN
3.0000 mL | Freq: Three times a day (TID) | RESPIRATORY_TRACT | Status: DC
  Administered 2016-02-05 – 2016-02-07 (×6): 3 mL via RESPIRATORY_TRACT
  Filled 2016-02-05 (×6): qty 3

## 2016-02-05 NOTE — Progress Notes (Signed)
   Patient unavailable (15:00)  Will follow up 02/06/16  - Rev. Chaplain Kipp Broodnthony Izora Benn MDiv ThM

## 2016-02-05 NOTE — Progress Notes (Signed)
Speech Language Pathology Treatment: Dysphagia  Patient Details Name: Jonathan [name] MRN: [id number] DOB: 1940/02/24 Today's Date: 02/05/2016 Time: [id number] SLP Time Calculation (min) (ACUTE ONLY): 10 min  Assessment / Plan / Recommendation Clinical Impression  Pt eating better today per his wife, who just finished feeding pt his lunch.  Observed with consumption of end of meal, and continues to tolerate modified diet of dysphagia 1, nectar-thick liquids adequately.  Risk of aspiration is inherent.  Jonathan [name], who has been coping with her husband's dx for quite a while, verbalizes the information she has learned from Palliative Medicine, specifically the trajectory of dementia and its impact on swallowing.  We discussed the importance of good oral hygiene and paced, safe feeding.  Recommend continuing this diet upon D/C back to SNF.    HPI HPI: Jonathan P Reddickis a 76 y.o.malewith a history of COPD and advanced dementia, GERD, hiatal hernia,  who presents from SNF for fever and cough. Dx with PNA with primary concern for aspiration PNA due to recurrent fever in the absence of cutaneous or urinary source. CXR negative.       SLP Plan  Continue with current plan of care     Recommendations  Diet recommendations: Dysphagia 1 (puree);Nectar-thick liquid Liquids provided via: Cup Medication Administration: Crushed with puree Supervision: Trained caregiver to feed patient Compensations: Slow rate;Small sips/bites Postural Changes and/or Swallow Maneuvers: Seated upright 90 degrees                Follow up Recommendations: Skilled Nursing facility Plan: Continue with current plan of care       GO                Blenda MountsCouture, Niema Carrara Laurice 02/05/2016, 1:41 PM

## 2016-02-05 NOTE — Care Management Note (Signed)
Case Management Note  Patient Details  Name: Clent DemarkDanny P Jacuinde MRN: [id number] Date of Birth: 05/12/1940  Subjective/Objective:                  Patient was admitted with CVA, PNA. [facility]  CM will notify CSW that patient is from a facility and will continue to follow for discharge needs pending discharge disposition and physician orders.   Action/Plan:   Expected Discharge Date:   (UNKNOWN)               Expected Discharge Plan:     In-House Referral:     Discharge planning Services     Post Acute Care Choice:    Choice offered to:     DME Arranged:    DME Agency:     HH Arranged:    HH Agency:     Status of Service:     If discussed at [employer] Length of Stay Meetings, dates discussed:    Additional Comments:  Anda KraftRobarge, Zoe Creasman C, RN 02/05/2016, 10:15 AM

## 2016-02-05 NOTE — Progress Notes (Addendum)
STROKE TEAM PROGRESS NOTE   HISTORY OF PRESENT ILLNESS (per record) This is a 76-yo man who is transferred from [facility] for evaluation of a stroke that was incidentally discovered on a CT of the head obtained during his admission. History is obtained from the review of the patient's medical record as well as d/w his wife who is present at the bedside.   He was admitted to [facility] on 02/02/16 after presenting from his nursing facility with fever and cough. He had been febrile for about 10 days PTA. He was evaluated and treated for presumed PNA with IV ciprofloxacin with reported improvement. His fever returned and he would not swallow. He developed an increased oxygen requirement with leukocytosis. He was started on steroids and abx but symptoms continued to worsen and EMS was called. He was found to be hypoxic with SpO2 in the 80s. He was transported to the ED where he was placed on the sepsis protocol. He was also found to have hyponatremia with an initial sodium 154. Because of persistently worsening mental status, CT of the head was ordered along with ammonia, TSH, and EEG. CT of the head ultimately showed a subacute left occipital infarction with some hemorrhagic conversion. This has prompted transfer the patient to [facility] for neurologic evaluation by the stroke service.  While he was at [facility], he was seen in consultation by palliative care. His wife met with the palliative care provider and they discussed the expected trajectory of his dementia. His wife indicated that she would not want PEG tube placement and acknowledge that the patient's quality of life has been poor for the past several months. She wants him to be treated for pneumonia but does not desire heroic measures. The patient is DO NOT RESUSCITATE/DO NOT INTUBATE.  Regarding his dementia, the patient's wife reports that he has been diagnosed with vascular dementia and has been progressively worsening over the past 10  years. More substantial changes of occurred over the past 12 months. He has been nonverbal since January 2017. She states that he has been bedbound since October 2017. She typically goes to his nursing home to feed himself breakfast and dinner and states that she is not sure that he recognizes who she has any longer.  Patient was not administered IV t-PA secondary to non-stroke presentation, unknown time of onset.    SUBJECTIVE (INTERVAL HISTORY) Wife at bedside. Pt back from EEG which showed no seizure. Pt has advanced dementia and wife talked with palliative care and plan is to have hospice follow up in NH once he gets back to NH.    OBJECTIVE Temp:  [97.6 F (36.4 C)-99 F (37.2 C)] 97.6 F (36.4 C) (01/29 0528) Pulse Rate:  [68-87] 69 (01/29 0528) Resp:  [16-18] 18 (01/29 0528) BP: (109-129)/(54-65) 109/57 (01/29 0528) SpO2:  [91 %-98 %] 96 % (01/29 0651)  CBC:  Recent Labs Lab 02/02/16 1312 02/02/16 1959 02/03/16 0415  WBC 17.0* 15.2* 12.3*  NEUTROABS 14.2*  --   --   HGB 12.7* 11.9* 10.8*  HCT 42.0 40.1 36.5*  MCV 96.3 98.5 97.9  PLT 315 283 932    Basic Metabolic Panel:  Recent Labs Lab 02/04/16 0725 02/04/16 1322  NA 152* 151*  K 3.0* 4.1  CL 119* 121*  CO2 26 24  GLUCOSE 156* 282*  BUN 29* 30*  CREATININE 0.98 1.23  CALCIUM 8.3* 8.5*    Lipid Panel:    Component Value Date/Time   CHOL 116  04/19/2013 1043   TRIG 84 04/19/2013 1043   HDL 46 04/19/2013 1043   CHOLHDL 2.5 04/19/2013 1043   VLDL 17 04/19/2013 1043   LDLCALC 53 04/19/2013 1043   HgbA1c:  Lab Results  Component Value Date   HGBA1C 6.4 (H) 10/08/2013   Urine Drug Screen: No results found for: LABOPIA, COCAINSCRNUR, LABBENZ, AMPHETMU, THCU, LABBARB    IMAGING I have personally reviewed the radiological images below and agree with the radiology interpretations.  Ct Head Wo Contrast 02/04/2016 New area of infarct in the left posterior artery territory involving the posterior temporal  and occipital lobe. This is most consistent with subacute infarct. No hemorrhage Extensive atrophy and chronic microvascular ischemic changes.   EEG - This is an abnormal EEG demonstrating a mild-moderate diffuse slowing of electrocerebral activity.  This can be seen in a wide variety of encephalopathic state including those of a toxic, metabolic, or degenerative nature.  There were no focal, hemispheric, or lateralizing features.  No epileptiform activity was recorded.   PHYSICAL EXAM General: Chronically ill-appearing elderly male lying in hospital bed with head extended, looking up and to the right--his wife says that this is a typical posture for him. He is nonverbal. He will look at me on either side during the exam. He does not follow any commands.  HEENT: Normocephalic. He resists passive neck movement. His mouth hangs partly open and mucous membranes are dry. Sclerae anicteric. Mild conjunctival injection.  CV: Regular, no murmur. Carotid pulses full and symmetric, no bruits. Distal pulses 2+ and symmetric.  Lungs: Coarse breath sounds noted on anterior exam bilaterally.  Abdomen: Soft, non-distended, non-tender. Bowel sounds present x4.  Extremities: No C/C/E. Neuro:  CN: Pupils are equal and round. They are symmetrically reactive from 3-->2 mm. He inconsistently blink to visual threat. His eyes are conjugate. He will look towards me when I move from side to side with grossly intact horizontal saccades. No nystagmus is appreciated. He grimaces to supraorbital pressure bilaterally. Corneals are intact. His face appears to be largely symmetric at rest and he has a symmetric grimace. There is cranial nerves cannot be accurately assessed as he does not participate with the evaluation.  Motor: Reduced muscle bulk throughout. He has gegenhalten paratonia. No significant spontaneous movement is observed. No tremor or other abnormal movements.  Sensation: He grimaces and withdraws from light noxious  stimuli 4. DTRs: Brisk 2+, symmetric. Toes upgoing bilaterally. He has positive palmomentals bilaterally, positive grasps bilaterally, positive glabellar, positive snout, and positive suck reflexes..  Coordination and gait: These cannot be assessed as the patient is unable to participate with the examination.   ASSESSMENT/PLAN Mr. OMID DEARDORFF is a 76 y.o. male with history of progressive dementia, HTN, HLD, DB and hx tobacco abuse admitted 02/02/16 to [facility] with fever and cough. Stroke found on CT done during this hospitalization. He did not receive IV t-PA due to non-stroke presentation, incidental finding.   Stroke:  Incidental subacute L PCA infarct secondary to unknown source  Pt baseline advanced dementia, nonverbal, badbound  CT head L PCA infarct posterior temporal and occipital lobe, subacute  EEG diffuse slow and no seizure  Lovenox 40 mg sq daily for VTE prophylaxis  DIET - DYS 1 Room service appropriate? Yes; Fluid consistency: Nectar Thick  No antithrombotic prior to admission, now on aspirin 81 mg daily.   Patient's wife counseled to be compliant with his antithrombotic medications  Ongoing stroke risk factor management  Therapy recommendations:  SNF  Disposition:  Plan return to [facility] with palliative services through [facility]  Hypertension  Stable  BP goal normotensive  Hyperlipidemia  Home meds:  No statin  Will not check LDL given plans for palliative care  Diabetes  Continue current treatment  Will not check HgbA1c given plans for palliative care  Other Stroke Risk Factors  Advanced age  Coronary artery disease  Former cigarette smoker  Other Active Problems  Advanced Baseline dementia  Acute encephalopathy  Sepsis  Acute on chronic respiratory failure w/ hypoxia  COPD  Hypernatremia  GERD  Hospital day # 3  Neurology will sign off. Please call with questions. No neuro follow up needed. Thanks  for the consult.  Rosalin Hawking, MD PhD Stroke Neurology 02/05/2016 5:54 PM   To contact Stroke Continuity provider, please refer to [email or web address] After hours, contact General Neurology

## 2016-02-05 NOTE — [place] FL2 (Signed)
[place] MEDICAID FL2 LEVEL OF CARE SCREENING TOOL     IDENTIFICATION  Patient Name: Jonathan [name] Birthdate: 10/13/1940 Sex: male Admission Date (Current Location): 02/02/2016  [place] and [place] Number:  Jonathan [name] [id number] R Facility and Address:  [facility], [address] [place], [place] [address]      Provider Number: [id number]  Attending Physician Name and Address:  Leatha Gilding, MD  Relative Name and Phone Number:       Current Level of Care: Hospital Recommended Level of Care: Skilled Nursing Facility Prior Approval Number:    Date Approved/Denied:   PASRR Number:   [id number]   Discharge Plan: SNF    Current Diagnoses: Patient Active Problem List   Diagnosis Date Noted  . Hypernatremia 02/04/2016  . Altered mental status 02/04/2016  . Pneumonia 02/02/2016  . Dementia with behavioral disturbance 10/09/2013  . UTI (lower urinary tract infection) 10/08/2013  . Acute encephalopathy 10/08/2013  . At risk for falling 02/11/2013  . Hypokalemia 02/03/2013  . Hypomagnesemia 02/03/2013  . SOB (shortness of breath) 02/02/2013  . Chest pain 02/02/2013  . Dementia 10/19/2012  . Smoker   . H. pylori infection   . Hiatal hernia   . Gastritis   . Right carotid bruit 03/01/2011  . Emphysema of lung (HCC)   . Blood transfusion   . Asthma, chronic   . DM (diabetes mellitus), secondary, uncontrolled, with peripheral vascular complications (HCC)   . GERD (gastroesophageal reflux disease)   . Hyperlipidemia   . Hypertension   . Nephrolithiasis   . Diverticulosis   . Anemia, iron deficiency 06/03/2010  . B12 deficiency 06/03/2010  . CAD, NATIVE VESSEL 12/18/2009    Orientation RESPIRATION BLADDER Height & Weight      (Disoriented x4)  O2 (4L/min) Incontinent Weight: 138 lb (62.6 kg) Height:  5\' 10"  (177.8 cm)  BEHAVIORAL SYMPTOMS/MOOD NEUROLOGICAL BOWEL NUTRITION STATUS      Incontinent Diet (Dysphagia 1 (Puree)  solids;Nectar thick liquidOrder thickener from pharmacy; Prohibited food (jello, ice cream, thin soups);Remove water pitcher)  AMBULATORY STATUS COMMUNICATION OF NEEDS Skin   Extensive Assist Non-Verbally Normal                       Personal Care Assistance Level of Assistance  Bathing, Feeding, Dressing Bathing Assistance: Maximum assistance Feeding assistance: Maximum assistance Dressing Assistance: Maximum assistance     Functional Limitations Info  Sight, Hearing, Speech Sight Info: Adequate Hearing Info: Adequate Speech Info: Impaired (mute)    SPECIAL CARE FACTORS FREQUENCY                       Contractures Contractures Info: Not present    Additional Factors Info  Code Status, Allergies Code Status Info: DNR Allergies Info: No Known Allergies           Current Medications (02/05/2016):  This is the current hospital active medication list Current Facility-Administered Medications  Medication Dose Route Frequency Provider Last Rate Last Dose  . albuterol (PROVENTIL) (2.5 MG/3ML) 0.083% nebulizer solution 3 mL  3 mL Nebulization Q4H PRN Tyrone Nine, MD      . arformoterol [facility]) nebulizer solution 15 mcg  15 mcg Nebulization BID Tyrone Nine, MD   15 mcg at 02/05/16 0858  . aspirin chewable tablet 81 mg  81 mg Oral Q breakfast Tyrone Nine, MD   81 mg at 02/05/16 0827  . cycloSPORINE (RESTASIS) 0.05 %  ophthalmic emulsion 1 drop  1 drop Both Eyes BID Tyrone Nineyan B Grunz, MD   1 drop at 02/04/16 2111  . enoxaparin (LOVENOX) injection 40 mg  40 mg Subcutaneous Q24H Tyrone Nineyan B Grunz, MD   40 mg at 02/04/16 2111  . guaiFENesin (MUCINEX) 12 hr tablet 600 mg  600 mg Oral BID PRN Tyrone Nineyan B Grunz, MD      . insulin aspart (novoLOG) injection 0-15 Units  0-15 Units Subcutaneous Q4H Narda Bondsalph A Nettey, MD   2 Units at 02/05/16 0825  . ipratropium-albuterol (DUONEB) 0.5-2.5 (3) MG/3ML nebulizer solution 3 mL  3 mL Nebulization TID Costin Otelia SergeantM Gherghe, MD      . OCUSOFT LID SCRUB PADS  1 each  1 each Both Eyes BID Tyrone Nineyan B Grunz, MD      . pantoprazole (PROTONIX) EC tablet 40 mg  40 mg Oral Daily Tyrone Nineyan B Grunz, MD   40 mg at 02/05/16 1054  . piperacillin-tazobactam (ZOSYN) IVPB 3.375 g  3.375 g Intravenous Q8H Costin Otelia SergeantM Gherghe, MD         Discharge Medications: Please see discharge summary for a list of discharge medications.  Relevant Imaging Results:  Relevant Lab Results:   Additional Information SSN: [id number]   Patient [facility] for hosp[facility] services in the facility.   Dominic PeaJeneya G McLean, LCSW

## 2016-02-05 NOTE — Progress Notes (Signed)
PROGRESS NOTE    Jonathan [name]  YNW:[id number] DOB: 29-Sep-1940 DOA: 02/02/2016 PCP: Colon Branch, MD   Brief Narrative: Jonathan [name] is a 76 y.o. male with a history of COPD and dementia. Patient presented to the emergency department with confusion. Workup for possible infection underwent since patient presented with sepsis.   Assessment & Plan:   Active Problems:   DM (diabetes mellitus), secondary, uncontrolled, with peripheral vascular complications (HCC)   GERD (gastroesophageal reflux disease)   Dementia with behavioral disturbance   Pneumonia   Hypernatremia   Altered mental status  Sepsis - Resolved after IV boluses and initiation of antibiotics. Unknown source. ?aspiration pneumonia vs urinary infection. Cultures pending. - continue Vancomycin/Zosyn - blood cultures NGTD, urine culture negative  Subacute stroke - neuro consulted, no aggressive workup, d/c with hospice tomorrow  Acute on chronic respiratory failure with hypoxia - Patient has baseline COPD.  - treat possible pneumonia  COPD - continue Brovana, Duonebs and albuterol prn  Hypernatremia - Sodium stable. Encourage po intake. Stop fluids as he is eating more today   Diabetes mellitus - continue SSI - continue to hold home metformin  GERD - Continue PPI  Advanced dementia - Patient has waxing and waning altered mental status at baseline. Wife thinks it has possibly lasted longer than usual. Respiratory effort normal. - continue Aricept  Altered mental status - improving  Essential hypertension - continue to hold amlodipine     DVT prophylaxis: Lovenox Code Status: DNR/DNI Family Communication: Wife at bedside Disposition Plan: Anticipate discharge back to SNF in 1-2 days pending AMS workup   Consultants:   Palliative care  Procedures:  None  Antimicrobials:   Vancomycin (1/26>>  Zosyn (1/26>>    Subjective: Patient not responding to questions. Somnolent still  today.  Objective: Vitals:   02/05/16 0528 02/05/16 0651 02/05/16 0858 02/05/16 0952  BP: (!) 109/57   131/60  Pulse: 69   75  Resp: 18   20  Temp: 97.6 F (36.4 C)   97.7 F (36.5 C)  TempSrc: Oral   Axillary  SpO2: 95% 96% 96% 98%  Weight:      Height:        Intake/Output Summary (Last 24 hours) at 02/05/16 1133 Last data filed at 02/05/16 0951  Gross per 24 hour  Intake           2462.5 ml  Output              700 ml  Net           1762.5 ml   Filed Weights   02/02/16 1219 02/02/16 1801  Weight: 68.9 kg (152 lb) 62.6 kg (138 lb)    Examination:  General exam: Appears calm and comfortable. Sleeping Respiratory system: diffuse transmitted upper respiratory sounds. Respiratory effort normal. Cardiovascular system: S1 & S2 heard, RRR. No murmurs Gastrointestinal system: Abdomen is nondistended, soft and nontender. Normal bowel sounds heard. Central nervous system: Obtunded Extremities: No edema. No calf tenderness Skin: No cyanosis. No rashes    Data Reviewed: I have personally reviewed following labs and imaging studies  CBC:  Recent Labs Lab 02/02/16 1312 02/02/16 1959 02/03/16 0415  WBC 17.0* 15.2* 12.3*  NEUTROABS 14.2*  --   --   HGB 12.7* 11.9* 10.8*  HCT 42.0 40.1 36.5*  MCV 96.3 98.5 97.9  PLT 315 283 280   Basic Metabolic Panel:  Recent Labs Lab 02/03/16 1322 02/03/16 2112 02/04/16 0137 02/04/16 0725 02/04/16 1322  NA 151* 150* 149* 152* 151*  K 4.0 3.6 3.0* 3.0* 4.1  CL 123* 119* 119* 119* 121*  CO2 25 25 25 26 24   GLUCOSE 265* 307* 235* 156* 282*  BUN 31* 32* 30* 29* 30*  CREATININE 1.05 0.94 0.95 0.98 1.23  CALCIUM 8.2* 8.4* 8.3* 8.3* 8.5*   GFR: Estimated Creatinine Clearance: 46.7 mL/min (by C-G formula based on SCr of 1.23 mg/dL). Liver Function Tests:  Recent Labs Lab 02/02/16 1312  AST 17  ALT 18  ALKPHOS 95  BILITOT 0.3  PROT 6.5  ALBUMIN 2.8*   No results for input(s): LIPASE, AMYLASE in the last 168  hours.  Recent Labs Lab 02/04/16 1046  AMMONIA 58*   Coagulation Profile: No results for input(s): INR, PROTIME in the last 168 hours. Cardiac Enzymes: No results for input(s): CKTOTAL, CKMB, CKMBINDEX, TROPONINI in the last 168 hours. BNP (last 3 results) No results for input(s): PROBNP in the last 8760 hours. HbA1C: No results for input(s): HGBA1C in the last 72 hours. CBG:  Recent Labs Lab 02/04/16 1552 02/04/16 2005 02/05/16 0009 02/05/16 0352 02/05/16 0738  GLUCAP 375* 318* 277* 228* 134*   Lipid Profile: No results for input(s): CHOL, HDL, LDLCALC, TRIG, CHOLHDL, LDLDIRECT in the last 72 hours. Thyroid Function Tests:  Recent Labs  02/04/16 1046  TSH 0.278*   Anemia Panel: No results for input(s): VITAMINB12, FOLATE, FERRITIN, TIBC, IRON, RETICCTPCT in the last 72 hours. Sepsis Labs:  Recent Labs Lab 02/02/16 1327 02/02/16 1640  LATICACIDVEN 3.00* 2.07*    Recent Results (from the past 240 hour(s))  Urine culture     Status: None   Collection Time: 02/02/16 12:40 PM  Result Value Ref Range Status   Specimen Description URINE, CLEAN CATCH  Final   Special Requests NONE  Final   Culture   Final    NO GROWTH Performed at [facility], [address] [place], [place] [address]    Report Status 02/04/2016 FINAL  Final  Blood Culture (routine x 2)     Status: None (Preliminary result)   Collection Time: 02/02/16  1:54 PM  Result Value Ref Range Status   Specimen Description BLOOD BLOOD LEFT FOREARM  Final   Special Requests BOTTLES DRAWN AEROBIC AND ANAEROBIC 5 CC  Final   Culture   Final    NO GROWTH 2 DAYS Performed at [facility], [address] [place], [place] [address]    Report Status PENDING  Incomplete  Blood Culture (routine x 2)     Status: None (Preliminary result)   Collection Time: 02/02/16  2:05 PM  Result Value Ref Range Status   Specimen Description BLOOD BLOOD RIGHT HAND  Final   Special Requests BOTTLES DRAWN  AEROBIC AND ANAEROBIC 5 CC  Final   Culture   Final    NO GROWTH 2 DAYS Performed at [facility], [address] [place], [place] [address]    Report Status PENDING  Incomplete  MRSA PCR Screening     Status: None   Collection Time: 02/02/16  6:20 PM  Result Value Ref Range Status   MRSA by PCR NEGATIVE NEGATIVE Final    Comment:        The GeneXpert MRSA Assay (FDA approved for NASAL specimens only), is one component of a comprehensive MRSA colonization surveillance program. It is not intended to diagnose MRSA infection nor to guide or monitor treatment for MRSA infections.  Radiology Studies: Ct Head Wo Contrast  Result Date: 02/04/2016 CLINICAL DATA:  Altered mental status.  Dementia. EXAM: CT HEAD WITHOUT CONTRAST TECHNIQUE: Contiguous axial images were obtained from the base of the skull through the vertex without intravenous contrast. COMPARISON:  CT head 05/18/2015 FINDINGS: Brain: Moderate to advanced atrophy unchanged. Hypodensity throughout the cerebral white matter is unchanged. Chronic infarct left pons New area of hypodensity left posterior temporal and inferior occipital lobe compatible with subacute infarction. Negative for hemorrhage. Negative for mass lesion. Vascular: No hyperdense vessel or unexpected calcification. Skull: Negative Sinuses/Orbits: Complete opacification of the right frontal sinus as noted previously. Mucosal edema in the right ethmoid sinus. No orbital lesion. Other: None IMPRESSION: New area of infarct in the left posterior artery territory involving the posterior temporal and occipital lobe. This is most consistent with subacute infarct. No hemorrhage Extensive atrophy and chronic microvascular ischemic changes. These results will be called to the ordering clinician or representative by the Radiologist Assistant, and communication documented in the PACS or zVision Dashboard. Electronically Signed   By: Marlan Palau M.D.   On:  02/04/2016 13:04        Scheduled Meds: . arformoterol  15 mcg Nebulization BID  . aspirin  81 mg Oral Q breakfast  . cycloSPORINE  1 drop Both Eyes BID  . enoxaparin (LOVENOX) injection  40 mg Subcutaneous Q24H  . insulin aspart  0-15 Units Subcutaneous Q4H  . ipratropium-albuterol  3 mL Nebulization TID  . OCUSOFT LID SCRUB  1 each Both Eyes BID  . pantoprazole  40 mg Oral Daily  . piperacillin-tazobactam (ZOSYN)  IV  3.375 g Intravenous Q8H   Continuous Infusions: . dextrose 100 mL/hr at 02/04/16 1700     LOS: 3 days     Pamella Pert, MD, PhD [facility] 02/05/2016, 11:33 AM Pager: [phone]  If 7PM-7AM, please contact night-coverage [email or web address] Password [facility] 02/05/2016, 11:33 AM

## 2016-02-05 NOTE — Progress Notes (Signed)
Daily Progress Note   Patient Name: Jonathan [name]       Date: 02/05/2016 DOB: 1940/03/29  Age: 76 y.o. MRN#: [id number] Attending Physician: Jonathan Gilding, MD Primary Care Physician: Jonathan Branch, MD Admit Date: 02/02/2016  Reason for Consultation/Follow-up: Establishing goals of care  75 y.o. male  with past medical history of COPD, advanced dementia admitted on 02/02/2016 with sepsis, possible aspiration PNA initially at [facility] long, now transferred [facility] for acute stroke. Seen by neurology, recommendations for continuation of supportive care.   Subjective:  withdraws to pain, other wise does not open eyes, does not respond  Wife at bedside.   Length of Stay: 3  Current Medications: Scheduled Meds:  . arformoterol  15 mcg Nebulization BID  . aspirin  81 mg Oral Q breakfast  . cycloSPORINE  1 drop Both Eyes BID  . enoxaparin (LOVENOX) injection  40 mg Subcutaneous Q24H  . insulin aspart  0-15 Units Subcutaneous Q4H  . ipratropium-albuterol  3 mL Nebulization TID  . OCUSOFT LID SCRUB  1 each Both Eyes BID  . pantoprazole  40 mg Oral Daily  . piperacillin-tazobactam (ZOSYN)  IV  3.375 g Intravenous Q8H    Continuous Infusions: . dextrose 100 mL/hr at 02/04/16 1700    PRN Meds: albuterol, guaiFENesin  Physical Exam         Appears chronically ill contractures S1 S2 Clear Abdomen soft Muscle wasting Withdraws to pain Non verbal even at base line  Vital Signs: BP (!) 109/57 (BP Location: Right Arm)   Pulse 69   Temp 97.6 F (36.4 C) (Oral)   Resp 18   Ht 5\' 10"  (1.778 m)   Wt 62.6 kg (138 lb)   SpO2 96%   BMI 19.80 kg/m  SpO2: SpO2: 96 % O2 Device: O2 Device: Nasal Cannula O2 Flow Rate: O2 Flow Rate (L/min): 4 L/min  Intake/output summary:    Intake/Output Summary (Last 24 hours) at 02/05/16 0953 Last data filed at 02/05/16 0951  Gross per 24 hour  Intake           2462.5 ml  Output              700 ml  Net           1762.5 ml   LBM: Last BM Date:  02/04/16 Baseline Weight: Weight: 68.9 kg (152 lb) Most recent weight: Weight: 62.6 kg (138 lb)       Palliative Assessment/Data:    Flowsheet Rows   Flowsheet Row Most Recent Value  Intake Tab  Referral Department  Hospitalist  Unit at Time of Referral  Med/Surg Unit  Palliative Care Primary Diagnosis  Neurology  Palliative Care Type  Return patient Palliative Care  Reason for referral  Clarify Goals of Care, Counsel Regarding Hospice  Date first seen by Palliative Care  02/03/16  Clinical Assessment  Palliative Performance Scale Score  10%  Pain Max last 24 hours  4  Pain Min Last 24 hours  3  Dyspnea Max Last 24 Hours  4  Dyspnea Min Last 24 hours  3  Nausea Max Last 24 Hours  4  Nausea Min Last 24 Hours  3  Anxiety Max Last 24 Hours  3  Anxiety Min Last 24 Hours  2  Psychosocial & Spiritual Assessment  Palliative Care Outcomes  Patient/Family meeting held?  Yes  Who was at the meeting?  wife at bedside   Palliative Care Outcomes  Clarified goals of care      Patient Active Problem List   Diagnosis Date Noted  . Hypernatremia 02/04/2016  . Altered mental status 02/04/2016  . Pneumonia 02/02/2016  . Dementia with behavioral disturbance 10/09/2013  . UTI (lower urinary tract infection) 10/08/2013  . Acute encephalopathy 10/08/2013  . At risk for falling 02/11/2013  . Hypokalemia 02/03/2013  . Hypomagnesemia 02/03/2013  . SOB (shortness of breath) 02/02/2013  . Chest pain 02/02/2013  . Dementia 10/19/2012  . Smoker   . H. pylori infection   . Hiatal hernia   . Gastritis   . Right carotid bruit 03/01/2011  . Emphysema of lung (HCC)   . Blood transfusion   . Asthma, chronic   . DM (diabetes mellitus), secondary, uncontrolled, with peripheral  vascular complications (HCC)   . GERD (gastroesophageal reflux disease)   . Hyperlipidemia   . Hypertension   . Nephrolithiasis   . Diverticulosis   . Anemia, iron deficiency 06/03/2010  . B12 deficiency 06/03/2010  . CAD, NATIVE VESSEL 12/18/2009    Palliative Care Assessment & Plan   Patient Profile:    Assessment:  76 y.o. male  with past medical history of COPD, advanced dementia admitted on 02/02/2016 with sepsis, possible aspiration PNA, now with ischemic stroke L PCA territory, acute on chronic encephalopathy.   Recommendations/Plan:  In light of recent events, discussed goals of care again with wife, in particular, we discussed about the patient's acute stroke, high likelihood of ongoing decline and death, prognosis could be as short as few days to weeks now,she is already holding the bed and Jonathan [name], [facility]  Spiritual care per patient's wife request. Continue to provide supportive care to patient's wife, she is tearful this morning, she recognizes that dementia trajectory is one of ongoing decline, but is taken aback by the acute decline in this hospitalization now with the patient's stroke.    Code Status:    Code Status Orders        Start     Ordered   02/02/16 1813  Do not attempt resuscitation (DNR)  Continuous    Question Answer Comment  In the event of cardiac or respiratory ARREST Do not call a "code blue"   In the event of cardiac or respiratory ARREST Do not perform Intubation, CPR, defibrillation  or ACLS   In the event of cardiac or respiratory ARREST Use medication by any route, position, wound care, and other measures to relive pain and suffering. May use oxygen, suction and manual treatment of airway obstruction as needed for comfort.      02/02/16 1812    Code Status History    Date Active Date Inactive Code Status Order ID Comments User Context   08/04/2015 12:29 AM 08/04/2015  3:47 AM DNR [id number]  Derwood Kaplan, MD ED   10/08/2013  2:19 PM 10/12/2013  2:54 PM Full Code [id number]  Catarina Hartshorn, MD Inpatient   02/02/2013  5:04 AM 02/04/2013  5:16 PM Full Code [id number]  Eduard Clos, MD ED    Advance Directive Documentation   Flowsheet Row Most Recent Value  Type of Advance Directive  Healthcare Power of Attorney, Out of facility DNR (pink MOST or yellow form)  Pre-existing out of facility DNR order (yellow form or pink MOST form)  Yellow form placed in chart (order not valid for inpatient use)  "MOST" Form in Place?  No data       Prognosis:   < 2 weeks  Discharge Planning:  Skilled Nursing Facility with Hospice  Care plan was discussed with  Wife   Thank you for allowing the Palliative Medicine Team to assist in the care of this patient.   Time In: 9 Time Out: 9.25 Total Time 25 Prolonged Time Billed  no       Greater than 50%  of this time was spent counseling and coordinating care related to the above assessment and plan.  Rosalin Hawking, MD [phone] Please contact Palliative Medicine Team phone at [phone] for questions and concerns.

## 2016-02-05 NOTE — Procedures (Signed)
HPI:  76 year old male with mental status change and baseline dementia  TECHNICAL SUMMARY:  A multichannel referential and bipolar montage EEG using the standard international 10-20 system was performed on the patient described as awake and drowsy.  The dominant background activity consists of 6 hertz activity seen most prominantly over the posterior head region.  The backgound activity is nonreactive to eye opening and closing procedures.  Low voltage fast (beta) activity is distributed symmetrically and maximally over the anterior head regions.  ACTIVATION:  Stepwise photic stimulation and hyperventilation are not performed  EPILEPTIFORM ACTIVITY:  There were no spikes, sharp waves or paroxysmal activity.  SLEEP:  Physiologic drowsiness is noted, but no stage II sleep.  IMPRESSION:  This is an abnormal EEG demonstrating a mild-moderate diffuse slowing of electrocerebral activity.  This can be seen in a wide variety of encephalopathic state including those of a toxic, metabolic, or degenerative nature.  There were no focal, hemispheric, or lateralizing features.  No epileptiform activity was recorded.

## 2016-02-05 NOTE — Progress Notes (Signed)
EEG Completed; Results Pending  

## 2016-02-06 LAB — GLUCOSE, CAPILLARY
GLUCOSE-CAPILLARY: 113 mg/dL — AB (ref 65–99)
GLUCOSE-CAPILLARY: 225 mg/dL — AB (ref 65–99)
GLUCOSE-CAPILLARY: 88 mg/dL (ref 65–99)
Glucose-Capillary: 157 mg/dL — ABNORMAL HIGH (ref 65–99)
Glucose-Capillary: 170 mg/dL — ABNORMAL HIGH (ref 65–99)

## 2016-02-06 MED ORDER — AMOXICILLIN-POT CLAVULANATE 875-125 MG PO TABS: 1.0000 | ORAL_TABLET | Freq: Two times a day (BID) | ORAL | 0 refills | Status: DC

## 2016-02-06 MED ORDER — MORPHINE SULFATE (PF) 2 MG/ML IV SOLN
1.0000 mg | INTRAVENOUS | Status: DC | PRN
  Administered 2016-02-06: 1 mg via INTRAVENOUS
  Filled 2016-02-06: qty 1

## 2016-02-06 MED ORDER — PREDNISONE 20 MG PO TABS: 40.0000 mg | ORAL_TABLET | Freq: Every day | ORAL | Status: DC

## 2016-02-06 NOTE — Progress Notes (Signed)
[facility] to be [name] and [facility] here to transport when noted his sats dropping in the upper 70's and 80's. PRN duoneb given and MD notified. Discharge cancelled at this time.

## 2016-02-06 NOTE — Progress Notes (Signed)
PROGRESS NOTE    PHUC KLUTTZ  ZOX:[id number] DOB: November 27, 1940 DOA: 02/02/2016 PCP: Colon Branch, MD  Brief Narrative: Jonathan [name] is a 76 y.o. male with a history of COPD and dementia. Patient presented to the emergency department with confusion. Workup for possible infection underwent since patient presented with sepsis. Patient was found to have a subacute CVA and was transferred to [facility] for Neurology consult. He was about to be discharged to SNF with hospice on 1/30 when he started desatting likely aspiration after lunch.  Assessment & Plan:   Active Problems:   DM (diabetes mellitus), secondary, uncontrolled, with peripheral vascular complications (HCC)   GERD (gastroesophageal reflux disease)   Dementia with behavioral disturbance   Pneumonia   Hypernatremia   Altered mental status   Cerebral thrombosis with cerebral infarction   Healthcare-associated pneumonia   Sepsis - Resolved after IV boluses and initiation of antibiotics. Unknown source. ?aspiration pneumonia vs urinary infection. Cultures pending. - blood cultures NGTD, urine culture negative - was placed on Zosyn, transitioned to Augmentin prior to d/c however aspirated and now NPO.  Aspiration pneumonia / dysphagia / acute hypoxic respiratory failure - new onset 1/30, cancel d/c to SNF. D/w wife at bedside. He is DNR/DNI, will hold d/c and attempt suctioning, supplemental oxygen, supportive treatment - no ICU, no escalation of care - if he doesn't recover anticipate in-hospital death or residential hospice at best, discussed with wife he may have hours to days but appears less than 2 weeks.  Subacute stroke - neuro consulted, no aggressive workup, d/c with hospice planned for 1/30, however new aspiration event  Acute on chronic respiratory failure with hypoxia - Patient has baseline COPD.   COPD - continue Brovana, Duonebs and albuterol prn  Hypernatremia - due to poor po intake  Diabetes mellitus -  continue SSI - continue to hold home metformin  GERD - Continue PPI  Advanced dementia - Patient has waxing and waning altered mental status at baseline. Wife thinks it has possibly lasted longer than usual. Respiratory effort normal. - continue Aricept  Altered mental status - improving  Essential hypertension - continue to hold amlodipine    DVT prophylaxis: Lovenox Code Status: DNR/DNI Family Communication: Wife at bedside Disposition Plan: TBD   Consultants:   Palliative care  Procedures:  None  Antimicrobials:   Vancomycin (1/26>> 1/29  Zosyn (1/26>> 1/30   Subjective: - in distress due to shortness of breath   Objective: Vitals:   02/06/16 0100 02/06/16 0537 02/06/16 0852 02/06/16 0941  BP: 138/73 (!) 131/50  (!) 127/51  Pulse: 73 71  97  Resp: 20 20  20   Temp: 98 F (36.7 C) 97.7 F (36.5 C)  98.1 F (36.7 C)  TempSrc: Oral Oral  Oral  SpO2: 100% 100% 94% 95%  Weight:      Height:        Intake/Output Summary (Last 24 hours) at 02/06/16 1256 Last data filed at 02/06/16 0940  Gross per 24 hour  Intake              170 ml  Output              750 ml  Net             -580 ml   Filed Weights   02/02/16 1219 02/02/16 1801  Weight: 68.9 kg (152 lb) 62.6 kg (138 lb)    Examination:  General exam: in distress, tachypneic Respiratory system: coarse breath sounds Cardiovascular  system: S1 & S2 heard, RRR. No murmurs Gastrointestinal system: Abdomen is nondistended, soft and nontender. Normal bowel sounds heard. Central nervous system: Obtunded Extremities: No edema. No calf tenderness Skin: No cyanosis. No rashes    Data Reviewed: I have personally reviewed following labs and imaging studies  CBC:  Recent Labs Lab 02/02/16 1312 02/02/16 1959 02/03/16 0415  WBC 17.0* 15.2* 12.3*  NEUTROABS 14.2*  --   --   HGB 12.7* 11.9* 10.8*  HCT 42.0 40.1 36.5*  MCV 96.3 98.5 97.9  PLT 315 283 280   Basic Metabolic Panel:  Recent  Labs Lab 02/03/16 1322 02/03/16 2112 02/04/16 0137 02/04/16 0725 02/04/16 1322  NA 151* 150* 149* 152* 151*  K 4.0 3.6 3.0* 3.0* 4.1  CL 123* 119* 119* 119* 121*  CO2 25 25 25 26 24   GLUCOSE 265* 307* 235* 156* 282*  BUN 31* 32* 30* 29* 30*  CREATININE 1.05 0.94 0.95 0.98 1.23  CALCIUM 8.2* 8.4* 8.3* 8.3* 8.5*   GFR: Estimated Creatinine Clearance: 46.7 mL/min (by C-G formula based on SCr of 1.23 mg/dL). Liver Function Tests:  Recent Labs Lab 02/02/16 1312  AST 17  ALT 18  ALKPHOS 95  BILITOT 0.3  PROT 6.5  ALBUMIN 2.8*   No results for input(s): LIPASE, AMYLASE in the last 168 hours.  Recent Labs Lab 02/04/16 1046  AMMONIA 58*   Coagulation Profile: No results for input(s): INR, PROTIME in the last 168 hours. Cardiac Enzymes: No results for input(s): CKTOTAL, CKMB, CKMBINDEX, TROPONINI in the last 168 hours. BNP (last 3 results) No results for input(s): PROBNP in the last 8760 hours. HbA1C: No results for input(s): HGBA1C in the last 72 hours. CBG:  Recent Labs Lab 02/05/16 2044 02/05/16 2333 02/06/16 0411 02/06/16 0750 02/06/16 1143  GLUCAP 228* 214* 113* 157* 170*   Lipid Profile: No results for input(s): CHOL, HDL, LDLCALC, TRIG, CHOLHDL, LDLDIRECT in the last 72 hours. Thyroid Function Tests:  Recent Labs  02/04/16 1046  TSH 0.278*   Anemia Panel: No results for input(s): VITAMINB12, FOLATE, FERRITIN, TIBC, IRON, RETICCTPCT in the last 72 hours. Sepsis Labs:  Recent Labs Lab 02/02/16 1327 02/02/16 1640  LATICACIDVEN 3.00* 2.07*    Recent Results (from the past 240 hour(s))  Urine culture     Status: None   Collection Time: 02/02/16 12:40 PM  Result Value Ref Range Status   Specimen Description URINE, CLEAN CATCH  Final   Special Requests NONE  Final   Culture   Final    NO GROWTH Performed at [facility], [address] [place], [place] [address]    Report Status 02/04/2016 FINAL  Final  Blood Culture (routine x 2)      Status: None (Preliminary result)   Collection Time: 02/02/16  1:54 PM  Result Value Ref Range Status   Specimen Description BLOOD BLOOD LEFT FOREARM  Final   Special Requests BOTTLES DRAWN AEROBIC AND ANAEROBIC 5 CC  Final   Culture   Final    NO GROWTH 3 DAYS Performed at [facility], [address] [place], [place] [address]    Report Status PENDING  Incomplete  Blood Culture (routine x 2)     Status: None (Preliminary result)   Collection Time: 02/02/16  2:05 PM  Result Value Ref Range Status   Specimen Description BLOOD BLOOD RIGHT HAND  Final   Special Requests BOTTLES DRAWN AEROBIC AND ANAEROBIC 5 CC  Final   Culture  Final    NO GROWTH 3 DAYS Performed at [facility], [address] [place], [place] [address]    Report Status PENDING  Incomplete  MRSA PCR Screening     Status: None   Collection Time: 02/02/16  6:20 PM  Result Value Ref Range Status   MRSA by PCR NEGATIVE NEGATIVE Final    Comment:        The GeneXpert MRSA Assay (FDA approved for NASAL specimens only), is one component of a comprehensive MRSA colonization surveillance program. It is not intended to diagnose MRSA infection nor to guide or monitor treatment for MRSA infections.          Radiology Studies: No results found.      Scheduled Meds: . arformoterol  15 mcg Nebulization BID  . aspirin  81 mg Oral Q breakfast  . cycloSPORINE  1 drop Both Eyes BID  . enoxaparin (LOVENOX) injection  40 mg Subcutaneous Q24H  . insulin aspart  0-15 Units Subcutaneous Q4H  . ipratropium-albuterol  3 mL Nebulization TID  . pantoprazole  40 mg Oral Daily  . piperacillin-tazobactam (ZOSYN)  IV  3.375 g Intravenous Q8H   Continuous Infusions:    LOS: 4 days     Pamella Pert, MD, PhD [facility] 02/06/2016, 12:56 PM Pager: [phone]  If 7PM-7AM, please contact night-coverage [email or web address] Password [email or web address] 02/06/2016, 12:56 PM

## 2016-02-06 NOTE — Clinical Social Work Note (Signed)
CSW notified of discharge being cancelled due to destats. CSW will update facility. CSW will continue to follow.   Dede QuerySarah Sharnise Blough, MSW, LCSW  Clinical Social Worker  [phone]

## 2016-02-06 NOTE — Progress Notes (Signed)
Nutrition Brief Note  Patient identified due to low braden score  Per chart, plan was to discharge pt to hospice care with comfort feeding. Discharge canceled due to sats dropping, possibly due to aspiration with lunch. Pt now NPO. Labs and medications reviewed.   No nutrition interventions warranted at this time. If nutrition issues arise, please consult RD.   Dorothea Ogleeanne Shamari Lofquist RD, CSP, LDN Inpatient Clinical Dietitian Pager: [phone] After Hours Pager: [phone]

## 2016-02-06 NOTE — Care Management Important Message (Signed)
Important Message  Patient Details  Name: Jonathan DemarkDanny P Kimmer MRN: [id number] Date of Birth: 10/04/1940   Medicare Important Message Given:  Yes    [name] 02/06/2016, 12:02 PM

## 2016-02-06 NOTE — Discharge Summary (Deleted)
Physician Discharge Summary  Jonathan [name] WUJ:[id number] DOB: 03/28/1940 DOA: 02/02/2016  PCP: Colon Branch, MD  Admit date: 02/02/2016 Discharge date: 02/06/2016  Admitted From: SNF Disposition:  SNF with hospice  Recommendations for Outpatient Follow-up:  Follow up with hospice services  Discharge Condition: guarded, with hospice CODE STATUS: DNR Diet recommendation: comfort feeding  HPI: Per Dr. Jarvis Newcomer, Jonathan [name] is a 76 y.o. male with a history of COPD and advanced dementia who presents from SNF for fever and cough. History is obtained from medical records and patient's wife due to patients confusion from nonverbal advanced dementia. The problem began 10 days PTA when SNF staff noted a fever, reportedly abnormal lung findings on exam prompting work up that included WBC 19k, negative CXR, UA, and flu swab as well as elevated creatinine. He was given 2L of IVF and daily IV cipro. He continued to take little by mouth but had subjective improvement in symptoms. He was more interactive and vital signs were reportedly normal. 4 days ago, however a nurse that sees him and his wife who feeds him twice daily at the SNF reports he became febrile again and would not swallow anything. He would take it in his mouth and spit it out. He continued to have "rattling" lung sounds which are his baseline. She denies seeing a choking event, but he's required 3L of oxygen by nasal cannula whereas his usual is 2LPM. WBC at that time was reportedly 13k. IV fluids, steroids and antibiotics were started at that time. Today symptoms continued to worsen so EMS was called. At time of assessment SpO2 was in 80%'s, requiring increased oxygen to 4LPM.   Hospital Course: Discharge Diagnoses:  Active Problems:   DM (diabetes mellitus), secondary, uncontrolled, with peripheral vascular complications (HCC)   GERD (gastroesophageal reflux disease)   Dementia with behavioral disturbance   Pneumonia  Hypernatremia   Altered mental status   Cerebral thrombosis with cerebral infarction   Healthcare-associated pneumonia  Sepsis - Resolved after IV boluses and initiation of antibiotics. Unknown source but most likely aspiration pneumonia. Initially there was a concern for UTI however culture remained negative. He was placed on Zosyn and will be transitioned to Augmentin for 5 additional days Subacute stroke - neuro consulted, no aggressive workup, started on Aspirin. D/C with hospice Acute on chronic respiratory failure with hypoxia - Patient has baseline COPD, for comfort and to help with breathing will treat with Augmentin and Prednisone as below. COPD - continue home medications Hypernatremia - Sodium stable. Encourage po intake. Comfort feeding. High risk of aspiration, d/w wife who is in understanding Diabetes mellitus - continue home medications GERD - Continue PPI Advanced dementia - Patient has waxing and waning altered mental status at baseline. Now with hospice Altered mental status - improving Essential hypertension - resume home medications  Discharge Instructions   Allergies as of 02/06/2016   No Known Allergies     Medication List    STOP taking these medications   levofloxacin 500 MG/100ML Soln Commonly known as:  LEVAQUIN     TAKE these medications   albuterol (2.5 MG/3ML) 0.083% nebulizer solution Commonly known as:  PROVENTIL Take 3 mLs by nebulization every 4 (four) hours as needed for shortness of breath or wheezing.   amLODipine 10 MG tablet Commonly known as:  NORVASC Take 1 tablet (10 mg total) by mouth daily. What changed:  when to take this   amoxicillin-clavulanate 875-125 MG tablet Commonly known as:  AUGMENTIN Take 1 tablet  by mouth 2 (two) times daily. For 5 days   aspirin 81 MG chewable tablet Chew 81 mg by mouth daily with breakfast.   BROVANA 15 MCG/2ML Nebu Generic drug:  arformoterol Take 1 vial by nebulization 2 (two) times daily.     CERTAVITE/ANTIOXIDANTS PO Take 1 tablet by mouth daily with breakfast.   cyanocobalamin 1000 MCG/ML injection Commonly known as:  (VITAMIN B-12) Inject 1 mL (1,000 mcg total) into the muscle every 30 (thirty) days. What changed:  additional instructions   cycloSPORINE 0.05 % ophthalmic emulsion Commonly known as:  RESTASIS Place 1 drop into both eyes 2 (two) times daily.   donepezil 10 MG tablet Commonly known as:  ARICEPT Take 10 mg by mouth at bedtime.   ergocalciferol 50000 units capsule Commonly known as:  VITAMIN D2 Take 50,000 Units by mouth every 30 (thirty) days. Takes on the 19th of every month   ipratropium-albuterol 0.5-2.5 (3) MG/3ML Soln Commonly known as:  DUONEB Take 3 mLs by nebulization every 8 (eight) hours.   metFORMIN 500 MG tablet Commonly known as:  GLUCOPHAGE Take 500-1,000 mg by mouth 2 (two) times daily. Takes 1000mg  in the morning and 500mg  in the evening   metFORMIN 1000 MG tablet Commonly known as:  GLUCOPHAGE Take 1 tablet (1,000 mg total) by mouth 2 (two) times daily with a meal.   MUCINEX FOR KIDS 100 MG Pack Generic drug:  Guaifenesin Take 600 mg by mouth every 12 (twelve) hours.   OCUSOFT LID SCRUB Pads Place 1 each into both eyes 2 (two) times daily.   omeprazole 20 MG capsule Commonly known as:  PRILOSEC Take 20 mg by mouth daily before breakfast.   OXYGEN Inhale 2 L/min into the lungs continuous.   predniSONE 20 MG tablet Commonly known as:  DELTASONE Take 2 tablets (40 mg total) by mouth daily. Started 01/30 for 5 days What changed:  additional instructions   saccharomyces boulardii 250 MG capsule Commonly known as:  FLORASTOR Take 250 mg by mouth daily. Started 01/17 for 28 days       No Known Allergies  Consultations:  Neurology   Palliative  Procedures/Studies:  EEG This is an abnormal EEG demonstrating a mild-moderate diffuse slowing of electrocerebral activity.  This can be seen in a wide variety of  encephalopathic state including those of a toxic, metabolic, or degenerative nature.  There were no focal, hemispheric, or lateralizing features.  No epileptiform activity was recorded.  Dg Chest 2 View  Result Date: 02/02/2016 CLINICAL DATA:  Elevated white blood cell count EXAM: CHEST  2 VIEW COMPARISON:  02/02/2013 FINDINGS: Cardiac shadow is within normal limits. The lungs are well aerated bilaterally. No focal infiltrate or sizable effusion is seen. No bony abnormality is noted. IMPRESSION: No active cardiopulmonary disease. Electronically Signed   By: Alcide Clever M.D.   On: 02/02/2016 13:04   Ct Head Wo Contrast  Result Date: 02/04/2016 CLINICAL DATA:  Altered mental status.  Dementia. EXAM: CT HEAD WITHOUT CONTRAST TECHNIQUE: Contiguous axial images were obtained from the base of the skull through the vertex without intravenous contrast. COMPARISON:  CT head 05/18/2015 FINDINGS: Brain: Moderate to advanced atrophy unchanged. Hypodensity throughout the cerebral white matter is unchanged. Chronic infarct left pons New area of hypodensity left posterior temporal and inferior occipital lobe compatible with subacute infarction. Negative for hemorrhage. Negative for mass lesion. Vascular: No hyperdense vessel or unexpected calcification. Skull: Negative Sinuses/Orbits: Complete opacification of the right frontal sinus as noted previously. Mucosal edema  in the right ethmoid sinus. No orbital lesion. Other: None IMPRESSION: New area of infarct in the left posterior artery territory involving the posterior temporal and occipital lobe. This is most consistent with subacute infarct. No hemorrhage Extensive atrophy and chronic microvascular ischemic changes. These results will be called to the ordering clinician or representative by the Radiologist Assistant, and communication documented in the PACS or zVision Dashboard. Electronically Signed   By: Marlan Palau M.D.   On: 02/04/2016 13:04   Dg Swallowing  Func-speech Pathology  Result Date: 02/03/2016 Objective Swallowing Evaluation: Type of Study: MBS-Modified Barium Swallow Study Patient Details Name: Jonathan [name] MRN: [id number] Date of Birth: Mar 23, 1940 Today's Date: 02/03/2016 Time: SLP Start Time (ACUTE ONLY): 1015-SLP Stop Time (ACUTE ONLY): 1030 SLP Time Calculation (min) (ACUTE ONLY): 15 min Past Medical History: Past Medical History: Diagnosis Date . Anemia, iron deficiency 06/02/10  heme negative, ferritin 5 . Asthma  . B12 deficiency 06/02/10  174 . Blood transfusion  . CAD (coronary artery disease)   cath 2001- one vessel occluded (circ); others patent.  EF 55% . Chronic bronchitis  . Dementia 10/19/2012 . Diabetes mellitus  . Diverticulosis  . ED (erectile dysfunction)  . Emphysema of lung (HCC)  . Gastritis 05/08/2010  EGD . GERD (gastroesophageal reflux disease)  . H. pylori infection 05/08/2010 . Hiatal hernia 05/08/2010  EGD . Hyperlipidemia  . Hypertension  . Nephrolithiasis  . Obesity  . Pancreatitis  . Smoker  . Vitamin D deficiency  Past Surgical History: Past Surgical History: Procedure Laterality Date . CARDIAC CATHETERIZATION  01/19/99  showed blockage on"back side"   no stent . COLONOSCOPY  2005, 06/07/10  mild diverticulosis . KNEE CARTILAGE SURGERY   . UPPER GASTROINTESTINAL ENDOSCOPY  06/07/10  2 cm hiatal hernia, mild gastritis HPI: Jacquise P Reddickis a 75 y.o.malewith a history of COPD and advanced dementia, GERD, hiatal hernia,  who presents from SNF for fever and cough. Dx with PNA with primary concern for aspiration PNA due to recurrent fever in the absence of cutaneous or urinary source. CXR negative.  No Data Recorded Assessment / Plan / Recommendation CHL IP CLINICAL IMPRESSIONS 02/03/2016 Therapy Diagnosis Moderate pharyngeal phase dysphagia;Moderate oral phase dysphagia Clinical Impression Patient presents with moderate oropharyngeal dysphagia, likely exacerbated by lethargy. Oral phase delayed, requiring max tactile cues for oral  transit of bolus. Swallow eventually initiated, leaving moderate oral and moderate-severe pharyngeal residuals post swallow due to base of tongue and laryngeal weakness. Attempted empty spoon presentation to elicit dry swallow without success. Trace silent penetration of nectar thick and thin liquids noted but with decreased residuals. Note that oral awareness and transit of bolus more impaired than during bedside testing this am due to increased fatigue. Suspect that swallowing function waxes and wanes with mentation at baseline. Discussed results with spouse. Decision made to continue current diet with SLP f/u. Wife does not wish for feeding tube placement and understands aspiration risk. If mentation improves, would consider repeat MBS to establish baseline prior to d/c back to SNF.  Impact on safety and function Severe aspiration risk   CHL IP TREATMENT RECOMMENDATION 02/03/2016 Treatment Recommendations Therapy as outlined in treatment plan below   Prognosis 02/03/2016 Prognosis for Safe Diet Advancement Guarded Barriers to Reach Goals Cognitive deficits;Time post onset Barriers/Prognosis Comment -- CHL IP DIET RECOMMENDATION 02/03/2016 SLP Diet Recommendations Dysphagia 1 (Puree) solids;Nectar thick liquid Liquid Administration via Spoon Medication Administration Crushed with puree Compensations Slow rate;Small sips/bites Postural Changes Seated upright at 90 degrees  CHL IP OTHER RECOMMENDATIONS 02/03/2016 Recommended Consults -- Oral Care Recommendations Oral care BID Other Recommendations Order thickener from pharmacy;Prohibited food (jello, ice cream, thin soups);Remove water pitcher   CHL IP FOLLOW UP RECOMMENDATIONS 02/03/2016 Follow up Recommendations Skilled Nursing facility   [facility] IP FREQUENCY AND DURATION 02/03/2016 Speech Therapy Frequency (ACUTE ONLY) min 2x/week Treatment Duration 2 weeks      CHL IP ORAL PHASE 02/03/2016 Oral Phase Impaired Oral - Pudding Teaspoon -- Oral - Pudding Cup -- Oral - Honey  Teaspoon Left anterior bolus loss;Impaired mastication;Weak lingual manipulation;Incomplete tongue to palate contact;Reduced posterior propulsion;Holding of bolus;Right pocketing in lateral sulci;Left pocketing in lateral sulci;Pocketing in anterior sulcus;Lingual/palatal residue;Delayed oral transit;Decreased bolus cohesion Oral - Honey Cup -- Oral - Nectar Teaspoon Left anterior bolus loss;Impaired mastication;Weak lingual manipulation;Incomplete tongue to palate contact;Reduced posterior propulsion;Holding of bolus;Right pocketing in lateral sulci;Left pocketing in lateral sulci;Pocketing in anterior sulcus;Lingual/palatal residue;Delayed oral transit;Decreased bolus cohesion Oral - Nectar Cup -- Oral - Nectar Straw -- Oral - Thin Teaspoon Left anterior bolus loss;Impaired mastication;Weak lingual manipulation;Incomplete tongue to palate contact;Reduced posterior propulsion;Holding of bolus;Right pocketing in lateral sulci;Left pocketing in lateral sulci;Pocketing in anterior sulcus;Lingual/palatal residue;Delayed oral transit;Decreased bolus cohesion Oral - Thin Cup -- Oral - Thin Straw -- Oral - Puree Impaired mastication;Weak lingual manipulation;Incomplete tongue to palate contact;Reduced posterior propulsion;Holding of bolus;Right pocketing in lateral sulci;Left pocketing in lateral sulci;Pocketing in anterior sulcus;Lingual/palatal residue;Delayed oral transit;Decreased bolus cohesion Oral - Mech Soft -- Oral - Regular -- Oral - Multi-Consistency -- Oral - Pill -- Oral Phase - Comment --  CHL IP PHARYNGEAL PHASE 02/03/2016 Pharyngeal Phase Impaired Pharyngeal- Pudding Teaspoon -- Pharyngeal -- Pharyngeal- Pudding Cup -- Pharyngeal -- Pharyngeal- Honey Teaspoon Delayed swallow initiation-vallecula;Pharyngeal residue - valleculae;Pharyngeal residue - pyriform;Reduced anterior laryngeal mobility;Reduced laryngeal elevation;Reduced tongue base retraction;Pharyngeal residue - posterior pharnyx Pharyngeal --  Pharyngeal- Honey Cup -- Pharyngeal -- Pharyngeal- Nectar Teaspoon Delayed swallow initiation-vallecula;Pharyngeal residue - valleculae;Pharyngeal residue - pyriform;Reduced anterior laryngeal mobility;Reduced laryngeal elevation;Reduced tongue base retraction;Pharyngeal residue - posterior pharnyx Pharyngeal Material enters airway, remains ABOVE vocal cords and not ejected out Pharyngeal- Nectar Cup -- Pharyngeal -- Pharyngeal- Nectar Straw -- Pharyngeal -- Pharyngeal- Thin Teaspoon Delayed swallow initiation-vallecula;Pharyngeal residue - valleculae;Pharyngeal residue - pyriform;Reduced anterior laryngeal mobility;Reduced laryngeal elevation;Reduced tongue base retraction;Pharyngeal residue - posterior pharnyx;Penetration/Aspiration during swallow Pharyngeal Material enters airway, remains ABOVE vocal cords and not ejected out Pharyngeal- Thin Cup -- Pharyngeal -- Pharyngeal- Thin Straw -- Pharyngeal -- Pharyngeal- Puree Delayed swallow initiation-vallecula;Pharyngeal residue - valleculae;Pharyngeal residue - pyriform;Reduced anterior laryngeal mobility;Reduced laryngeal elevation;Reduced tongue base retraction;Pharyngeal residue - posterior pharnyx Pharyngeal -- Pharyngeal- Mechanical Soft -- Pharyngeal -- Pharyngeal- Regular -- Pharyngeal -- Pharyngeal- Multi-consistency -- Pharyngeal -- Pharyngeal- Pill -- Pharyngeal -- Pharyngeal Comment --  Ferdinand Lango MA, CCC-SLP [phone] McCoy Leah Meryl 02/03/2016, 11:24 AM                 Subjective: - non verbal  Discharge Exam: Vitals:   02/06/16 0100 02/06/16 0537  BP: 138/73 (!) 131/50  Pulse: 73 71  Resp: 20 20  Temp: 98 F (36.7 C) 97.7 F (36.5 C)   Vitals:   02/05/16 2009 02/05/16 2100 02/06/16 0100 02/06/16 0537  BP:  (!) 112/50 138/73 (!) 131/50  Pulse:  79 73 71  Resp:  20 20 20   Temp:  98.7 F (37.1 C) 98 F (36.7 C) 97.7 F (36.5 C)  TempSrc:  Axillary Oral Oral  SpO2: 97% 98% 100% 100%  Weight:  Height:        General:  Pt is alert, awake, not in acute distress Cardiovascular: RRR, S1/S2 +, no rubs, no gallops Respiratory: coarse breath sounds bilaterally    The results of significant diagnostics from this hospitalization (including imaging, microbiology, ancillary and laboratory) are listed below for reference.     Microbiology: Recent Results (from the past 240 hour(s))  Urine culture     Status: None   Collection Time: 02/02/16 12:40 PM  Result Value Ref Range Status   Specimen Description URINE, CLEAN CATCH  Final   Special Requests NONE  Final   Culture   Final    NO GROWTH Performed at [facility], [address] [place], [place] [address]    Report Status 02/04/2016 FINAL  Final  Blood Culture (routine x 2)     Status: None (Preliminary result)   Collection Time: 02/02/16  1:54 PM  Result Value Ref Range Status   Specimen Description BLOOD BLOOD LEFT FOREARM  Final   Special Requests BOTTLES DRAWN AEROBIC AND ANAEROBIC 5 CC  Final   Culture   Final    NO GROWTH 3 DAYS Performed at [facility], [address] [place], [place] [address]    Report Status PENDING  Incomplete  Blood Culture (routine x 2)     Status: None (Preliminary result)   Collection Time: 02/02/16  2:05 PM  Result Value Ref Range Status   Specimen Description BLOOD BLOOD RIGHT HAND  Final   Special Requests BOTTLES DRAWN AEROBIC AND ANAEROBIC 5 CC  Final   Culture   Final    NO GROWTH 3 DAYS Performed at [facility], [address] [place], [place] [address]    Report Status PENDING  Incomplete  MRSA PCR Screening     Status: None   Collection Time: 02/02/16  6:20 PM  Result Value Ref Range Status   MRSA by PCR NEGATIVE NEGATIVE Final    Comment:        The GeneXpert MRSA Assay (FDA approved for NASAL specimens only), is one component of a comprehensive MRSA colonization surveillance program. It is not intended to diagnose MRSA infection nor to guide or monitor treatment for MRSA  infections.      Labs: BNP (last 3 results) No results for input(s): BNP in the last 8760 hours. Basic Metabolic Panel:  Recent Labs Lab 02/03/16 1322 02/03/16 2112 02/04/16 0137 02/04/16 0725 02/04/16 1322  NA 151* 150* 149* 152* 151*  K 4.0 3.6 3.0* 3.0* 4.1  CL 123* 119* 119* 119* 121*  CO2 25 25 25 26 24   GLUCOSE 265* 307* 235* 156* 282*  BUN 31* 32* 30* 29* 30*  CREATININE 1.05 0.94 0.95 0.98 1.23  CALCIUM 8.2* 8.4* 8.3* 8.3* 8.5*   Liver Function Tests:  Recent Labs Lab 02/02/16 1312  AST 17  ALT 18  ALKPHOS 95  BILITOT 0.3  PROT 6.5  ALBUMIN 2.8*   No results for input(s): LIPASE, AMYLASE in the last 168 hours.  Recent Labs Lab 02/04/16 1046  AMMONIA 58*   CBC:  Recent Labs Lab 02/02/16 1312 02/02/16 1959 02/03/16 0415  WBC 17.0* 15.2* 12.3*  NEUTROABS 14.2*  --   --   HGB 12.7* 11.9* 10.8*  HCT 42.0 40.1 36.5*  MCV 96.3 98.5 97.9  PLT 315 283 280   Cardiac Enzymes: No results for input(s): CKTOTAL, CKMB, CKMBINDEX, TROPONINI in the last 168 hours. BNP: Invalid input(s): POCBNP CBG:  Recent Labs Lab 02/05/16 1637 02/05/16 2044 02/05/16 2333 02/06/16 0411 02/06/16 0750  GLUCAP 178* 228* 214* 113* 157*   D-Dimer No results for input(s): DDIMER in the last 72 hours. Hgb A1c No results for input(s): HGBA1C in the last 72 hours. Lipid Profile No results for input(s): CHOL, HDL, LDLCALC, TRIG, CHOLHDL, LDLDIRECT in the last 72 hours. Thyroid function studies  Recent Labs  02/04/16 1046  TSH 0.278*   Anemia work up No results for input(s): VITAMINB12, FOLATE, FERRITIN, TIBC, IRON, RETICCTPCT in the last 72 hours. Urinalysis    Component Value Date/Time   COLORURINE YELLOW 02/02/2016 1246   APPEARANCEUR CLEAR 02/02/2016 1246   LABSPEC 1.021 02/02/2016 1246   PHURINE 5.0 02/02/2016 1246   GLUCOSEU >=500 (A) 02/02/2016 1246   HGBUR NEGATIVE 02/02/2016 1246   BILIRUBINUR NEGATIVE 02/02/2016 1246   KETONESUR NEGATIVE  02/02/2016 1246   PROTEINUR NEGATIVE 02/02/2016 1246   UROBILINOGEN 0.2 09/27/2014 0610   NITRITE NEGATIVE 02/02/2016 1246   LEUKOCYTESUR NEGATIVE 02/02/2016 1246   Sepsis Labs Invalid input(s): PROCALCITONIN,  WBC,  LACTICIDVEN Microbiology Recent Results (from the past 240 hour(s))  Urine culture     Status: None   Collection Time: 02/02/16 12:40 PM  Result Value Ref Range Status   Specimen Description URINE, CLEAN CATCH  Final   Special Requests NONE  Final   Culture   Final    NO GROWTH Performed at [facility], [address], [place], [place] [address]    Report Status 02/04/2016 FINAL  Final  Blood Culture (routine x 2)     Status: None (Preliminary result)   Collection Time: 02/02/16  1:54 PM  Result Value Ref Range Status   Specimen Description BLOOD BLOOD LEFT FOREARM  Final   Special Requests BOTTLES DRAWN AEROBIC AND ANAEROBIC 5 CC  Final   Culture   Final    NO GROWTH 3 DAYS Performed at [facility], [address] [place], [place] [address]    Report Status PENDING  Incomplete  Blood Culture (routine x 2)     Status: None (Preliminary result)   Collection Time: 02/02/16  2:05 PM  Result Value Ref Range Status   Specimen Description BLOOD BLOOD RIGHT HAND  Final   Special Requests BOTTLES DRAWN AEROBIC AND ANAEROBIC 5 CC  Final   Culture   Final    NO GROWTH 3 DAYS Performed at [facility], [address] [place], [place] [address]    Report Status PENDING  Incomplete  MRSA PCR Screening     Status: None   Collection Time: 02/02/16  6:20 PM  Result Value Ref Range Status   MRSA by PCR NEGATIVE NEGATIVE Final    Comment:        The GeneXpert MRSA Assay (FDA approved for NASAL specimens only), is one component of a comprehensive MRSA colonization surveillance program. It is not intended to diagnose MRSA infection nor to guide or monitor treatment for MRSA infections.      Time coordinating discharge: Over 30  minutes  SIGNED:  Pamella Pert, MD  [facility] 02/06/2016, 8:23 AM Pager [phone]  If 7PM-7AM, please contact night-coverage [email or web address] Password [email or web address]

## 2016-02-06 NOTE — Care Management Note (Signed)
Case Management Note  Patient Details  Name: Jonathan [name] MRN: [id number] Date of Birth: Mar 14, 1940  Subjective/Objective:                    Action/Plan: [facility] following. No further needs per CM.   Expected Discharge Date:  02/06/16               Expected Discharge Plan:  Assisted Living / Rest Home  In-House Referral:  Clinical Social Work  Discharge planning Services     Post Acute Care Choice:    Choice offered to:     DME Arranged:    DME Agency:     HH Arranged:    HH Agency:     Status of Service:  Completed, signed off  If discussed at [employer] Meetings, dates discussed:    Additional Comments:  Kermit BaloKelli F Kenroy Timberman, RN 02/06/2016, 12:20 PM

## 2016-02-06 NOTE — Clinical Social Work Note (Signed)
Clinical Social Work Assessment  Patient Details  Name: Jonathan [name] MRN: [id number] Date of Birth: Nov 12, 1940  Date of referral:  02/06/16               Reason for consult:  Facility Placement, Discharge Planning                Permission sought to share information with:  Other Permission granted to share information::  Yes, Verbal Permission Granted  Name::     Jonathan [name]  Relationship::  wife  Contact Information:  [phone]  Housing/Transportation Living arrangements for the past 2 months:  [place] of Information:  Spouse Patient Interpreter Needed:  None Criminal Activity/Legal Involvement Pertinent to Current Situation/Hospitalization:  No - Comment as needed Significant Relationships:  Spouse Lives with:  Facility Resident Do you feel safe going back to the place where you live?  Yes Need for family participation in patient care:  Yes (Comment)  Care giving concerns:  No care giving concerns identified.   Social Worker assessment / plan:  CSW met with pt and wife to address consult as pt was admitted from [facility]. Pt is ready for discharge. Pt is alert, however no oriented. CSW introduced herself and explained role of social work. CSW also explained the process of returning to SNF with Hospice Services. Pt is LTC at facility and will return to same room. Pt's wife is agreeable to discharge plan. Pt's wife shared that she will be following up with facility about Hospice follow up. RN will call report. [facility] will provide transportation. CSW is signing off as no further needs identified.   Employment status:  Retired Nurse, adult PT Recommendations:  Not assessed at this time Information / Referral to community resources:  [place]  Patient/Family's Response to care:  Pt's wife was Patent attorney of CSW support.   Patient/Family's Understanding of and Emotional Response to Diagnosis, Current  Treatment, and Prognosis:  Pt's wife understands that pt will return to facility for continued care.   Emotional Assessment Appearance:  Appears stated age Attitude/Demeanor/Rapport:  Other (Alert, but not oriented) Affect (typically observed):  Other (Alert, but not oriented) Orientation:   (Not oriented) Alcohol / Substance use:  Not Applicable Psych involvement (Current and /or in the community):  No (Comment)  Discharge Needs  Concerns to be addressed:  No discharge needs identified Readmission within the last 30 days:  No Current discharge risk:  Chronically ill Barriers to Discharge:  No Barriers Identified   Darden Dates, LCSW 02/06/2016, 11:57 AM

## 2016-02-07 DIAGNOSIS — F0151 Vascular dementia with behavioral disturbance: Secondary | ICD-10-CM

## 2016-02-07 LAB — CULTURE, BLOOD (ROUTINE X 2)
Culture: NO GROWTH
Culture: NO GROWTH

## 2016-02-07 LAB — GLUCOSE, CAPILLARY
GLUCOSE-CAPILLARY: 106 mg/dL — AB (ref 65–99)
Glucose-Capillary: 151 mg/dL — ABNORMAL HIGH (ref 65–99)
Glucose-Capillary: 109 mg/dL — ABNORMAL HIGH (ref 65–99)
Glucose-Capillary: 159 mg/dL — ABNORMAL HIGH (ref 65–99)

## 2016-02-07 MED ORDER — MORPHINE SULFATE (CONCENTRATE) 10 MG /0.5 ML PO SOLN: 10.0000 mg | ORAL | 0 refills | Status: AC | PRN

## 2016-02-07 NOTE — Discharge Summary (Signed)
Physician Discharge Summary  Jonathan [name] ZDG:[id number] DOB: [birth year] DOA: 02/02/2016  PCP: Colon Branch, MD  Admit date: 02/02/2016 Discharge date: 02/07/2016  Admitted From: SNF Disposition:  Residential Hospice  Recommendations for Outpatient Follow-up:  1. Follow up with Hospice as needed  Discharge Condition:Stable CODE STATUS:DNR Diet recommendation: NPO   Brief/Interim Summary: 76 y.o. male with a history of COPD and dementia. Patient presented to the emergency department with confusion. Workup for possible infection underwent since patient presented with sepsis. Patient was found to have a subacute CVA and was transferred to [facility] for Neurology consult. He was about to be discharged to SNF with hospice on 1/30 when he started desatting likely aspiration after lunch.  Aspiration Pneumonia vs Pneumonitis with Sepsis - Resolved after IV boluses and initiation of antibiotics. Source is likely aspiration pneumonia vs urinary infection. Cultures pending. - blood cultures NGTD, urine culture negative - was placed on Zosyn, transitioned to Augmentin prior to d/c however aspirated and has remained NPO.  Aspiration pneumonia / dysphagia / acute hypoxic respiratory failure - new onset 1/30, cancel d/c to SNF. D/w wife at bedside. He is DNR/DNI, will hold d/c and attempt suctioning, supplemental oxygen, supportive treatment - no ICU, no escalation of care - if he doesn't recover anticipate in-hospital death or residential hospice at best, discussed with wife he may have hours to days but appears less than 2 weeks. - Discussed case with wife at bedside. Given high risk of re-aspiration, family has elected to strict NPO status and for transition to comfort with hospice. Have consulted residential hospice  Subacute stroke - neuro consulted, no aggressive workup, d/c with hospice planned for 1/30, however new aspiration event  Acute on chronic respiratory failure with hypoxia -  Patient has baseline COPD.   COPD - continue Brovana, Duonebs and albuterol prn  Hypernatremia - due to poor po intake  Diabetes mellitus - continued SSI while inpatient - continue to hold home metformin  GERD - Continue PPI  Advanced dementia - Patient has waxing and waning altered mental status at baseline. Wife thinks it has possibly lasted longer than usual. Respiratory effort normal. - continued Aricept while admitted  Altered mental status - initially improved, however overall has shown gradual decline  Essential hypertension - continue to hold amlodipine  Discharge Diagnoses:  Active Problems:   DM (diabetes mellitus), secondary, uncontrolled, with peripheral vascular complications (HCC)   GERD (gastroesophageal reflux disease)   Dementia with behavioral disturbance   Pneumonia   Hypernatremia   Altered mental status   Cerebral thrombosis with cerebral infarction   Healthcare-associated pneumonia    Discharge Instructions   Allergies as of 02/07/2016   No Known Allergies     Medication List    STOP taking these medications   amLODipine 10 MG tablet Commonly known as:  NORVASC   aspirin 81 MG chewable tablet   CERTAVITE/ANTIOXIDANTS PO   cyanocobalamin 1000 MCG/ML injection Commonly known as:  (VITAMIN B-12)   donepezil 10 MG tablet Commonly known as:  ARICEPT   ergocalciferol 50000 units capsule Commonly known as:  VITAMIN D2   levofloxacin 500 MG/100ML Soln Commonly known as:  LEVAQUIN   metFORMIN 1000 MG tablet Commonly known as:  GLUCOPHAGE   metFORMIN 500 MG tablet Commonly known as:  GLUCOPHAGE   MUCINEX FOR KIDS 100 MG Pack Generic drug:  Guaifenesin   omeprazole 20 MG capsule Commonly known as:  PRILOSEC   predniSONE 20 MG tablet Commonly known as:  DELTASONE  saccharomyces boulardii 250 MG capsule Commonly known as:  FLORASTOR     TAKE these medications   albuterol (2.5 MG/3ML) 0.083% nebulizer  solution Commonly known as:  PROVENTIL Take 3 mLs by nebulization every 4 (four) hours as needed for shortness of breath or wheezing.   BROVANA 15 MCG/2ML Nebu Generic drug:  arformoterol Take 1 vial by nebulization 2 (two) times daily.   cycloSPORINE 0.05 % ophthalmic emulsion Commonly known as:  RESTASIS Place 1 drop into both eyes 2 (two) times daily.   ipratropium-albuterol 0.5-2.5 (3) MG/3ML Soln Commonly known as:  DUONEB Take 3 mLs by nebulization every 8 (eight) hours.   morphine CONCENTRATE 10 mg / 0.5 ml concentrated solution Take 0.5 mLs (10 mg total) by mouth every 3 (three) hours as needed for severe pain, anxiety or shortness of breath.   OCUSOFT LID SCRUB Pads Place 1 each into both eyes 2 (two) times daily.   OXYGEN Inhale 2 L/min into the lungs continuous.       No Known Allergies  Consultations:  Neurology  Procedures/Studies: Dg Chest 2 View  Result Date: 02/02/2016 CLINICAL DATA:  Elevated white blood cell count EXAM: CHEST  2 VIEW COMPARISON:  02/02/2013 FINDINGS: Cardiac shadow is within normal limits. The lungs are well aerated bilaterally. No focal infiltrate or sizable effusion is seen. No bony abnormality is noted. IMPRESSION: No active cardiopulmonary disease. Electronically Signed   By: Alcide Clever M.D.   On: 02/02/2016 13:04   Ct Head Wo Contrast  Result Date: 02/04/2016 CLINICAL DATA:  Altered mental status.  Dementia. EXAM: CT HEAD WITHOUT CONTRAST TECHNIQUE: Contiguous axial images were obtained from the base of the skull through the vertex without intravenous contrast. COMPARISON:  CT head 05/18/2015 FINDINGS: Brain: Moderate to advanced atrophy unchanged. Hypodensity throughout the cerebral white matter is unchanged. Chronic infarct left pons New area of hypodensity left posterior temporal and inferior occipital lobe compatible with subacute infarction. Negative for hemorrhage. Negative for mass lesion. Vascular: No hyperdense vessel or  unexpected calcification. Skull: Negative Sinuses/Orbits: Complete opacification of the right frontal sinus as noted previously. Mucosal edema in the right ethmoid sinus. No orbital lesion. Other: None IMPRESSION: New area of infarct in the left posterior artery territory involving the posterior temporal and occipital lobe. This is most consistent with subacute infarct. No hemorrhage Extensive atrophy and chronic microvascular ischemic changes. These results will be called to the ordering clinician or representative by the Radiologist Assistant, and communication documented in the PACS or zVision Dashboard. Electronically Signed   By: Marlan Palau M.D.   On: 02/04/2016 13:04   Dg Swallowing Func-speech Pathology  Result Date: 02/03/2016 Objective Swallowing Evaluation: Type of Study: MBS-Modified Barium Swallow Study Patient Details Name: Jonathan [name] MRN: [id number] Date of Birth: 19-Aug-1940 Today's Date: 02/03/2016 Time: SLP Start Time (ACUTE ONLY): 1015-SLP Stop Time (ACUTE ONLY): 1030 SLP Time Calculation (min) (ACUTE ONLY): 15 min Past Medical History: Past Medical History: Diagnosis Date . Anemia, iron deficiency 06/02/10  heme negative, ferritin 5 . Asthma  . B12 deficiency 06/02/10  174 . Blood transfusion  . CAD (coronary artery disease)   cath 2001- one vessel occluded (circ); others patent.  EF 55% . Chronic bronchitis  . Dementia 10/19/2012 . Diabetes mellitus  . Diverticulosis  . ED (erectile dysfunction)  . Emphysema of lung (HCC)  . Gastritis 05/08/2010  EGD . GERD (gastroesophageal reflux disease)  . H. pylori infection 05/08/2010 . Hiatal hernia 05/08/2010  EGD . Hyperlipidemia  .  Hypertension  . Nephrolithiasis  . Obesity  . Pancreatitis  . Smoker  . Vitamin D deficiency  Past Surgical History: Past Surgical History: Procedure Laterality Date . CARDIAC CATHETERIZATION  01/19/99  showed blockage on"back side"   no stent . COLONOSCOPY  2005, 06/07/10  mild diverticulosis . KNEE CARTILAGE SURGERY   .  UPPER GASTROINTESTINAL ENDOSCOPY  06/07/10  2 cm hiatal hernia, mild gastritis HPI: Jonathan P Reddickis a 75 y.o.malewith a history of COPD and advanced dementia, GERD, hiatal hernia,  who presents from SNF for fever and cough. Dx with PNA with primary concern for aspiration PNA due to recurrent fever in the absence of cutaneous or urinary source. CXR negative.  No Data Recorded Assessment / Plan / Recommendation CHL IP CLINICAL IMPRESSIONS 02/03/2016 Therapy Diagnosis Moderate pharyngeal phase dysphagia;Moderate oral phase dysphagia Clinical Impression Patient presents with moderate oropharyngeal dysphagia, likely exacerbated by lethargy. Oral phase delayed, requiring max tactile cues for oral transit of bolus. Swallow eventually initiated, leaving moderate oral and moderate-severe pharyngeal residuals post swallow due to base of tongue and laryngeal weakness. Attempted empty spoon presentation to elicit dry swallow without success. Trace silent penetration of nectar thick and thin liquids noted but with decreased residuals. Note that oral awareness and transit of bolus more impaired than during bedside testing this am due to increased fatigue. Suspect that swallowing function waxes and wanes with mentation at baseline. Discussed results with spouse. Decision made to continue current diet with SLP f/u. Wife does not wish for feeding tube placement and understands aspiration risk. If mentation improves, would consider repeat MBS to establish baseline prior to d/c back to SNF.  Impact on safety and function Severe aspiration risk   CHL IP TREATMENT RECOMMENDATION 02/03/2016 Treatment Recommendations Therapy as outlined in treatment plan below   Prognosis 02/03/2016 Prognosis for Safe Diet Advancement Guarded Barriers to Reach Goals Cognitive deficits;Time post onset Barriers/Prognosis Comment -- CHL IP DIET RECOMMENDATION 02/03/2016 SLP Diet Recommendations Dysphagia 1 (Puree) solids;Nectar thick liquid Liquid  Administration via Spoon Medication Administration Crushed with puree Compensations Slow rate;Small sips/bites Postural Changes Seated upright at 90 degrees   CHL IP OTHER RECOMMENDATIONS 02/03/2016 Recommended Consults -- Oral Care Recommendations Oral care BID Other Recommendations Order thickener from pharmacy;Prohibited food (jello, ice cream, thin soups);Remove water pitcher   CHL IP FOLLOW UP RECOMMENDATIONS 02/03/2016 Follow up Recommendations Skilled Nursing facility   [facility] IP FREQUENCY AND DURATION 02/03/2016 Speech Therapy Frequency (ACUTE ONLY) min 2x/week Treatment Duration 2 weeks      CHL IP ORAL PHASE 02/03/2016 Oral Phase Impaired Oral - Pudding Teaspoon -- Oral - Pudding Cup -- Oral - Honey Teaspoon Left anterior bolus loss;Impaired mastication;Weak lingual manipulation;Incomplete tongue to palate contact;Reduced posterior propulsion;Holding of bolus;Right pocketing in lateral sulci;Left pocketing in lateral sulci;Pocketing in anterior sulcus;Lingual/palatal residue;Delayed oral transit;Decreased bolus cohesion Oral - Honey Cup -- Oral - Nectar Teaspoon Left anterior bolus loss;Impaired mastication;Weak lingual manipulation;Incomplete tongue to palate contact;Reduced posterior propulsion;Holding of bolus;Right pocketing in lateral sulci;Left pocketing in lateral sulci;Pocketing in anterior sulcus;Lingual/palatal residue;Delayed oral transit;Decreased bolus cohesion Oral - Nectar Cup -- Oral - Nectar Straw -- Oral - Thin Teaspoon Left anterior bolus loss;Impaired mastication;Weak lingual manipulation;Incomplete tongue to palate contact;Reduced posterior propulsion;Holding of bolus;Right pocketing in lateral sulci;Left pocketing in lateral sulci;Pocketing in anterior sulcus;Lingual/palatal residue;Delayed oral transit;Decreased bolus cohesion Oral - Thin Cup -- Oral - Thin Straw -- Oral - Puree Impaired mastication;Weak lingual manipulation;Incomplete tongue to palate contact;Reduced posterior  propulsion;Holding of bolus;Right pocketing in lateral sulci;Left pocketing  in lateral sulci;Pocketing in anterior sulcus;Lingual/palatal residue;Delayed oral transit;Decreased bolus cohesion Oral - Mech Soft -- Oral - Regular -- Oral - Multi-Consistency -- Oral - Pill -- Oral Phase - Comment --  CHL IP PHARYNGEAL PHASE 02/03/2016 Pharyngeal Phase Impaired Pharyngeal- Pudding Teaspoon -- Pharyngeal -- Pharyngeal- Pudding Cup -- Pharyngeal -- Pharyngeal- Honey Teaspoon Delayed swallow initiation-vallecula;Pharyngeal residue - valleculae;Pharyngeal residue - pyriform;Reduced anterior laryngeal mobility;Reduced laryngeal elevation;Reduced tongue base retraction;Pharyngeal residue - posterior pharnyx Pharyngeal -- Pharyngeal- Honey Cup -- Pharyngeal -- Pharyngeal- Nectar Teaspoon Delayed swallow initiation-vallecula;Pharyngeal residue - valleculae;Pharyngeal residue - pyriform;Reduced anterior laryngeal mobility;Reduced laryngeal elevation;Reduced tongue base retraction;Pharyngeal residue - posterior pharnyx Pharyngeal Material enters airway, remains ABOVE vocal cords and not ejected out Pharyngeal- Nectar Cup -- Pharyngeal -- Pharyngeal- Nectar Straw -- Pharyngeal -- Pharyngeal- Thin Teaspoon Delayed swallow initiation-vallecula;Pharyngeal residue - valleculae;Pharyngeal residue - pyriform;Reduced anterior laryngeal mobility;Reduced laryngeal elevation;Reduced tongue base retraction;Pharyngeal residue - posterior pharnyx;Penetration/Aspiration during swallow Pharyngeal Material enters airway, remains ABOVE vocal cords and not ejected out Pharyngeal- Thin Cup -- Pharyngeal -- Pharyngeal- Thin Straw -- Pharyngeal -- Pharyngeal- Puree Delayed swallow initiation-vallecula;Pharyngeal residue - valleculae;Pharyngeal residue - pyriform;Reduced anterior laryngeal mobility;Reduced laryngeal elevation;Reduced tongue base retraction;Pharyngeal residue - posterior pharnyx Pharyngeal -- Pharyngeal- Mechanical Soft -- Pharyngeal --  Pharyngeal- Regular -- Pharyngeal -- Pharyngeal- Multi-consistency -- Pharyngeal -- Pharyngeal- Pill -- Pharyngeal -- Pharyngeal Comment --  Ferdinand LangoLeah McCoy MA, CCC-SLP [phone] McCoy Leah Meryl 02/03/2016, 11:24 AM               Subjective: Unable to assess given pt's inability to verbalize  Discharge Exam: Vitals:   02/07/16 0100 02/07/16 0500  BP: 123/68 133/66  Pulse: 79 78  Resp: 18 20  Temp: 98.3 F (36.8 C) 98 F (36.7 C)   Vitals:   02/07/16 0100 02/07/16 0500 02/07/16 0903 02/07/16 0908  BP: 123/68 133/66    Pulse: 79 78    Resp: 18 20    Temp: 98.3 F (36.8 C) 98 F (36.7 C)    TempSrc: Axillary Axillary    SpO2: 97% 97% 96% 96%  Weight:      Height:        General: Pt is asleep, not in acute distress Cardiovascular: RRR, S1/S2 +, no rubs, no gallops Respiratory: CTA bilaterally, no wheezing, no rhonchi Abdominal: Soft, NT, ND, bowel sounds + Extremities: no edema, no cyanosis   The results of significant diagnostics from this hospitalization (including imaging, microbiology, ancillary and laboratory) are listed below for reference.     Microbiology: Recent Results (from the past 240 hour(s))  Urine culture     Status: None   Collection Time: 02/02/16 12:40 PM  Result Value Ref Range Status   Specimen Description URINE, CLEAN CATCH  Final   Special Requests NONE  Final   Culture   Final    NO GROWTH Performed at [facility], [address] [place], [place] [address]    Report Status 02/04/2016 FINAL  Final  Blood Culture (routine x 2)     Status: None   Collection Time: 02/02/16  1:54 PM  Result Value Ref Range Status   Specimen Description BLOOD BLOOD LEFT FOREARM  Final   Special Requests BOTTLES DRAWN AEROBIC AND ANAEROBIC 5 CC  Final   Culture   Final    NO GROWTH 5 DAYS Performed at [facility], [address] [place], [place] [address]    Report Status 02/07/2016 FINAL  Final  Blood Culture (routine x 2)  Status: None    Collection Time: 02/02/16  2:05 PM  Result Value Ref Range Status   Specimen Description BLOOD BLOOD RIGHT HAND  Final   Special Requests BOTTLES DRAWN AEROBIC AND ANAEROBIC 5 CC  Final   Culture   Final    NO GROWTH 5 DAYS Performed at [facility], [address] [place], [place] [address]    Report Status 02/07/2016 FINAL  Final  MRSA PCR Screening     Status: None   Collection Time: 02/02/16  6:20 PM  Result Value Ref Range Status   MRSA by PCR NEGATIVE NEGATIVE Final    Comment:        The GeneXpert MRSA Assay (FDA approved for NASAL specimens only), is one component of a comprehensive MRSA colonization surveillance program. It is not intended to diagnose MRSA infection nor to guide or monitor treatment for MRSA infections.      Labs: BNP (last 3 results) No results for input(s): BNP in the last 8760 hours. Basic Metabolic Panel:  Recent Labs Lab 02/03/16 1322 02/03/16 2112 02/04/16 0137 02/04/16 0725 02/04/16 1322  NA 151* 150* 149* 152* 151*  K 4.0 3.6 3.0* 3.0* 4.1  CL 123* 119* 119* 119* 121*  CO2 25 25 25 26 24   GLUCOSE 265* 307* 235* 156* 282*  BUN 31* 32* 30* 29* 30*  CREATININE 1.05 0.94 0.95 0.98 1.23  CALCIUM 8.2* 8.4* 8.3* 8.3* 8.5*   Liver Function Tests:  Recent Labs Lab 02/02/16 1312  AST 17  ALT 18  ALKPHOS 95  BILITOT 0.3  PROT 6.5  ALBUMIN 2.8*   No results for input(s): LIPASE, AMYLASE in the last 168 hours.  Recent Labs Lab 02/04/16 1046  AMMONIA 58*   CBC:  Recent Labs Lab 02/02/16 1312 02/02/16 1959 02/03/16 0415  WBC 17.0* 15.2* 12.3*  NEUTROABS 14.2*  --   --   HGB 12.7* 11.9* 10.8*  HCT 42.0 40.1 36.5*  MCV 96.3 98.5 97.9  PLT 315 283 280   Cardiac Enzymes: No results for input(s): CKTOTAL, CKMB, CKMBINDEX, TROPONINI in the last 168 hours. BNP: Invalid input(s): POCBNP CBG:  Recent Labs Lab 02/06/16 2046 02/07/16 0007 02/07/16 0437 02/07/16 0727 02/07/16 1141  GLUCAP 88 106* 151* 109* 159*    D-Dimer No results for input(s): DDIMER in the last 72 hours. Hgb A1c No results for input(s): HGBA1C in the last 72 hours. Lipid Profile No results for input(s): CHOL, HDL, LDLCALC, TRIG, CHOLHDL, LDLDIRECT in the last 72 hours. Thyroid function studies No results for input(s): TSH, T4TOTAL, T3FREE, THYROIDAB in the last 72 hours.  Invalid input(s): FREET3 Anemia work up No results for input(s): VITAMINB12, FOLATE, FERRITIN, TIBC, IRON, RETICCTPCT in the last 72 hours. Urinalysis    Component Value Date/Time   COLORURINE YELLOW 02/02/2016 1246   APPEARANCEUR CLEAR 02/02/2016 1246   LABSPEC 1.021 02/02/2016 1246   PHURINE 5.0 02/02/2016 1246   GLUCOSEU >=500 (A) 02/02/2016 1246   HGBUR NEGATIVE 02/02/2016 1246   BILIRUBINUR NEGATIVE 02/02/2016 1246   KETONESUR NEGATIVE 02/02/2016 1246   PROTEINUR NEGATIVE 02/02/2016 1246   UROBILINOGEN 0.2 09/27/2014 0610   NITRITE NEGATIVE 02/02/2016 1246   LEUKOCYTESUR NEGATIVE 02/02/2016 1246   Sepsis Labs Invalid input(s): PROCALCITONIN,  WBC,  LACTICIDVEN Microbiology Recent Results (from the past 240 hour(s))  Urine culture     Status: None   Collection Time: 02/02/16 12:40 PM  Result Value Ref Range Status   Specimen Description URINE, CLEAN CATCH  Final  Special Requests NONE  Final   Culture   Final    NO GROWTH Performed at [facility], [address], [place], [place] [address]    Report Status 02/04/2016 FINAL  Final  Blood Culture (routine x 2)     Status: None   Collection Time: 02/02/16  1:54 PM  Result Value Ref Range Status   Specimen Description BLOOD BLOOD LEFT FOREARM  Final   Special Requests BOTTLES DRAWN AEROBIC AND ANAEROBIC 5 CC  Final   Culture   Final    NO GROWTH 5 DAYS Performed at [facility], [address], [place], [place] [address]    Report Status 02/07/2016 FINAL  Final  Blood Culture (routine x 2)     Status: None   Collection Time: 02/02/16  2:05 PM  Result Value Ref Range  Status   Specimen Description BLOOD BLOOD RIGHT HAND  Final   Special Requests BOTTLES DRAWN AEROBIC AND ANAEROBIC 5 CC  Final   Culture   Final    NO GROWTH 5 DAYS Performed at [facility], [address], [place], [place] [address]    Report Status 02/07/2016 FINAL  Final  MRSA PCR Screening     Status: None   Collection Time: 02/02/16  6:20 PM  Result Value Ref Range Status   MRSA by PCR NEGATIVE NEGATIVE Final    Comment:        The GeneXpert MRSA Assay (FDA approved for NASAL specimens only), is one component of a comprehensive MRSA colonization surveillance program. It is not intended to diagnose MRSA infection nor to guide or monitor treatment for MRSA infections.      SIGNED:   Jerald Kief, MD  [facility] 02/07/2016, 4:22 PM  If 7PM-7AM, please contact night-coverage [email or web address] Password [email or web address]

## 2016-02-07 NOTE — Clinical Social Work Note (Signed)
Pt is ready for discharge today and will go to [facility]. Pt's wife is aware and agreeable to discharge plan. All clinicals have been sent to [facility]. Pt will be transported via [facility]. Ambulance form, DNR, and discharge paperwork on chart. RN has called report. CSW is signing off as no further needs identified.    Corlis HoveJeneya Jhene Westmoreland, MSW, [facility]  Clinical Social Worker  [phone]

## 2016-02-07 NOTE — Progress Notes (Signed)
Pt repositioned condom cath intact.

## 2016-03-07 DEATH — deceased

## 2017-02-05 IMAGING — CT CT HEAD W/O CM
5 of 6 series · 14 of 47 positions shown, 16 images · non-contrast
Comparison: CT of the head performed 09/27/2014, and CT of the
cervical spine performed 10/08/2013

CLINICAL DATA: Status post fall, with laceration at the left
eyebrow. Concern for head or cervical spine injury. Initial
encounter.

EXAM:
CT HEAD WITHOUT CONTRAST
CT CERVICAL SPINE WITHOUT CONTRAST
TECHNIQUE: Multidetector CT imaging of the head and cervical spine was
performed following the standard protocol without intravenous
contrast. Multiplanar CT image reconstructions of the cervical spine
were also generated.

[Series 2: headseq 4.8 h37s · axial · 0.49mm/px · z∈[+271,+321]mm · 2 of 30 slices shown]
[im 10/30  brain]
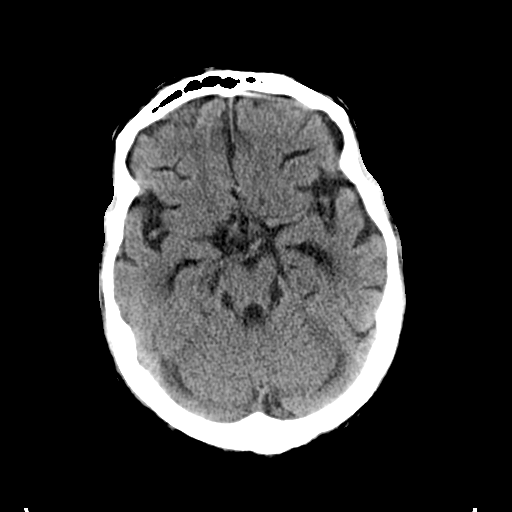
[im 20/30  brain]
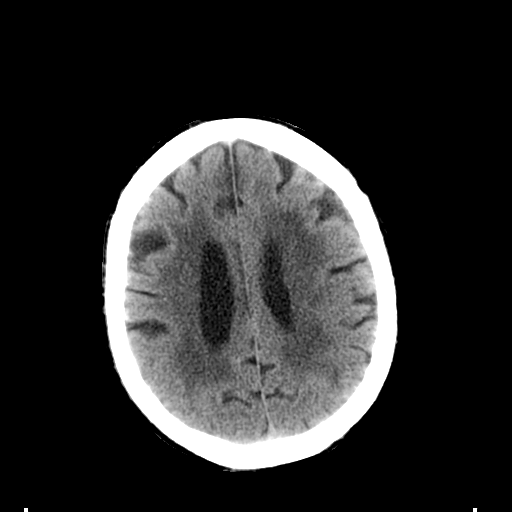

[Series 3: headseq 2.4 h60s · axial · 0.49mm/px · z∈[+248,+347]mm · 5 of 60 slices shown, 7 images]
[im 10/60  brain]
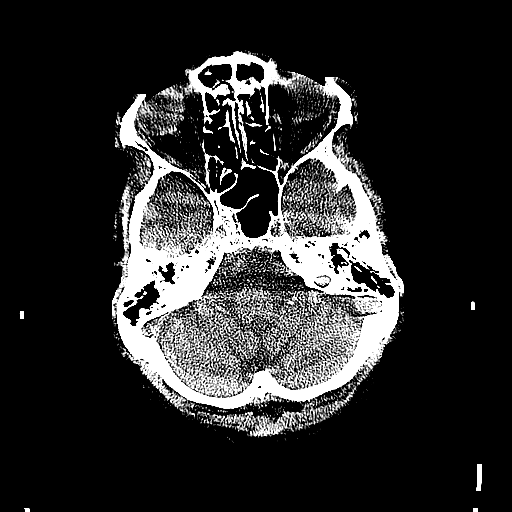
[im 10/60  bone]
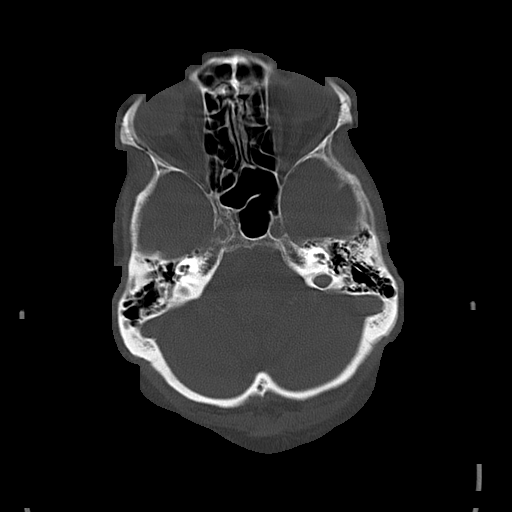
[im 20/60  brain]
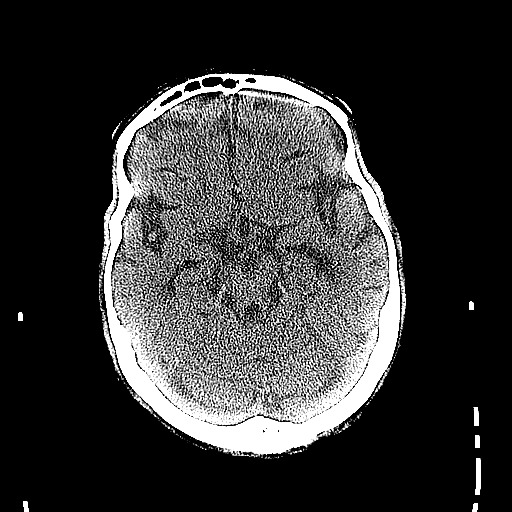
[im 30/60  brain]
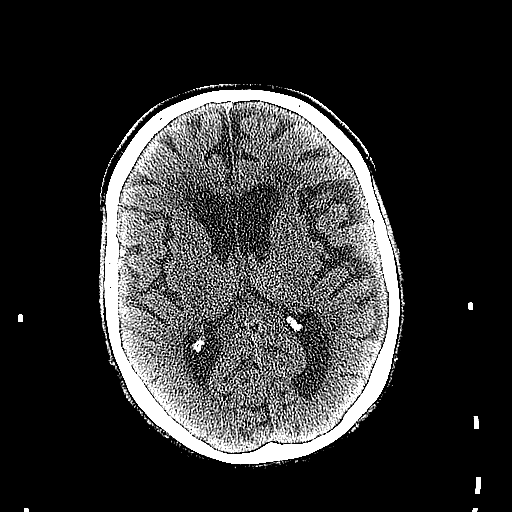
[im 40/60  brain]
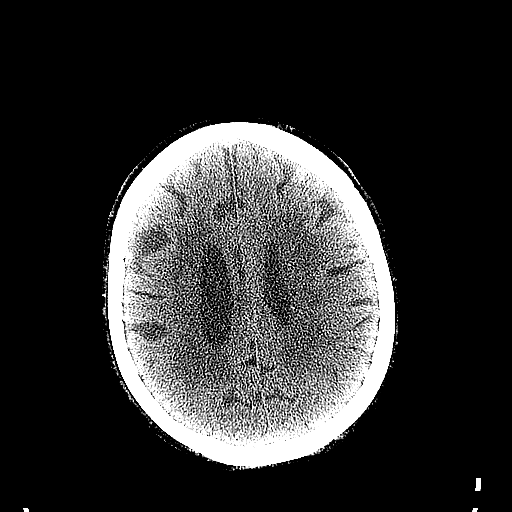
[im 50/60  brain]
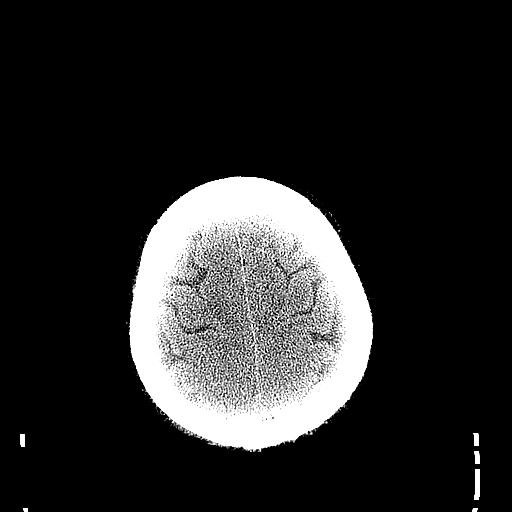
[im 50/60  bone]
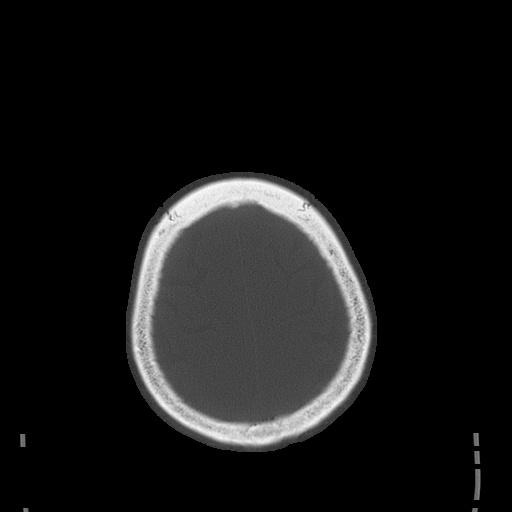

[Series 7: sagittal bone 2.0 · sagittal · 0.29mm/px · 3 of 60 slices shown]
[im 20/60  brain]
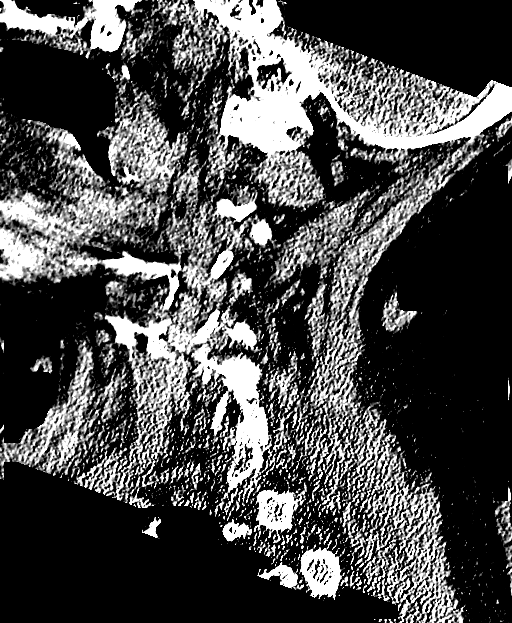
[im 30/60  brain]
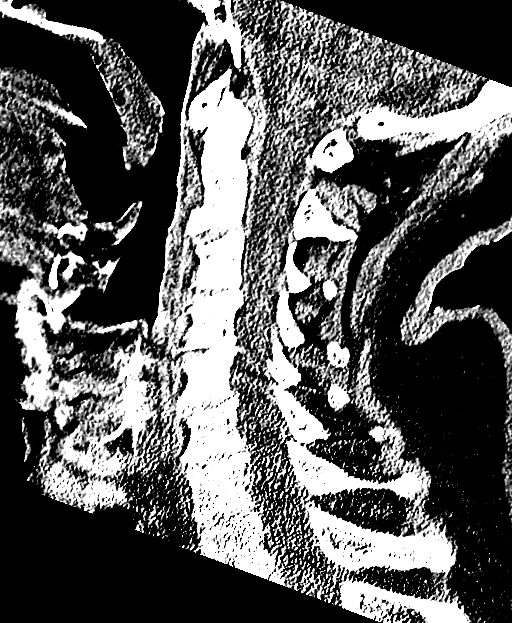
[im 40/60  brain]
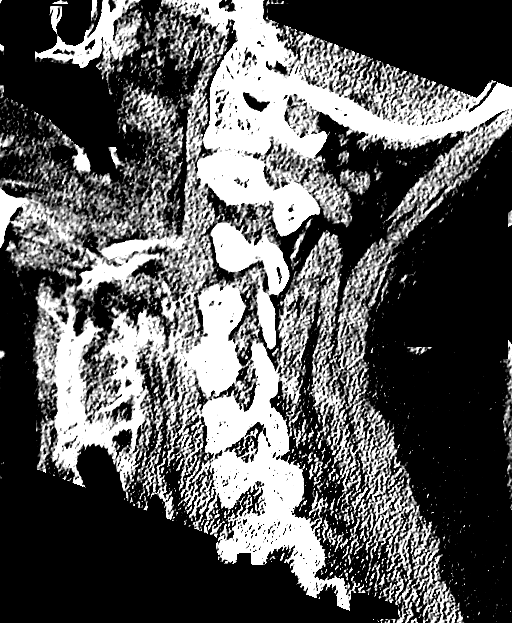

[Series 8: coronal bone 2.0 · coronal · 0.33mm/px · 3 of 58 slices shown]
[im 20/58  brain]
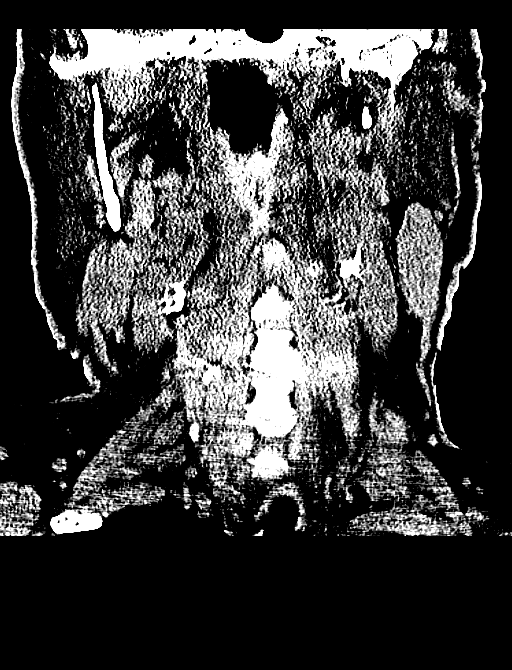
[im 26/58  brain]
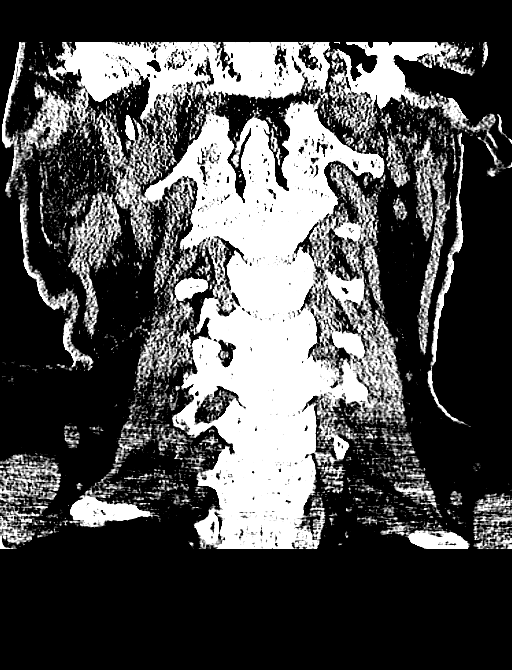
[im 32/58  brain]
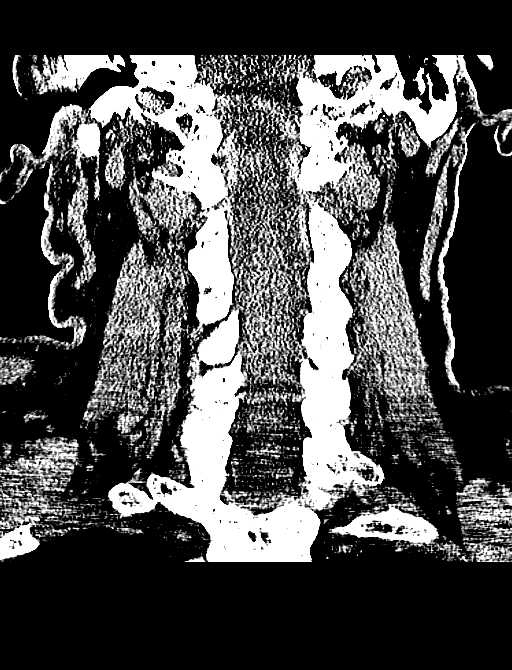

[Series 9: axial bone 2.0 · axial · 0.21mm/px · 1 of 85 slices shown]
[im 10/85  bone]
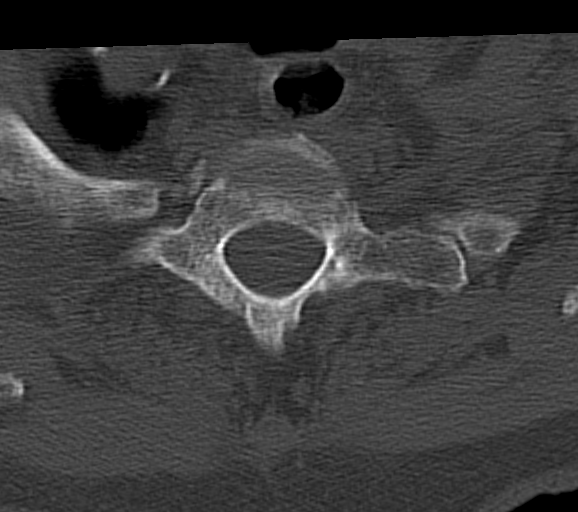

[14 of 47 positions shown; findings below may reference images not displayed]

FINDINGS: CT HEAD FINDINGS

There is no evidence of acute infarction, mass lesion, or intra- or
extra-axial hemorrhage on CT.

Prominence of the ventricles and sulci reflects moderate cortical
volume loss. Mild cerebellar atrophy is noted. Diffuse
periventricular and subcortical white matter change likely reflects
small vessel ischemic microangiopathy.

The brainstem and fourth ventricle are within normal limits. The
basal ganglia are unremarkable in appearance. The cerebral
hemispheres demonstrate grossly normal gray-white differentiation.
No mass effect or midline shift is seen.

There is no evidence of fracture; visualized osseous structures are
unremarkable in appearance. The visualized portions of the orbits
are within normal limits. There is mild partial opacification of the
right mastoid air cells. Mucosal thickening is noted at the
maxillary sinuses, more prominent on the right. The remaining
paranasal sinuses and left mastoid air cells are well-aerated. No
significant soft tissue abnormalities are seen.

CT CERVICAL SPINE FINDINGS

There is no evidence of fracture or subluxation. Vertebral bodies
demonstrate normal height and alignment. Intervertebral disc spaces
are preserved. Prevertebral soft tissues are within normal limits.
Scattered small anterior and posterior disc osteophyte complexes are
seen along the cervical spine.

The visualized portions of the thyroid gland are unremarkable in
appearance. The minimally visualized lung apices are clear. No
significant soft tissue abnormalities are seen. Calcification is
noted at the carotid bifurcations bilaterally.
IMPRESSION: 1. No evidence of traumatic intracranial injury or fracture.
2. No evidence of fracture or subluxation along the cervical spine.
3. Moderate cortical volume loss and diffuse small vessel ischemic
microangiopathy.
4. Mild partial opacification of the right mastoid air cells.
Mucosal thickening at the maxillary sinuses, more prominent on the
right.
5. Calcification at the carotid bifurcations bilaterally. Carotid
ultrasound could be considered for further evaluation, when and as
deemed clinically appropriate.

## 2020-09-13 ENCOUNTER — Encounter: Payer: Self-pay | Admitting: Internal Medicine
